# Patient Record
Sex: Female | Born: 1950
Health system: Southern US, Community
[De-identification: ages and names within clinical notes are randomized; demographics above are authoritative.]

## PROBLEM LIST (undated history)

## (undated) DIAGNOSIS — G5600 Carpal tunnel syndrome, unspecified upper limb: Secondary | ICD-10-CM

## (undated) DIAGNOSIS — C50919 Malignant neoplasm of unspecified site of unspecified female breast: Secondary | ICD-10-CM

## (undated) DIAGNOSIS — Z9889 Other specified postprocedural states: Secondary | ICD-10-CM

## (undated) DIAGNOSIS — I10 Essential (primary) hypertension: Secondary | ICD-10-CM

## (undated) DIAGNOSIS — R7301 Impaired fasting glucose: Secondary | ICD-10-CM

## (undated) DIAGNOSIS — Z923 Personal history of irradiation: Secondary | ICD-10-CM

## (undated) DIAGNOSIS — Z9221 Personal history of antineoplastic chemotherapy: Secondary | ICD-10-CM

## (undated) DIAGNOSIS — Z22322 Carrier or suspected carrier of Methicillin resistant Staphylococcus aureus: Secondary | ICD-10-CM

## (undated) DIAGNOSIS — E785 Hyperlipidemia, unspecified: Secondary | ICD-10-CM

## (undated) DIAGNOSIS — M7989 Other specified soft tissue disorders: Secondary | ICD-10-CM

## (undated) DIAGNOSIS — Z853 Personal history of malignant neoplasm of breast: Secondary | ICD-10-CM

## (undated) DIAGNOSIS — R112 Nausea with vomiting, unspecified: Secondary | ICD-10-CM

## (undated) DIAGNOSIS — R413 Other amnesia: Secondary | ICD-10-CM

## (undated) DIAGNOSIS — M1990 Unspecified osteoarthritis, unspecified site: Secondary | ICD-10-CM

## (undated) DIAGNOSIS — M8580 Other specified disorders of bone density and structure, unspecified site: Secondary | ICD-10-CM

## (undated) HISTORY — DX: Malignant neoplasm of unspecified site of unspecified female breast: C50.919

## (undated) HISTORY — DX: Hyperlipidemia, unspecified: E78.5

## (undated) HISTORY — DX: Carpal tunnel syndrome, unspecified upper limb: G56.00

## (undated) HISTORY — DX: Essential (primary) hypertension: I10

## (undated) HISTORY — DX: Other specified disorders of bone density and structure, unspecified site: M85.80

## (undated) HISTORY — DX: Personal history of malignant neoplasm of breast: Z85.3

## (undated) HISTORY — DX: Other amnesia: R41.3

## (undated) HISTORY — PX: BREAST LUMPECTOMY: SHX2

## (undated) HISTORY — DX: Impaired fasting glucose: R73.01

---

## 1995-06-18 DIAGNOSIS — Z923 Personal history of irradiation: Secondary | ICD-10-CM

## 1995-06-18 DIAGNOSIS — Z9221 Personal history of antineoplastic chemotherapy: Secondary | ICD-10-CM

## 1995-06-18 DIAGNOSIS — Z9889 Other specified postprocedural states: Secondary | ICD-10-CM

## 1995-06-18 DIAGNOSIS — R112 Nausea with vomiting, unspecified: Secondary | ICD-10-CM

## 1995-06-18 HISTORY — DX: Nausea with vomiting, unspecified: R11.2

## 1995-06-18 HISTORY — DX: Personal history of irradiation: Z92.3

## 1995-06-18 HISTORY — DX: Personal history of antineoplastic chemotherapy: Z92.21

## 1995-06-18 HISTORY — DX: Other specified postprocedural states: Z98.890

## 1997-09-26 ENCOUNTER — Other Ambulatory Visit: Admission: RE | Admit: 1997-09-26 | Discharge: 1997-09-26 | Payer: Self-pay | Admitting: Obstetrics and Gynecology

## 1998-06-17 HISTORY — PX: KNEE SURGERY: SHX244

## 1998-09-20 ENCOUNTER — Other Ambulatory Visit: Admission: RE | Admit: 1998-09-20 | Discharge: 1998-09-20 | Payer: Self-pay | Admitting: Obstetrics and Gynecology

## 1998-09-28 ENCOUNTER — Other Ambulatory Visit: Admission: RE | Admit: 1998-09-28 | Discharge: 1998-09-28 | Payer: Self-pay | Admitting: Obstetrics and Gynecology

## 1999-02-07 ENCOUNTER — Other Ambulatory Visit: Admission: RE | Admit: 1999-02-07 | Discharge: 1999-02-07 | Payer: Self-pay | Admitting: Orthopedic Surgery

## 1999-04-09 ENCOUNTER — Other Ambulatory Visit: Admission: RE | Admit: 1999-04-09 | Discharge: 1999-04-09 | Payer: Self-pay | Admitting: Obstetrics and Gynecology

## 1999-06-26 ENCOUNTER — Encounter: Payer: Self-pay | Admitting: Oncology

## 1999-06-26 ENCOUNTER — Encounter: Admission: RE | Admit: 1999-06-26 | Discharge: 1999-06-26 | Payer: Self-pay | Admitting: Oncology

## 1999-10-11 ENCOUNTER — Other Ambulatory Visit: Admission: RE | Admit: 1999-10-11 | Discharge: 1999-10-11 | Payer: Self-pay | Admitting: Obstetrics and Gynecology

## 2000-06-17 HISTORY — PX: DILATION AND CURETTAGE, DIAGNOSTIC / THERAPEUTIC: SUR384

## 2000-06-30 ENCOUNTER — Encounter: Payer: Self-pay | Admitting: Oncology

## 2000-06-30 ENCOUNTER — Encounter: Admission: RE | Admit: 2000-06-30 | Discharge: 2000-06-30 | Payer: Self-pay | Admitting: Oncology

## 2000-10-13 ENCOUNTER — Other Ambulatory Visit: Admission: RE | Admit: 2000-10-13 | Discharge: 2000-10-13 | Payer: Self-pay | Admitting: Obstetrics and Gynecology

## 2000-10-15 ENCOUNTER — Encounter (INDEPENDENT_AMBULATORY_CARE_PROVIDER_SITE_OTHER): Payer: Self-pay

## 2000-10-15 ENCOUNTER — Other Ambulatory Visit: Admission: RE | Admit: 2000-10-15 | Discharge: 2000-10-15 | Payer: Self-pay | Admitting: Obstetrics and Gynecology

## 2000-11-18 ENCOUNTER — Ambulatory Visit (HOSPITAL_COMMUNITY): Admission: RE | Admit: 2000-11-18 | Discharge: 2000-11-18 | Payer: Self-pay | Admitting: Obstetrics and Gynecology

## 2000-11-18 ENCOUNTER — Encounter (INDEPENDENT_AMBULATORY_CARE_PROVIDER_SITE_OTHER): Payer: Self-pay

## 2001-07-02 ENCOUNTER — Encounter: Payer: Self-pay | Admitting: Oncology

## 2001-07-02 ENCOUNTER — Encounter: Admission: RE | Admit: 2001-07-02 | Discharge: 2001-07-02 | Payer: Self-pay | Admitting: Oncology

## 2001-10-15 ENCOUNTER — Other Ambulatory Visit: Admission: RE | Admit: 2001-10-15 | Discharge: 2001-10-15 | Payer: Self-pay | Admitting: Obstetrics and Gynecology

## 2002-07-05 ENCOUNTER — Encounter: Admission: RE | Admit: 2002-07-05 | Discharge: 2002-07-05 | Payer: Self-pay | Admitting: Obstetrics and Gynecology

## 2002-07-05 ENCOUNTER — Encounter: Payer: Self-pay | Admitting: Obstetrics and Gynecology

## 2003-06-18 HISTORY — PX: COLONOSCOPY: SHX174

## 2003-07-07 ENCOUNTER — Encounter: Admission: RE | Admit: 2003-07-07 | Discharge: 2003-07-07 | Payer: Self-pay | Admitting: Obstetrics and Gynecology

## 2004-07-20 ENCOUNTER — Encounter: Admission: RE | Admit: 2004-07-20 | Discharge: 2004-07-20 | Payer: Self-pay | Admitting: Obstetrics and Gynecology

## 2005-05-03 ENCOUNTER — Ambulatory Visit: Payer: Self-pay | Admitting: Internal Medicine

## 2005-07-30 ENCOUNTER — Encounter: Admission: RE | Admit: 2005-07-30 | Discharge: 2005-07-30 | Payer: Self-pay | Admitting: Obstetrics and Gynecology

## 2006-02-04 ENCOUNTER — Ambulatory Visit: Payer: Self-pay | Admitting: Internal Medicine

## 2006-02-14 ENCOUNTER — Ambulatory Visit: Payer: Self-pay | Admitting: Family Medicine

## 2006-03-03 ENCOUNTER — Ambulatory Visit: Payer: Self-pay | Admitting: Internal Medicine

## 2006-04-02 ENCOUNTER — Ambulatory Visit: Payer: Self-pay | Admitting: Internal Medicine

## 2006-08-01 ENCOUNTER — Encounter: Admission: RE | Admit: 2006-08-01 | Discharge: 2006-08-01 | Payer: Self-pay | Admitting: Obstetrics and Gynecology

## 2006-10-23 ENCOUNTER — Ambulatory Visit: Payer: Self-pay | Admitting: Internal Medicine

## 2006-10-27 ENCOUNTER — Encounter: Payer: Self-pay | Admitting: Internal Medicine

## 2007-02-05 ENCOUNTER — Encounter: Payer: Self-pay | Admitting: Internal Medicine

## 2007-04-01 ENCOUNTER — Ambulatory Visit: Payer: Self-pay | Admitting: Internal Medicine

## 2007-08-03 ENCOUNTER — Encounter: Admission: RE | Admit: 2007-08-03 | Discharge: 2007-08-03 | Payer: Self-pay | Admitting: Internal Medicine

## 2007-10-12 ENCOUNTER — Ambulatory Visit: Payer: Self-pay | Admitting: Internal Medicine

## 2007-10-12 DIAGNOSIS — R030 Elevated blood-pressure reading, without diagnosis of hypertension: Secondary | ICD-10-CM | POA: Insufficient documentation

## 2008-03-10 ENCOUNTER — Ambulatory Visit: Payer: Self-pay | Admitting: Internal Medicine

## 2008-07-15 ENCOUNTER — Encounter: Payer: Self-pay | Admitting: Internal Medicine

## 2008-07-15 ENCOUNTER — Ambulatory Visit: Payer: Self-pay | Admitting: Internal Medicine

## 2008-07-25 ENCOUNTER — Encounter (INDEPENDENT_AMBULATORY_CARE_PROVIDER_SITE_OTHER): Payer: Self-pay | Admitting: *Deleted

## 2008-08-09 ENCOUNTER — Encounter: Admission: RE | Admit: 2008-08-09 | Discharge: 2008-08-09 | Payer: Self-pay | Admitting: Obstetrics and Gynecology

## 2008-08-24 ENCOUNTER — Ambulatory Visit: Payer: Self-pay | Admitting: Internal Medicine

## 2008-08-24 DIAGNOSIS — M1990 Unspecified osteoarthritis, unspecified site: Secondary | ICD-10-CM

## 2008-08-24 DIAGNOSIS — E785 Hyperlipidemia, unspecified: Secondary | ICD-10-CM | POA: Insufficient documentation

## 2008-08-24 DIAGNOSIS — M8580 Other specified disorders of bone density and structure, unspecified site: Secondary | ICD-10-CM | POA: Insufficient documentation

## 2008-08-24 DIAGNOSIS — Z853 Personal history of malignant neoplasm of breast: Secondary | ICD-10-CM | POA: Insufficient documentation

## 2008-08-24 HISTORY — DX: Unspecified osteoarthritis, unspecified site: M19.90

## 2008-08-29 ENCOUNTER — Encounter (INDEPENDENT_AMBULATORY_CARE_PROVIDER_SITE_OTHER): Payer: Self-pay | Admitting: *Deleted

## 2008-09-06 ENCOUNTER — Encounter (INDEPENDENT_AMBULATORY_CARE_PROVIDER_SITE_OTHER): Payer: Self-pay | Admitting: *Deleted

## 2008-09-06 ENCOUNTER — Ambulatory Visit: Payer: Self-pay | Admitting: Internal Medicine

## 2008-09-06 LAB — CONVERTED CEMR LAB: OCCULT 3: NEGATIVE

## 2009-04-09 ENCOUNTER — Emergency Department (HOSPITAL_COMMUNITY): Admission: EM | Admit: 2009-04-09 | Discharge: 2009-04-09 | Payer: Self-pay | Admitting: Emergency Medicine

## 2009-04-12 ENCOUNTER — Ambulatory Visit: Payer: Self-pay | Admitting: Internal Medicine

## 2009-04-12 DIAGNOSIS — M542 Cervicalgia: Secondary | ICD-10-CM

## 2009-04-12 DIAGNOSIS — M545 Low back pain, unspecified: Secondary | ICD-10-CM | POA: Insufficient documentation

## 2009-04-12 HISTORY — DX: Low back pain, unspecified: M54.50

## 2009-04-12 HISTORY — DX: Cervicalgia: M54.2

## 2009-08-10 ENCOUNTER — Encounter: Admission: RE | Admit: 2009-08-10 | Discharge: 2009-08-10 | Payer: Self-pay | Admitting: Obstetrics and Gynecology

## 2009-08-14 ENCOUNTER — Encounter: Admission: RE | Admit: 2009-08-14 | Discharge: 2009-09-20 | Payer: Self-pay | Admitting: Family Medicine

## 2010-06-17 DIAGNOSIS — M7989 Other specified soft tissue disorders: Secondary | ICD-10-CM

## 2010-06-17 DIAGNOSIS — R7301 Impaired fasting glucose: Secondary | ICD-10-CM

## 2010-06-17 DIAGNOSIS — Z22322 Carrier or suspected carrier of Methicillin resistant Staphylococcus aureus: Secondary | ICD-10-CM

## 2010-06-17 HISTORY — DX: Carrier or suspected carrier of methicillin resistant Staphylococcus aureus: Z22.322

## 2010-06-17 HISTORY — PX: BREAST LUMPECTOMY: SHX2

## 2010-06-17 HISTORY — PX: AXILLARY NODE DISSECTION: SHX1211

## 2010-06-17 HISTORY — DX: Impaired fasting glucose: R73.01

## 2010-06-17 HISTORY — DX: Other specified soft tissue disorders: M79.89

## 2010-06-17 HISTORY — PX: BREAST SURGERY: SHX581

## 2010-06-17 HISTORY — PX: BREAST BIOPSY: SHX20

## 2010-07-10 ENCOUNTER — Other Ambulatory Visit: Payer: Self-pay | Admitting: Obstetrics and Gynecology

## 2010-07-15 LAB — CONVERTED CEMR LAB
AST: 26 units/L (ref 0–37)
Albumin: 4.3 g/dL (ref 3.5–5.2)
Alkaline Phosphatase: 82 units/L (ref 39–117)
BUN: 16 mg/dL (ref 6–23)
Chloride: 103 meq/L (ref 96–112)
Direct LDL: 141.1 mg/dL
Eosinophils Relative: 2 % (ref 0.0–5.0)
Glucose, Bld: 98 mg/dL (ref 70–99)
HDL: 80.5 mg/dL (ref >39.0)
Lymphocytes Relative: 22.3 % (ref 12.0–46.0)
MCHC: 34.7 g/dL (ref 30.0–36.0)
MCV: 92.3 fL (ref 78.0–100.0)
Neutro Abs: 4.6 10*3/uL (ref 1.4–7.7)
Neutrophils Relative %: 68.1 % (ref 43.0–77.0)
Platelets: 231 10*3/uL (ref 150–400)
Potassium: 4.1 meq/L (ref 3.5–5.1)
Sodium: 141 meq/L (ref 135–145)
Total CHOL/HDL Ratio: 3.2
Total Protein: 7 g/dL (ref 6.0–8.3)
VLDL: 16 mg/dL (ref 0–40)
WBC: 6.7 10*3/uL (ref 4.5–10.5)

## 2010-08-02 ENCOUNTER — Other Ambulatory Visit: Payer: Self-pay | Admitting: Obstetrics and Gynecology

## 2010-08-02 DIAGNOSIS — N6320 Unspecified lump in the left breast, unspecified quadrant: Secondary | ICD-10-CM

## 2010-08-03 ENCOUNTER — Other Ambulatory Visit: Payer: Self-pay | Admitting: Obstetrics and Gynecology

## 2010-08-13 ENCOUNTER — Other Ambulatory Visit: Payer: Self-pay | Admitting: Diagnostic Radiology

## 2010-08-13 ENCOUNTER — Ambulatory Visit
Admission: RE | Admit: 2010-08-13 | Discharge: 2010-08-13 | Disposition: A | Discharge status: Home / Self Care | Payer: BC Managed Care – PPO | Source: Ambulatory Visit | Attending: Obstetrics and Gynecology | Admitting: Obstetrics and Gynecology

## 2010-08-13 ENCOUNTER — Other Ambulatory Visit: Payer: Self-pay | Admitting: Obstetrics and Gynecology

## 2010-08-13 ENCOUNTER — Encounter: Payer: Self-pay | Admitting: Internal Medicine

## 2010-08-13 DIAGNOSIS — N6320 Unspecified lump in the left breast, unspecified quadrant: Secondary | ICD-10-CM

## 2010-08-14 ENCOUNTER — Other Ambulatory Visit: Payer: Self-pay | Admitting: Obstetrics and Gynecology

## 2010-08-14 DIAGNOSIS — C50912 Malignant neoplasm of unspecified site of left female breast: Secondary | ICD-10-CM

## 2010-08-16 HISTORY — PX: AXILLARY NODE DISSECTION: SHX1211

## 2010-08-17 ENCOUNTER — Ambulatory Visit
Admission: RE | Admit: 2010-08-17 | Discharge: 2010-08-17 | Disposition: A | Discharge status: Home / Self Care | Payer: BC Managed Care – PPO | Source: Ambulatory Visit | Attending: Obstetrics and Gynecology | Admitting: Obstetrics and Gynecology

## 2010-08-17 ENCOUNTER — Other Ambulatory Visit (HOSPITAL_COMMUNITY): Payer: Self-pay | Admitting: Surgery

## 2010-08-17 DIAGNOSIS — C50919 Malignant neoplasm of unspecified site of unspecified female breast: Secondary | ICD-10-CM

## 2010-08-17 DIAGNOSIS — C50912 Malignant neoplasm of unspecified site of left female breast: Secondary | ICD-10-CM

## 2010-08-17 MED ORDER — GADOBENATE DIMEGLUMINE 529 MG/ML IV SOLN
10.0000 mL | Freq: Once | INTRAVENOUS | Status: AC | PRN
  Administered 2010-08-17: 10 mL via INTRAVENOUS

## 2010-08-20 ENCOUNTER — Encounter: Payer: BC Managed Care – PPO | Admitting: Oncology

## 2010-08-20 ENCOUNTER — Other Ambulatory Visit: Payer: Self-pay | Admitting: Obstetrics and Gynecology

## 2010-08-20 ENCOUNTER — Encounter (HOSPITAL_BASED_OUTPATIENT_CLINIC_OR_DEPARTMENT_OTHER): Payer: BC Managed Care – PPO | Admitting: Oncology

## 2010-08-20 DIAGNOSIS — C50919 Malignant neoplasm of unspecified site of unspecified female breast: Secondary | ICD-10-CM

## 2010-08-20 DIAGNOSIS — R928 Other abnormal and inconclusive findings on diagnostic imaging of breast: Secondary | ICD-10-CM

## 2010-08-20 DIAGNOSIS — C773 Secondary and unspecified malignant neoplasm of axilla and upper limb lymph nodes: Secondary | ICD-10-CM

## 2010-08-22 ENCOUNTER — Ambulatory Visit
Admission: RE | Admit: 2010-08-22 | Discharge: 2010-08-22 | Disposition: A | Discharge status: Home / Self Care | Payer: BC Managed Care – PPO | Source: Ambulatory Visit | Attending: Obstetrics and Gynecology | Admitting: Obstetrics and Gynecology

## 2010-08-22 ENCOUNTER — Other Ambulatory Visit: Payer: Self-pay | Admitting: Obstetrics and Gynecology

## 2010-08-22 DIAGNOSIS — R928 Other abnormal and inconclusive findings on diagnostic imaging of breast: Secondary | ICD-10-CM

## 2010-08-23 ENCOUNTER — Encounter (HOSPITAL_COMMUNITY)
Admission: RE | Admit: 2010-08-23 | Discharge: 2010-08-23 | Disposition: A | Discharge status: Home / Self Care | Payer: BC Managed Care – PPO | Source: Ambulatory Visit | Attending: Surgery | Admitting: Surgery

## 2010-08-23 ENCOUNTER — Encounter (HOSPITAL_COMMUNITY): Payer: Self-pay

## 2010-08-23 DIAGNOSIS — C50919 Malignant neoplasm of unspecified site of unspecified female breast: Secondary | ICD-10-CM | POA: Insufficient documentation

## 2010-08-23 LAB — GLUCOSE, CAPILLARY: Glucose-Capillary: 108 mg/dL — ABNORMAL HIGH (ref 70–99)

## 2010-08-23 MED ORDER — FLUDEOXYGLUCOSE F - 18 (FDG) INJECTION
17.1000 | Freq: Once | INTRAVENOUS | Status: AC | PRN
  Administered 2010-08-23: 17.1 via INTRAVENOUS

## 2010-08-30 ENCOUNTER — Other Ambulatory Visit: Payer: Self-pay | Admitting: Diagnostic Radiology

## 2010-08-30 ENCOUNTER — Ambulatory Visit
Admission: RE | Admit: 2010-08-30 | Discharge: 2010-08-30 | Disposition: A | Discharge status: Home / Self Care | Payer: BC Managed Care – PPO | Source: Ambulatory Visit | Attending: Obstetrics and Gynecology | Admitting: Obstetrics and Gynecology

## 2010-08-30 DIAGNOSIS — R928 Other abnormal and inconclusive findings on diagnostic imaging of breast: Secondary | ICD-10-CM

## 2010-09-03 ENCOUNTER — Other Ambulatory Visit: Payer: BC Managed Care – PPO

## 2010-09-06 ENCOUNTER — Encounter (HOSPITAL_BASED_OUTPATIENT_CLINIC_OR_DEPARTMENT_OTHER): Payer: BC Managed Care – PPO | Admitting: Oncology

## 2010-09-06 DIAGNOSIS — C773 Secondary and unspecified malignant neoplasm of axilla and upper limb lymph nodes: Secondary | ICD-10-CM

## 2010-09-06 DIAGNOSIS — C50919 Malignant neoplasm of unspecified site of unspecified female breast: Secondary | ICD-10-CM

## 2010-09-10 ENCOUNTER — Other Ambulatory Visit: Payer: Self-pay | Admitting: Surgery

## 2010-09-10 ENCOUNTER — Encounter (HOSPITAL_COMMUNITY): Payer: BC Managed Care – PPO

## 2010-09-10 LAB — COMPREHENSIVE METABOLIC PANEL
BUN: 10 mg/dL (ref 6–23)
Calcium: 9.5 mg/dL (ref 8.4–10.5)
Creatinine, Ser: 0.77 mg/dL (ref 0.4–1.2)
Glucose, Bld: 111 mg/dL — ABNORMAL HIGH (ref 70–99)
Total Protein: 7.1 g/dL (ref 6.0–8.3)

## 2010-09-10 LAB — CBC
HCT: 44.3 % (ref 36.0–46.0)
MCHC: 34.3 g/dL (ref 30.0–36.0)
MCV: 90.4 fL (ref 78.0–100.0)
RDW: 12.4 % (ref 11.5–15.5)

## 2010-09-10 LAB — URINALYSIS, ROUTINE W REFLEX MICROSCOPIC
Bilirubin Urine: NEGATIVE
Glucose, UA: NEGATIVE mg/dL
Ketones, ur: NEGATIVE mg/dL
Nitrite: NEGATIVE
Protein, ur: NEGATIVE mg/dL
pH: 7.5 (ref 5.0–8.0)

## 2010-09-10 LAB — CANCER ANTIGEN 27.29: CA 27.29: 44 U/mL — ABNORMAL HIGH (ref 0–39)

## 2010-09-10 LAB — DIFFERENTIAL
Eosinophils Absolute: 0.1 10*3/uL (ref 0.0–0.7)
Eosinophils Relative: 1 % (ref 0–5)
Lymphocytes Relative: 18 % (ref 12–46)
Lymphs Abs: 1.4 10*3/uL (ref 0.7–4.0)
Monocytes Absolute: 0.6 10*3/uL (ref 0.1–1.0)

## 2010-09-10 LAB — SURGICAL PCR SCREEN
MRSA, PCR: NEGATIVE
Staphylococcus aureus: POSITIVE — AB

## 2010-09-11 ENCOUNTER — Encounter: Payer: Self-pay | Admitting: Internal Medicine

## 2010-09-13 ENCOUNTER — Ambulatory Visit (HOSPITAL_COMMUNITY)
Admission: RE | Admit: 2010-09-13 | Discharge: 2010-09-14 | Disposition: A | Discharge status: Home / Self Care | Payer: BC Managed Care – PPO | Source: Ambulatory Visit | Attending: Surgery | Admitting: Surgery

## 2010-09-13 ENCOUNTER — Other Ambulatory Visit: Payer: Self-pay | Admitting: Surgery

## 2010-09-13 DIAGNOSIS — Z01810 Encounter for preprocedural cardiovascular examination: Secondary | ICD-10-CM | POA: Insufficient documentation

## 2010-09-13 DIAGNOSIS — Z01812 Encounter for preprocedural laboratory examination: Secondary | ICD-10-CM | POA: Insufficient documentation

## 2010-09-13 DIAGNOSIS — C50919 Malignant neoplasm of unspecified site of unspecified female breast: Secondary | ICD-10-CM | POA: Insufficient documentation

## 2010-09-13 DIAGNOSIS — C773 Secondary and unspecified malignant neoplasm of axilla and upper limb lymph nodes: Secondary | ICD-10-CM | POA: Insufficient documentation

## 2010-09-14 NOTE — Op Note (Signed)
NAMELILLAR, Shirley NO.:  [id number]  MEDICAL RECORD NO.:  [id number]           PATIENT TYPE:  O  LOCATION:  1309                         FACILITY:  [facility]  PHYSICIAN:  Currie Paris, M.D.DATE OF BIRTH:  Dec 16, 1950  DATE OF PROCEDURE:  09/13/2010 DATE OF DISCHARGE:                              OPERATIVE REPORT   PREOPERATIVE DIAGNOSIS:  Left axillary metastases secondary to breast cancer.  POSTOPERATIVE DIAGNOSIS:  Left axillary metastases secondary to breast cancer.  PROCEDURE:  Complete left axillary node lymph node dissection.  SURGEON:  Currie Paris, M.D.  ASSISTANT:  Anselm Pancoast. Zachery Dakins, M.D.  ANESTHESIA:  General.  CLINICAL HISTORY:  This is a 60 year old lady who underwent lumpectomy and chemoradiation for a left breast cancer in 1997.  She did not have any axillary therapy other than what area the radiation fields covered.  She recently presented with a mass in the left axilla and workup has shown that she has metastatic axillary disease with no other obvious disease present.  After discussion with the patient and consultation with her oncologist, she elected to proceed with a total left axillary node lymph node dissection.  DESCRIPTION OF PROCEDURE:  I saw the patient in the holding area and reviewed the plans for the procedure with the patient.  She had no further questions.  I initialed the left side as the operative side.  The patient was taken to the operating room.  After satisfactory general (LMA) anesthesia had been obtained, the left axillary area was prepped and draped as a sterile field and the time-out done.  There was an axillary mass palpable somewhat anterior and low.  I made a transverse axillary incision just inferior to the mass.  I divided the tissue down to the chest wall, coming around the mass and freeing it up. It did not appear to be stuck to either skin or subcutaneous tissue, but did not really have  a good capsule around it.  I could palpate other enlarged nodes.  I opened up the tissue and freed the extra tissue off the inferior aspect of the latissimus dorsi muscle.  I then opened up the clavipectoral fascia and began removing the axillary tissue from that area.  When I got up to where I could see the nerve and blood supply to the pectoralis, I began looking for the axillary vein and found that.  I then was able to strip the axillary contents out from medial to lateral and then superior to inferior.  I identified and preserved both the thoracodorsal and long thoracic nerves, taking out the intervening tissue.  The lateral attachments were divided with cautery.  I used clips for most of the blood vessels.  I did take the second intercostal nerve at its origin coming out of the chest wall and using the clips for hemostasis.  Once the axillary contents were completely removed, I irrigated and made sure everything was dry.  I pinched the long thoracic and thoracodorsal nerves to make sure they still functioned and they seemed to work okay.  I put a 19-Blake drain in and secured it with  2-0 nylon.  I injected 0.25% plain Marcaine around the two nerves and then into the skin and subcutaneous tissues.  I closed with 3-0 Vicryl, 4-0 Monocryl, subcuticular and Dermabond.  The patient tolerated the procedure well and there were no complications.  All counts were correct.     Currie Paris, M.D.     CJS/MEDQ  D:  09/13/2010  T:  09/13/2010  Job:  [id number]  cc:   Titus Dubin. Alwyn Ren, MD,FACP,FCCP [phone] [address] [place] [address]  G. Rolm Baptise, M.D. Fax: [phone]  Electronically Signed by Cyndia Bent M.D. on 09/14/2010 07:25:58 AM

## 2010-09-19 ENCOUNTER — Ambulatory Visit: Payer: BC Managed Care – PPO | Attending: Radiation Oncology | Admitting: Radiation Oncology

## 2010-09-19 DIAGNOSIS — Z923 Personal history of irradiation: Secondary | ICD-10-CM | POA: Insufficient documentation

## 2010-09-19 DIAGNOSIS — C773 Secondary and unspecified malignant neoplasm of axilla and upper limb lymph nodes: Secondary | ICD-10-CM | POA: Insufficient documentation

## 2010-09-19 DIAGNOSIS — Z9221 Personal history of antineoplastic chemotherapy: Secondary | ICD-10-CM | POA: Insufficient documentation

## 2010-09-19 DIAGNOSIS — Z853 Personal history of malignant neoplasm of breast: Secondary | ICD-10-CM | POA: Insufficient documentation

## 2010-09-19 DIAGNOSIS — Z170 Estrogen receptor positive status [ER+]: Secondary | ICD-10-CM | POA: Insufficient documentation

## 2010-09-24 ENCOUNTER — Encounter (HOSPITAL_BASED_OUTPATIENT_CLINIC_OR_DEPARTMENT_OTHER): Payer: BC Managed Care – PPO | Admitting: Oncology

## 2010-09-24 DIAGNOSIS — C773 Secondary and unspecified malignant neoplasm of axilla and upper limb lymph nodes: Secondary | ICD-10-CM

## 2010-09-24 DIAGNOSIS — C50919 Malignant neoplasm of unspecified site of unspecified female breast: Secondary | ICD-10-CM

## 2010-10-04 ENCOUNTER — Encounter (HOSPITAL_BASED_OUTPATIENT_CLINIC_OR_DEPARTMENT_OTHER): Payer: BC Managed Care – PPO | Admitting: Oncology

## 2010-10-04 DIAGNOSIS — C773 Secondary and unspecified malignant neoplasm of axilla and upper limb lymph nodes: Secondary | ICD-10-CM

## 2010-10-04 DIAGNOSIS — C50919 Malignant neoplasm of unspecified site of unspecified female breast: Secondary | ICD-10-CM

## 2010-10-16 ENCOUNTER — Other Ambulatory Visit: Payer: Self-pay | Admitting: Obstetrics and Gynecology

## 2010-11-02 NOTE — Op Note (Signed)
[facility]  Patient:    Shirley Melendez, Shirley Melendez                       MRN: [id number] Proc. Date: 11/18/00 Adm. Date:  [id number] Attending:  Rosalee Kaufman                           Operative Report  PREOPERATIVE DIAGNOSIS:  History of vaginal discharge associated with an endometrial prolapsed polyp and history of tamoxifen therapy for four and one-half years.  POSTOPERATIVE DIAGNOSIS:  History of vaginal discharge associated with an endometrial prolapsed polyp and history of tamoxifen therapy for four and one-half years.  OPERATION:  Dilatation and curettage.  SURGEON:  Harl Bowie, M.D.  ANESTHESIA:  MAC with local.  FINDINGS AND PROCEDURE:  Patient prepped and draped in usual fashion for vaginal procedure.  The patient was examined and found uterus normal size in a posterior position, adnexa clear.  The endometrial polyp had been previously removed, and the cervix appeared normal.  Following this, the cervix was grasped with a single-tooth tenaculum.  The cervix was sounded to 2-1/2 inches in a posterior manner.  Cervix was dilated and the cavity entered with sharp curet.  Only a small amount of tissue was obtained.  No additional tissue was obtained with the Randall Stone forceps and a serrated curet.  Blood loss during the procedure was minimal.  The patient tolerated the procedure well and was sent to the recovery room in good condition. DD:  11/18/00 TD:  11/18/00 Job: [id number]

## 2010-11-07 ENCOUNTER — Other Ambulatory Visit: Payer: Self-pay | Admitting: Oncology

## 2010-11-07 ENCOUNTER — Encounter (HOSPITAL_BASED_OUTPATIENT_CLINIC_OR_DEPARTMENT_OTHER): Payer: BC Managed Care – PPO | Admitting: Oncology

## 2010-11-07 DIAGNOSIS — C50919 Malignant neoplasm of unspecified site of unspecified female breast: Secondary | ICD-10-CM

## 2010-11-07 DIAGNOSIS — Z5111 Encounter for antineoplastic chemotherapy: Secondary | ICD-10-CM

## 2010-11-07 DIAGNOSIS — C773 Secondary and unspecified malignant neoplasm of axilla and upper limb lymph nodes: Secondary | ICD-10-CM

## 2010-11-07 LAB — CBC WITH DIFFERENTIAL/PLATELET
Eosinophils Absolute: 0.1 10*3/uL (ref 0.0–0.5)
MCV: 91.6 fL (ref 79.5–101.0)
MONO#: 0.6 10*3/uL (ref 0.1–0.9)
MONO%: 6.8 % (ref 0.0–14.0)
NEUT#: 6 10*3/uL (ref 1.5–6.5)
RBC: 4.89 10*6/uL (ref 3.70–5.45)
RDW: 12.1 % (ref 11.2–14.5)
WBC: 8.3 10*3/uL (ref 3.9–10.3)

## 2010-11-07 LAB — COMPREHENSIVE METABOLIC PANEL
Albumin: 4.1 g/dL (ref 3.5–5.2)
Alkaline Phosphatase: 90 U/L (ref 39–117)
Glucose, Bld: 121 mg/dL — ABNORMAL HIGH (ref 70–99)
Potassium: 3.7 mEq/L (ref 3.5–5.3)
Sodium: 138 mEq/L (ref 135–145)
Total Protein: 7 g/dL (ref 6.0–8.3)

## 2010-11-08 ENCOUNTER — Encounter (HOSPITAL_BASED_OUTPATIENT_CLINIC_OR_DEPARTMENT_OTHER): Payer: BC Managed Care – PPO | Admitting: Oncology

## 2010-11-08 DIAGNOSIS — C773 Secondary and unspecified malignant neoplasm of axilla and upper limb lymph nodes: Secondary | ICD-10-CM

## 2010-11-08 DIAGNOSIS — C50919 Malignant neoplasm of unspecified site of unspecified female breast: Secondary | ICD-10-CM

## 2010-11-08 DIAGNOSIS — Z5189 Encounter for other specified aftercare: Secondary | ICD-10-CM

## 2010-11-16 ENCOUNTER — Encounter (HOSPITAL_BASED_OUTPATIENT_CLINIC_OR_DEPARTMENT_OTHER): Payer: BC Managed Care – PPO | Admitting: Oncology

## 2010-11-16 ENCOUNTER — Other Ambulatory Visit: Payer: Self-pay | Admitting: Oncology

## 2010-11-16 DIAGNOSIS — C773 Secondary and unspecified malignant neoplasm of axilla and upper limb lymph nodes: Secondary | ICD-10-CM

## 2010-11-16 DIAGNOSIS — C50919 Malignant neoplasm of unspecified site of unspecified female breast: Secondary | ICD-10-CM

## 2010-11-16 LAB — CBC WITH DIFFERENTIAL/PLATELET
BASO%: 0.4 % (ref 0.0–2.0)
EOS%: 0.2 % (ref 0.0–7.0)
LYMPH%: 9.7 % — ABNORMAL LOW (ref 14.0–49.7)
MCHC: 35.3 g/dL (ref 31.5–36.0)
MCV: 88.2 fL (ref 79.5–101.0)
MONO%: 5.5 % (ref 0.0–14.0)
Platelets: 271 10*3/uL (ref 145–400)
RBC: 4.59 10*6/uL (ref 3.70–5.45)
WBC: 30.1 10*3/uL — ABNORMAL HIGH (ref 3.9–10.3)

## 2010-11-26 ENCOUNTER — Encounter (HOSPITAL_BASED_OUTPATIENT_CLINIC_OR_DEPARTMENT_OTHER): Payer: BC Managed Care – PPO | Admitting: Oncology

## 2010-11-26 ENCOUNTER — Other Ambulatory Visit: Payer: Self-pay | Admitting: Oncology

## 2010-11-26 DIAGNOSIS — C50919 Malignant neoplasm of unspecified site of unspecified female breast: Secondary | ICD-10-CM

## 2010-11-26 DIAGNOSIS — C773 Secondary and unspecified malignant neoplasm of axilla and upper limb lymph nodes: Secondary | ICD-10-CM

## 2010-11-26 DIAGNOSIS — Z5111 Encounter for antineoplastic chemotherapy: Secondary | ICD-10-CM

## 2010-11-26 LAB — COMPREHENSIVE METABOLIC PANEL
ALT: 20 U/L (ref 0–35)
AST: 15 U/L (ref 0–37)
CO2: 25 mEq/L (ref 19–32)
Calcium: 9.5 mg/dL (ref 8.4–10.5)
Chloride: 102 mEq/L (ref 96–112)
Sodium: 136 mEq/L (ref 135–145)
Total Bilirubin: 0.5 mg/dL (ref 0.3–1.2)
Total Protein: 6.2 g/dL (ref 6.0–8.3)

## 2010-11-26 LAB — CBC WITH DIFFERENTIAL/PLATELET
BASO%: 0.1 % (ref 0.0–2.0)
MCHC: 35.2 g/dL (ref 31.5–36.0)
MONO#: 0.8 10*3/uL (ref 0.1–0.9)
RBC: 4.47 10*6/uL (ref 3.70–5.45)
WBC: 15.2 10*3/uL — ABNORMAL HIGH (ref 3.9–10.3)
lymph#: 1.2 10*3/uL (ref 0.9–3.3)

## 2010-11-27 ENCOUNTER — Encounter (HOSPITAL_BASED_OUTPATIENT_CLINIC_OR_DEPARTMENT_OTHER): Payer: BC Managed Care – PPO | Admitting: Oncology

## 2010-11-27 DIAGNOSIS — Z5189 Encounter for other specified aftercare: Secondary | ICD-10-CM

## 2010-11-27 DIAGNOSIS — C773 Secondary and unspecified malignant neoplasm of axilla and upper limb lymph nodes: Secondary | ICD-10-CM

## 2010-11-27 DIAGNOSIS — C50919 Malignant neoplasm of unspecified site of unspecified female breast: Secondary | ICD-10-CM

## 2010-12-17 ENCOUNTER — Other Ambulatory Visit: Payer: Self-pay | Admitting: Oncology

## 2010-12-17 ENCOUNTER — Encounter (HOSPITAL_BASED_OUTPATIENT_CLINIC_OR_DEPARTMENT_OTHER): Payer: BC Managed Care – PPO | Admitting: Oncology

## 2010-12-17 DIAGNOSIS — C50919 Malignant neoplasm of unspecified site of unspecified female breast: Secondary | ICD-10-CM

## 2010-12-17 DIAGNOSIS — C773 Secondary and unspecified malignant neoplasm of axilla and upper limb lymph nodes: Secondary | ICD-10-CM

## 2010-12-17 DIAGNOSIS — Z5111 Encounter for antineoplastic chemotherapy: Secondary | ICD-10-CM

## 2010-12-17 DIAGNOSIS — Z5189 Encounter for other specified aftercare: Secondary | ICD-10-CM

## 2010-12-17 LAB — CBC WITH DIFFERENTIAL/PLATELET
Basophils Absolute: 0 10*3/uL (ref 0.0–0.1)
Eosinophils Absolute: 0 10*3/uL (ref 0.0–0.5)
MCV: 89.7 fL (ref 79.5–101.0)
MONO%: 9.2 % (ref 0.0–14.0)
NEUT%: 82.4 % — ABNORMAL HIGH (ref 38.4–76.8)
RDW: 14.6 % — ABNORMAL HIGH (ref 11.2–14.5)
WBC: 17 10*3/uL — ABNORMAL HIGH (ref 3.9–10.3)
lymph#: 1.4 10*3/uL (ref 0.9–3.3)

## 2010-12-17 LAB — COMPREHENSIVE METABOLIC PANEL
Albumin: 3.8 g/dL (ref 3.5–5.2)
BUN: 19 mg/dL (ref 6–23)
Calcium: 9.9 mg/dL (ref 8.4–10.5)
Chloride: 103 mEq/L (ref 96–112)
Glucose, Bld: 102 mg/dL — ABNORMAL HIGH (ref 70–99)
Potassium: 3.5 mEq/L (ref 3.5–5.3)

## 2010-12-18 ENCOUNTER — Encounter (HOSPITAL_BASED_OUTPATIENT_CLINIC_OR_DEPARTMENT_OTHER): Payer: BC Managed Care – PPO | Admitting: Oncology

## 2010-12-18 DIAGNOSIS — C50919 Malignant neoplasm of unspecified site of unspecified female breast: Secondary | ICD-10-CM

## 2010-12-18 DIAGNOSIS — C773 Secondary and unspecified malignant neoplasm of axilla and upper limb lymph nodes: Secondary | ICD-10-CM

## 2010-12-18 DIAGNOSIS — Z5111 Encounter for antineoplastic chemotherapy: Secondary | ICD-10-CM

## 2010-12-24 ENCOUNTER — Ambulatory Visit: Payer: BC Managed Care – PPO | Attending: Internal Medicine | Admitting: Physical Therapy

## 2010-12-24 DIAGNOSIS — I890 Lymphedema, not elsewhere classified: Secondary | ICD-10-CM | POA: Insufficient documentation

## 2010-12-24 DIAGNOSIS — IMO0001 Reserved for inherently not codable concepts without codable children: Secondary | ICD-10-CM | POA: Insufficient documentation

## 2010-12-26 ENCOUNTER — Ambulatory Visit: Payer: BC Managed Care – PPO | Admitting: Physical Therapy

## 2010-12-31 ENCOUNTER — Ambulatory Visit: Payer: BC Managed Care – PPO | Admitting: Physical Therapy

## 2011-01-02 ENCOUNTER — Ambulatory Visit: Payer: BC Managed Care – PPO | Admitting: Physical Therapy

## 2011-01-04 ENCOUNTER — Ambulatory Visit: Payer: BC Managed Care – PPO | Admitting: Physical Therapy

## 2011-01-07 ENCOUNTER — Encounter (HOSPITAL_BASED_OUTPATIENT_CLINIC_OR_DEPARTMENT_OTHER): Payer: BC Managed Care – PPO | Admitting: Oncology

## 2011-01-07 ENCOUNTER — Other Ambulatory Visit: Payer: Self-pay | Admitting: Oncology

## 2011-01-07 DIAGNOSIS — Z5111 Encounter for antineoplastic chemotherapy: Secondary | ICD-10-CM

## 2011-01-07 DIAGNOSIS — C773 Secondary and unspecified malignant neoplasm of axilla and upper limb lymph nodes: Secondary | ICD-10-CM

## 2011-01-07 DIAGNOSIS — C50919 Malignant neoplasm of unspecified site of unspecified female breast: Secondary | ICD-10-CM

## 2011-01-07 LAB — CBC WITH DIFFERENTIAL/PLATELET
BASO%: 0 % (ref 0.0–2.0)
Basophils Absolute: 0 10*3/uL (ref 0.0–0.1)
EOS%: 0 % (ref 0.0–7.0)
Eosinophils Absolute: 0 10*3/uL (ref 0.0–0.5)
HCT: 34.5 % — ABNORMAL LOW (ref 34.8–46.6)
HGB: 11.8 g/dL (ref 11.6–15.9)
LYMPH%: 6.1 % — ABNORMAL LOW (ref 14.0–49.7)
MCH: 32.1 pg (ref 25.1–34.0)
MCHC: 34.2 g/dL (ref 31.5–36.0)
MCV: 93.7 fL (ref 79.5–101.0)
MONO#: 1.1 10*3/uL — ABNORMAL HIGH (ref 0.1–0.9)
MONO%: 7.6 % (ref 0.0–14.0)
NEUT#: 12.4 10*3/uL — ABNORMAL HIGH (ref 1.5–6.5)
NEUT%: 86.3 % — ABNORMAL HIGH (ref 38.4–76.8)
Platelets: 394 10*3/uL (ref 145–400)
RBC: 3.68 10*6/uL — ABNORMAL LOW (ref 3.70–5.45)
RDW: 17.2 % — ABNORMAL HIGH (ref 11.2–14.5)
WBC: 14.4 10*3/uL — ABNORMAL HIGH (ref 3.9–10.3)
lymph#: 0.9 10*3/uL (ref 0.9–3.3)

## 2011-01-07 LAB — COMPREHENSIVE METABOLIC PANEL
ALT: 23 U/L (ref 0–35)
AST: 32 U/L (ref 0–37)
Albumin: 3.5 g/dL (ref 3.5–5.2)
Alkaline Phosphatase: 125 U/L — ABNORMAL HIGH (ref 39–117)
BUN: 16 mg/dL (ref 6–23)
CO2: 25 mEq/L (ref 19–32)
Calcium: 9.7 mg/dL (ref 8.4–10.5)
Chloride: 101 mEq/L (ref 96–112)
Creatinine, Ser: 0.6 mg/dL (ref 0.50–1.10)
Glucose, Bld: 110 mg/dL — ABNORMAL HIGH (ref 70–99)
Potassium: 4 mEq/L (ref 3.5–5.3)
Sodium: 137 mEq/L (ref 135–145)
Total Bilirubin: 0.5 mg/dL (ref 0.3–1.2)
Total Protein: 7.1 g/dL (ref 6.0–8.3)

## 2011-01-08 ENCOUNTER — Encounter (HOSPITAL_BASED_OUTPATIENT_CLINIC_OR_DEPARTMENT_OTHER): Payer: BC Managed Care – PPO | Admitting: Oncology

## 2011-01-08 DIAGNOSIS — C50919 Malignant neoplasm of unspecified site of unspecified female breast: Secondary | ICD-10-CM

## 2011-01-08 DIAGNOSIS — C773 Secondary and unspecified malignant neoplasm of axilla and upper limb lymph nodes: Secondary | ICD-10-CM

## 2011-01-08 DIAGNOSIS — IMO0002 Reserved for concepts with insufficient information to code with codable children: Secondary | ICD-10-CM

## 2011-01-09 ENCOUNTER — Ambulatory Visit: Payer: BC Managed Care – PPO | Admitting: Physical Therapy

## 2011-01-14 ENCOUNTER — Ambulatory Visit: Payer: BC Managed Care – PPO | Admitting: Physical Therapy

## 2011-01-15 ENCOUNTER — Encounter (INDEPENDENT_AMBULATORY_CARE_PROVIDER_SITE_OTHER): Payer: Self-pay | Admitting: Surgery

## 2011-01-15 ENCOUNTER — Ambulatory Visit (INDEPENDENT_AMBULATORY_CARE_PROVIDER_SITE_OTHER): Payer: BC Managed Care – PPO | Admitting: Surgery

## 2011-01-15 VITALS — BP 120/78 | HR 100 | Temp 98.2°F

## 2011-01-15 DIAGNOSIS — Z853 Personal history of malignant neoplasm of breast: Secondary | ICD-10-CM

## 2011-01-15 NOTE — Progress Notes (Signed)
01/15/2011  Shirley Melendez is a 60 y.o.female who presents for routine followup of her Left breast cancerdiagnosed in 1997 and treated with Lumpectomy and radiation.She developed an axillary recurrence this year andAnd she underwent an axillary dissection for that. She has been working with physical therapy on her left shoulder and has got some benefit from that. Overall I think she feels like she is doing okay.  PFSH She has had no significant changes since the last visit here.  ROS There have been no significant changes since the last visit here  General: The patient is alert, oriented, generally healty appearing, NAD. Mood and affect are normal.  Breasts : The right breast was okay. The left breast shows surgical changes in the medial aspect from her lumpectomy many years ago. The axillary area has healed okay although there is very minimal cording. Lymphatics: She has no axillary or supraclavicular adenopathy on either side.  Extremities: Full ROM of the surgical side with no lymphedema noted.  Data Reviewed: No new data Impression: Stable exam Plan: Revisit in three months

## 2011-01-15 NOTE — Patient Instructions (Signed)
I will see you back for breast cancer followup in about three months, after he had finished her radiation therapy. Call me for any questions or problems during that time.

## 2011-01-16 ENCOUNTER — Ambulatory Visit: Payer: BC Managed Care – PPO | Attending: Internal Medicine | Admitting: Physical Therapy

## 2011-01-16 ENCOUNTER — Ambulatory Visit
Admission: RE | Admit: 2011-01-16 | Discharge: 2011-01-16 | Disposition: A | Discharge status: Home / Self Care | Payer: BC Managed Care – PPO | Source: Ambulatory Visit | Attending: Radiation Oncology | Admitting: Radiation Oncology

## 2011-01-16 DIAGNOSIS — I890 Lymphedema, not elsewhere classified: Secondary | ICD-10-CM | POA: Insufficient documentation

## 2011-01-16 DIAGNOSIS — IMO0001 Reserved for inherently not codable concepts without codable children: Secondary | ICD-10-CM | POA: Insufficient documentation

## 2011-01-16 DIAGNOSIS — Z510 Encounter for antineoplastic radiation therapy: Secondary | ICD-10-CM | POA: Insufficient documentation

## 2011-01-16 DIAGNOSIS — R609 Edema, unspecified: Secondary | ICD-10-CM | POA: Insufficient documentation

## 2011-01-16 DIAGNOSIS — L539 Erythematous condition, unspecified: Secondary | ICD-10-CM | POA: Insufficient documentation

## 2011-01-16 DIAGNOSIS — C50919 Malignant neoplasm of unspecified site of unspecified female breast: Secondary | ICD-10-CM | POA: Insufficient documentation

## 2011-01-21 ENCOUNTER — Encounter (HOSPITAL_BASED_OUTPATIENT_CLINIC_OR_DEPARTMENT_OTHER): Payer: BC Managed Care – PPO | Admitting: Oncology

## 2011-01-21 ENCOUNTER — Ambulatory Visit: Payer: BC Managed Care – PPO | Admitting: Physical Therapy

## 2011-01-21 DIAGNOSIS — C50919 Malignant neoplasm of unspecified site of unspecified female breast: Secondary | ICD-10-CM

## 2011-01-21 DIAGNOSIS — C773 Secondary and unspecified malignant neoplasm of axilla and upper limb lymph nodes: Secondary | ICD-10-CM

## 2011-01-21 DIAGNOSIS — I890 Lymphedema, not elsewhere classified: Secondary | ICD-10-CM

## 2011-01-23 ENCOUNTER — Ambulatory Visit (HOSPITAL_COMMUNITY)
Admission: RE | Admit: 2011-01-23 | Discharge: 2011-01-23 | Disposition: A | Discharge status: Home / Self Care | Payer: BC Managed Care – PPO | Source: Ambulatory Visit | Attending: Oncology | Admitting: Oncology

## 2011-01-23 ENCOUNTER — Encounter: Payer: BC Managed Care – PPO | Admitting: Physical Therapy

## 2011-01-23 DIAGNOSIS — M7989 Other specified soft tissue disorders: Secondary | ICD-10-CM

## 2011-01-28 ENCOUNTER — Other Ambulatory Visit: Payer: Self-pay | Admitting: Oncology

## 2011-01-28 ENCOUNTER — Ambulatory Visit: Payer: BC Managed Care – PPO | Admitting: Physical Therapy

## 2011-01-28 ENCOUNTER — Encounter (HOSPITAL_BASED_OUTPATIENT_CLINIC_OR_DEPARTMENT_OTHER): Payer: BC Managed Care – PPO | Admitting: Oncology

## 2011-01-28 DIAGNOSIS — C773 Secondary and unspecified malignant neoplasm of axilla and upper limb lymph nodes: Secondary | ICD-10-CM

## 2011-01-28 DIAGNOSIS — C50919 Malignant neoplasm of unspecified site of unspecified female breast: Secondary | ICD-10-CM

## 2011-01-28 DIAGNOSIS — Z5111 Encounter for antineoplastic chemotherapy: Secondary | ICD-10-CM

## 2011-01-28 LAB — CBC WITH DIFFERENTIAL/PLATELET
BASO%: 0.1 % (ref 0.0–2.0)
Basophils Absolute: 0 10*3/uL (ref 0.0–0.1)
EOS%: 0.1 % (ref 0.0–7.0)
HGB: 12.1 g/dL (ref 11.6–15.9)
MCH: 32.8 pg (ref 25.1–34.0)
RDW: 17.5 % — ABNORMAL HIGH (ref 11.2–14.5)
lymph#: 0.7 10*3/uL — ABNORMAL LOW (ref 0.9–3.3)

## 2011-01-30 ENCOUNTER — Ambulatory Visit: Payer: BC Managed Care – PPO | Admitting: Physical Therapy

## 2011-02-01 ENCOUNTER — Ambulatory Visit: Payer: BC Managed Care – PPO | Admitting: Physical Therapy

## 2011-02-04 ENCOUNTER — Ambulatory Visit: Payer: BC Managed Care – PPO | Admitting: Physical Therapy

## 2011-02-07 ENCOUNTER — Ambulatory Visit: Payer: BC Managed Care – PPO | Admitting: Physical Therapy

## 2011-02-11 ENCOUNTER — Ambulatory Visit: Payer: BC Managed Care – PPO | Admitting: Physical Therapy

## 2011-02-14 ENCOUNTER — Ambulatory Visit: Payer: BC Managed Care – PPO | Admitting: Physical Therapy

## 2011-02-20 ENCOUNTER — Ambulatory Visit: Payer: BC Managed Care – PPO | Attending: Internal Medicine | Admitting: Physical Therapy

## 2011-02-20 DIAGNOSIS — IMO0001 Reserved for inherently not codable concepts without codable children: Secondary | ICD-10-CM | POA: Insufficient documentation

## 2011-02-20 DIAGNOSIS — I890 Lymphedema, not elsewhere classified: Secondary | ICD-10-CM | POA: Insufficient documentation

## 2011-02-21 ENCOUNTER — Ambulatory Visit: Payer: BC Managed Care – PPO | Admitting: Physical Therapy

## 2011-02-22 ENCOUNTER — Encounter (HOSPITAL_BASED_OUTPATIENT_CLINIC_OR_DEPARTMENT_OTHER): Payer: BC Managed Care – PPO | Admitting: Oncology

## 2011-02-22 DIAGNOSIS — C773 Secondary and unspecified malignant neoplasm of axilla and upper limb lymph nodes: Secondary | ICD-10-CM

## 2011-02-22 DIAGNOSIS — C50919 Malignant neoplasm of unspecified site of unspecified female breast: Secondary | ICD-10-CM

## 2011-02-25 ENCOUNTER — Ambulatory Visit: Payer: BC Managed Care – PPO | Admitting: Physical Therapy

## 2011-02-27 ENCOUNTER — Encounter: Payer: BC Managed Care – PPO | Admitting: Physical Therapy

## 2011-04-02 ENCOUNTER — Encounter (INDEPENDENT_AMBULATORY_CARE_PROVIDER_SITE_OTHER): Payer: Self-pay | Admitting: Surgery

## 2011-04-24 ENCOUNTER — Telehealth: Payer: Self-pay | Admitting: *Deleted

## 2011-04-25 NOTE — Telephone Encounter (Signed)
Patient to bring consent with her to fu appointment with dr. Dayton Scrape

## 2011-04-30 ENCOUNTER — Encounter (INDEPENDENT_AMBULATORY_CARE_PROVIDER_SITE_OTHER): Payer: Self-pay | Admitting: Surgery

## 2011-04-30 ENCOUNTER — Ambulatory Visit (INDEPENDENT_AMBULATORY_CARE_PROVIDER_SITE_OTHER): Payer: BC Managed Care – PPO | Admitting: Surgery

## 2011-04-30 ENCOUNTER — Encounter (INDEPENDENT_AMBULATORY_CARE_PROVIDER_SITE_OTHER): Payer: Self-pay | Admitting: General Surgery

## 2011-04-30 VITALS — BP 134/86 | HR 68 | Temp 96.9°F | Resp 16 | Ht 61.0 in | Wt 108.8 lb

## 2011-04-30 DIAGNOSIS — Z853 Personal history of malignant neoplasm of breast: Secondary | ICD-10-CM

## 2011-04-30 NOTE — Progress Notes (Signed)
04/30/2011  Benjamine Mola Dung is a 60 y.o.female who presents for routine followup of her Left breast cancer diagnosed in 1997 and treated with Lumpectomy and radiation.She developed an axillary recurrence this year and she underwent an axillary dissection for that. She has been doing shoulder exercises and thinks she has almost normal mobility. She has developed some lymphedema but controlling it with a sleeve She has finished radiation and chemo and is now on Anastrozole PFSH She has had no significant changes since the last visit here.  ROS There have been no significant changes since the last visit here  General: The patient is alert, oriented, generally healty appearing, NAD. Mood and affect are normal.  Breasts: The right breast was okay. The left breast shows surgical changes in the medial aspect from her lumpectomy many years ago. The axillary area is healed. mimimal cording.  Lymphatics: She has no axillary or supraclavicular adenopathy on either side.  Extremities: Full ROM of the surgical side with mild lymphedema noted Data Reviewed: Oncology notes  Impression: Stable exam Plan: Revisit in six months

## 2011-04-30 NOTE — Patient Instructions (Signed)
Continue your exercises - you are doing well with them in terms of mobility of your shoulder. I will see you again in about six months, but if you have any problems come back sooner

## 2011-05-02 ENCOUNTER — Encounter: Payer: Self-pay | Admitting: Radiation Oncology

## 2011-05-07 ENCOUNTER — Ambulatory Visit
Admission: RE | Admit: 2011-05-07 | Discharge: 2011-05-07 | Disposition: A | Discharge status: Home / Self Care | Payer: BC Managed Care – PPO | Source: Ambulatory Visit | Attending: Radiation Oncology | Admitting: Radiation Oncology

## 2011-05-07 VITALS — BP 143/93 | HR 99 | Temp 97.5°F | Resp 18 | Wt 108.1 lb

## 2011-05-07 DIAGNOSIS — Z853 Personal history of malignant neoplasm of breast: Secondary | ICD-10-CM

## 2011-05-07 NOTE — Progress Notes (Signed)
CC:   Ladene Artist, M.D. Currie Paris, M.D. Titus Dubin. [place], MD,FACP,FCCP Carrington Clamp, M.D.  NARRATIVE:  Shirley Melendez returns today approximately 6 weeks following completion of radiation therapy to the left axilla in the management of her recurrent carcinoma of the left breast.  She is without complaints today.  She will see Dr. Truett Perna for a followup visit on December 3rd, and also Dr. Jamey Ripa for a followup visit in 6 months.  She started Arimidex on October 9th.  She continues to use her left arm sleeve.  PHYSICAL EXAMINATION:  Nodes: Without palpable cervical, supraclavicular, or axillary adenopathy.  Chest: Lungs clear.  Breasts: Without masses or lesions.  Extremities: She wears a left arm sleeve and glove.  There is no edema.  IMPRESSION:  Satisfactory progress.  I have not scheduled Ms. Irving for a formal followup visit.  I believe that she is scheduled for followup mammography at the breast center this spring.    ______________________________ Maryln Gottron, M.D. RJM/MEDQ  D:  05/07/2011  T:  05/07/2011  Job:  [id number]

## 2011-05-07 NOTE — Progress Notes (Signed)
SKIN LOOKS GOOD WITHOUT REDNESS OR DESQUAMATION.  SHE DOES HAVE LYMPHADEMA IN LEFT ARM, WEARING SLEEVE AND GLOVE.  SHE SAYS THIS HELPS

## 2011-05-20 ENCOUNTER — Ambulatory Visit (HOSPITAL_BASED_OUTPATIENT_CLINIC_OR_DEPARTMENT_OTHER): Payer: BC Managed Care – PPO | Admitting: Oncology

## 2011-05-20 ENCOUNTER — Other Ambulatory Visit: Payer: Self-pay | Admitting: *Deleted

## 2011-05-20 ENCOUNTER — Telehealth: Payer: Self-pay | Admitting: Oncology

## 2011-05-20 VITALS — BP 152/92 | HR 94 | Temp 98.0°F | Ht 61.0 in | Wt 109.1 lb

## 2011-05-20 DIAGNOSIS — I890 Lymphedema, not elsewhere classified: Secondary | ICD-10-CM

## 2011-05-20 DIAGNOSIS — C773 Secondary and unspecified malignant neoplasm of axilla and upper limb lymph nodes: Secondary | ICD-10-CM

## 2011-05-20 DIAGNOSIS — C50919 Malignant neoplasm of unspecified site of unspecified female breast: Secondary | ICD-10-CM

## 2011-05-20 DIAGNOSIS — R209 Unspecified disturbances of skin sensation: Secondary | ICD-10-CM

## 2011-05-20 DIAGNOSIS — Z853 Personal history of malignant neoplasm of breast: Secondary | ICD-10-CM

## 2011-05-20 MED ORDER — OXYCODONE-ACETAMINOPHEN 5-325 MG PO TABS: 1.0000 | ORAL_TABLET | Freq: Four times a day (QID) | ORAL | Status: DC | PRN
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
## 2011-05-20 NOTE — Telephone Encounter (Signed)
gve the pt her feb 2013 appt calendar

## 2011-06-18 DIAGNOSIS — G5600 Carpal tunnel syndrome, unspecified upper limb: Secondary | ICD-10-CM

## 2011-06-18 HISTORY — DX: Carpal tunnel syndrome, unspecified upper limb: G56.00

## 2011-07-24 ENCOUNTER — Other Ambulatory Visit: Payer: Self-pay | Admitting: Obstetrics and Gynecology

## 2011-07-24 DIAGNOSIS — Z853 Personal history of malignant neoplasm of breast: Secondary | ICD-10-CM

## 2011-08-12 ENCOUNTER — Ambulatory Visit (HOSPITAL_BASED_OUTPATIENT_CLINIC_OR_DEPARTMENT_OTHER): Payer: BC Managed Care – PPO | Admitting: Oncology

## 2011-08-12 ENCOUNTER — Telehealth: Payer: Self-pay | Admitting: Oncology

## 2011-08-12 VITALS — BP 146/92 | HR 99 | Temp 98.4°F | Ht 61.0 in | Wt 109.1 lb

## 2011-08-12 DIAGNOSIS — Z853 Personal history of malignant neoplasm of breast: Secondary | ICD-10-CM
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
## 2011-08-12 NOTE — Telephone Encounter (Signed)
Called Diane @ [facility], left message regarding referral due to neuropathy. Filled referral note and taken to HIM for referral

## 2011-08-16 ENCOUNTER — Telehealth: Payer: Self-pay | Admitting: *Deleted

## 2011-08-16 NOTE — Telephone Encounter (Signed)
Has appointment on 09/04/11 at 11am. Patient is aware.

## 2011-09-03 ENCOUNTER — Ambulatory Visit
Admission: RE | Admit: 2011-09-03 | Discharge: 2011-09-03 | Disposition: A | Discharge status: Home / Self Care | Payer: BC Managed Care – PPO | Source: Ambulatory Visit | Attending: Obstetrics and Gynecology | Admitting: Obstetrics and Gynecology

## 2011-09-03 DIAGNOSIS — Z853 Personal history of malignant neoplasm of breast: Secondary | ICD-10-CM

## 2011-09-04 ENCOUNTER — Other Ambulatory Visit: Payer: Self-pay | Admitting: Obstetrics and Gynecology

## 2011-09-04 ENCOUNTER — Ambulatory Visit
Admission: RE | Admit: 2011-09-04 | Discharge: 2011-09-04 | Disposition: A | Discharge status: Home / Self Care | Payer: BC Managed Care – PPO | Source: Ambulatory Visit | Attending: Obstetrics and Gynecology | Admitting: Obstetrics and Gynecology

## 2011-09-04 DIAGNOSIS — Z853 Personal history of malignant neoplasm of breast: Secondary | ICD-10-CM

## 2011-09-27 ENCOUNTER — Ambulatory Visit (INDEPENDENT_AMBULATORY_CARE_PROVIDER_SITE_OTHER): Payer: BC Managed Care – PPO | Admitting: Internal Medicine

## 2011-09-27 ENCOUNTER — Encounter: Payer: Self-pay | Admitting: Internal Medicine

## 2011-09-27 VITALS — BP 148/86 | HR 103 | Temp 98.1°F | Resp 12 | Ht 60.03 in | Wt 108.2 lb

## 2011-09-27 DIAGNOSIS — M899 Disorder of bone, unspecified: Secondary | ICD-10-CM

## 2011-09-27 DIAGNOSIS — H9190 Unspecified hearing loss, unspecified ear: Secondary | ICD-10-CM

## 2011-09-27 DIAGNOSIS — R9431 Abnormal electrocardiogram [ECG] [EKG]: Secondary | ICD-10-CM

## 2011-09-27 DIAGNOSIS — M949 Disorder of cartilage, unspecified: Secondary | ICD-10-CM

## 2011-09-27 DIAGNOSIS — H9193 Unspecified hearing loss, bilateral: Secondary | ICD-10-CM

## 2011-09-27 DIAGNOSIS — Z0000 Encounter for general adult medical examination without abnormal findings: Secondary | ICD-10-CM

## 2011-09-27 LAB — LIPID PANEL
Cholesterol: 258 mg/dL — ABNORMAL HIGH (ref 0–200)
HDL: 105.6 mg/dL (ref >39.00)
Triglycerides: 79 mg/dL (ref 0.0–149.0)

## 2011-09-27 LAB — CBC WITH DIFFERENTIAL/PLATELET
Basophils Absolute: 0 10*3/uL (ref 0.0–0.1)
Eosinophils Absolute: 0.1 10*3/uL (ref 0.0–0.7)
Lymphocytes Relative: 21.6 % (ref 12.0–46.0)
Lymphs Abs: 1.3 10*3/uL (ref 0.7–4.0)
MCHC: 33.9 g/dL (ref 30.0–36.0)
Monocytes Relative: 8.2 % (ref 3.0–12.0)
Platelets: 226 10*3/uL (ref 150.0–400.0)
RDW: 12.9 % (ref 11.5–14.6)

## 2011-09-27 LAB — BASIC METABOLIC PANEL
BUN: 17 mg/dL (ref 6–23)
CO2: 29 mEq/L (ref 19–32)
Calcium: 9.3 mg/dL (ref 8.4–10.5)
GFR: 94.8 mL/min (ref >60.00)
Glucose, Bld: 98 mg/dL (ref 70–99)

## 2011-09-27 LAB — HEMOGLOBIN A1C: Hgb A1c MFr Bld: 5.6 % (ref 4.6–6.5)

## 2011-09-27 LAB — TSH: TSH: 1.32 u[IU]/mL (ref 0.35–5.50)

## 2011-09-27 NOTE — Progress Notes (Signed)
  Subjective:    Patient ID: Shirley Melendez, female    DOB: 1951/06/07, 61 y.o.   MRN: [id number]  HPI    Review of Systems     Objective:   Physical Exam hearing to normal voice was normal but she appears to have decreased auditory acuity to whisper at 6 feet        Assessment & Plan:  Audiology referral will be made

## 2011-09-27 NOTE — Progress Notes (Signed)
  Subjective:    Patient ID: Shirley Melendez, female    DOB: [birth year], 61 y.o.   MRN: [id number]  HPI Shirley Melendez is here for a physical;acute issues are denoted below.      Review of Systems Her Oncologist is monitoring labs on a regular basis.  She is being followed by Neurologist for bilateral carpal tunnel syndrome; a NCT/EMG was performed.  She does have lymphedema of left upper extremity.  She is seeing a [facility] for chronic cervical pain as well as chronic  low back symptoms.  Review of the old record reveals a fasting glucose of 110 on 01/07/11; A1c monitor would be appropriate.  Advanced cholesterol testing reveals she has no long-term cardiovascular risk unless her LDL were greater than 160.  She does have osteopenia; she is due for followup bone density. Vitamin D level should be checked as well.     Objective:   Physical Exam Gen.: Healthy and well-nourished in appearance. Alert, appropriate and cooperative throughout exam. Head: Normocephalic without obvious abnormalities  Eyes: No corneal or conjunctival inflammation noted. Pupils equal round reactive to light and accommodation. Fundal exam is benign without hemorrhages, exudate, papilledema. Extraocular motion intact. Vision grossly normal. Lids lie low w/o true ptosis Ears: External  ear exam reveals no significant lesions or deformities. Canals clear .TMs normal. Hearing is grossly normal bilaterally. Nose: External nasal exam reveals no deformity or inflammation. Nasal mucosa are pink and moist. No lesions or exudates noted.  Mouth: Oral mucosa and oropharynx reveal no lesions or exudates. Teeth in good repair. Neck: No deformities, masses, or tenderness noted. Range of motion & Thyroid normal. Lungs: Normal respiratory effort; chest expands symmetrically. Lungs are clear to auscultation without rales, wheezes, or increased work of breathing. Heart: Normal rate and rhythm. Normal S1 and S2. No gallop, click, or rub. S4  with slurring ;no  murmur. Abdomen: Bowel sounds normal; abdomen soft and nontender. No masses, organomegaly or hernias noted. Genitalia: Dr Shirley Melendez, Gyn Musculoskeletal/extremities: No deformity or scoliosis noted of  the thoracic or lumbar spine. No clubbing, cyanosis, edema noted. Range of motion  normal .Tone & strength  Normal.Joints: OA hand changes Nails: onycholysis. RUE  in a soft brace. Lymphedema "glove" left upper extremity. Vascular: Carotid, radial artery, dorsalis pedis and  posterior tibial pulses are full and equal. No bruits present. Neurologic: Alert and oriented x3. Deep tendon reflexes symmetrical and 1.5+.          Skin: Intact without suspicious lesions or rashes. Lymph: No cervical, axillary lymphadenopathy present. Psych: Mood and affect are normal. Normally interactive                                                                                         Assessment & Plan:  #1 comprehensive physical exam; no acute findings #2 see Problem List with Assessments & Recommendations Plan: see Orders

## 2011-09-27 NOTE — Patient Instructions (Signed)
Preventive Health Care: Exercise  30-45  minutes a day, 3-4 days a week. Walking is especially valuable in preventing Osteoporosis. Eat a low-fat diet with lots of fruits and vegetables, up to 7-9 servings per day. Consume less than 30 grams of sugar per day from foods & drinks with High Fructose Corn Syrup as # 1,2,3 or #4 on label. Health Care Power of Attorney places you in charge of your health care  decisions. Verify this is  in place.  Please try to go on My Chart within the next 24 hours to allow me to release the results directly to you.

## 2011-09-27 NOTE — Progress Notes (Signed)
Addended byMarga Melnick F on: 09/27/2011 10:55 AM   Modules accepted: Orders

## 2011-09-30 ENCOUNTER — Telehealth: Payer: Self-pay | Admitting: *Deleted

## 2011-09-30 NOTE — Telephone Encounter (Signed)
Calling to report "flare up" of her lymphedema in arm. Has spoken with therapist on phone. Asking for referral back to lymphedema clniic for them to re-evaluate her arm.

## 2011-10-02 ENCOUNTER — Other Ambulatory Visit: Payer: Self-pay | Admitting: *Deleted

## 2011-10-02 ENCOUNTER — Telehealth: Payer: Self-pay | Admitting: *Deleted

## 2011-10-02 DIAGNOSIS — Z853 Personal history of malignant neoplasm of breast: Secondary | ICD-10-CM

## 2011-10-02 NOTE — Telephone Encounter (Signed)
Per Dr. Truett Perna: OK to refer pt back to lymphedema clinic. Called pt, informed her that referral to lymphedema clinic was sent, she verbalized understanding.

## 2011-10-07 ENCOUNTER — Ambulatory Visit: Payer: BC Managed Care – PPO | Attending: Oncology | Admitting: Physical Therapy

## 2011-10-07 DIAGNOSIS — I890 Lymphedema, not elsewhere classified: Secondary | ICD-10-CM | POA: Insufficient documentation

## 2011-10-07 DIAGNOSIS — IMO0001 Reserved for inherently not codable concepts without codable children: Secondary | ICD-10-CM | POA: Insufficient documentation

## 2011-10-14 ENCOUNTER — Ambulatory Visit (INDEPENDENT_AMBULATORY_CARE_PROVIDER_SITE_OTHER)
Admission: RE | Admit: 2011-10-14 | Discharge: 2011-10-14 | Disposition: A | Discharge status: Home / Self Care | Payer: BC Managed Care – PPO | Source: Ambulatory Visit

## 2011-10-14 DIAGNOSIS — Z0000 Encounter for general adult medical examination without abnormal findings: Secondary | ICD-10-CM

## 2011-10-14 DIAGNOSIS — M810 Age-related osteoporosis without current pathological fracture: Secondary | ICD-10-CM

## 2011-10-17 ENCOUNTER — Ambulatory Visit: Payer: BC Managed Care – PPO | Attending: Oncology | Admitting: Physical Therapy

## 2011-10-17 DIAGNOSIS — IMO0001 Reserved for inherently not codable concepts without codable children: Secondary | ICD-10-CM | POA: Insufficient documentation

## 2011-10-17 DIAGNOSIS — I890 Lymphedema, not elsewhere classified: Secondary | ICD-10-CM | POA: Insufficient documentation

## 2011-10-22 ENCOUNTER — Other Ambulatory Visit: Payer: Self-pay | Admitting: Orthopedic Surgery

## 2011-10-23 ENCOUNTER — Encounter: Payer: BC Managed Care – PPO | Admitting: Physical Therapy

## 2011-10-24 ENCOUNTER — Encounter (HOSPITAL_BASED_OUTPATIENT_CLINIC_OR_DEPARTMENT_OTHER): Payer: Self-pay | Admitting: *Deleted

## 2011-10-24 NOTE — Progress Notes (Signed)
Patient stopped taking aspirin and ibuprofen on 5/6, vit. E on 5/9.  Advised not to restart these until after surgery.  No meds to take am of surgery.  Time and date confirmed.  Patient had a positive nasal swab for MRSA in 2012 prior to surgery, antibiotic protocol followed.  No further issues. Patient wears a compression sleeve daily on left arm due to lymphadema.  Told patient to wear that morning like normal and we would remove if necessary.  Did have an axillary dissection in 2012, so limited use of left arm is required.

## 2011-10-28 NOTE — H&P (Signed)
Shirley Melendez is an 61 y.o. female.   Chief Complaint: c/o chronic and progressive right hand numbness and tingling and STS symptoms index finger HPI: . Shirley Melendez is an unfortunate 61 year old woman who has a history of left breast cancer. She underwent left breast lumpectomy in 1997 followed by 5 years of Tamoxifen therapy. 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 Dr. Jamey Ripa was her attending general surgeon. She now has significant left upper extremity lymphedema and is chronically wearing a compression garment. She is on Arimidex s/p radiation and chemotherapy. She had numbness in her hands, right worse than left and was referred for a detailed neurologic consult by you. She was noted to have clinical signs of carpal tunnel syndrome and electrodiagnostic studies revealed severe right carpal tunnel syndrome with abnormal EMG findings in the abductor pollicis brevis and mild left carpal tunnel syndrome. She is now referred for an upper extremity orthopaedic consult.  Past Medical History  Diagnosis Date  . CTS (carpal tunnel syndrome)     Dr Terrace Arabia , Neurology  . Fasting hyperglycemia 2012    FBS 110  . Breast cancer 1997    surgery, chemo, & radiation  . Breast cancer 2012    surgery, chemo, & radiation with axillary dissection  . PONV (postoperative nausea and vomiting) 1997    no problems since  . 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 Arthritis     chronic neck and back pain  . MRSA (methicillin resistant staph aureus) culture positive 2012    swab was positive, treated per protocol.  No other issues.    Past Surgical History  Procedure Date  . Knee surgery 01/2009    left  . Axillary node dissection     left  . Dilation and curettage, diagnostic / therapeutic 2002  . Colonoscopy 2005    negative  . Breast lumpectomy 09/1995, 10/1995    left - lumpectomy  . Breast  surgery 2012    mastectomy, axillary node dissection left  . Mastectomy 2012    with axillary dissection on left    Family History  Problem Relation Age of Onset  . Hypertension Mother    Social History:  reports that she has never smoked. She has never used smokeless tobacco. She reports that she does not drink alcohol or use illicit drugs.  Allergies:  Allergies  Allergen Reactions  . Compazine Other (See Comments)    Caused convulsions/seizure (severe)  . Erythromycin Nausea And Vomiting  . Prochlorperazine Edisylate     nausea    No prescriptions prior to admission    No results found for this or any previous visit (from the past 48 hour(s)).  No results found.   Pertinent items are noted in HPI.  There were no vitals taken for this visit.  General appearance: alert Head: Normocephalic, without obvious abnormality Neck: supple, symmetrical, trachea midline Resp: clear to auscultation bilaterally Cardio: regular rate and rhythm GI: normal findings: bowel sounds normal Extremities:. She has 3+ lymphedema of her left hand. She is wearing a full length compression garment. She has mild thenar atrophy and a prominent right thumb CMC joint. She has signs of mild osteoarthritis at her MP and IP joints. She has triggering of her right long finger A-1 pulley. She has triggering of her left thumb at the A-1 pulley. She has diminished sensibility in the median  nerve distribution on the right and intact on the left. Her ulnar and radial sensibility is intact bilaterally.   Electrodiagnostic studies confirmed severe right carpal tunnel syndrome with abnormal EMG findings in the thenar muscles and mild left carpal tunnel syndrome.  Pulses: 2+ and symmetric Skin: normal Neurologic: Grossly normal   Assessment/Plan Impression: Right CTS and STS right long finger  Plan: To OR for Right CTR and release A-1 pulley right long finger . The procedure, risks and post-op course were  discussed at length and the patient was in agreement with the plan.  DASNOIT,Ople Girgis J 10/28/2011, 2:36 PM    H&P documentation: 10/29/2011  -History and Physical Reviewed  -Patient has been re-examined  -No change in the plan of care  Wyn Forster, MD

## 2011-10-29 ENCOUNTER — Encounter (HOSPITAL_BASED_OUTPATIENT_CLINIC_OR_DEPARTMENT_OTHER): Payer: Self-pay | Admitting: Certified Registered Nurse Anesthetist

## 2011-10-29 ENCOUNTER — Encounter (HOSPITAL_BASED_OUTPATIENT_CLINIC_OR_DEPARTMENT_OTHER): Payer: Self-pay

## 2011-10-29 ENCOUNTER — Encounter (HOSPITAL_BASED_OUTPATIENT_CLINIC_OR_DEPARTMENT_OTHER)
Admission: RE | Disposition: A | Discharge status: Home / Self Care | Payer: Self-pay | Source: Ambulatory Visit | Attending: Orthopedic Surgery

## 2011-10-29 ENCOUNTER — Ambulatory Visit (HOSPITAL_BASED_OUTPATIENT_CLINIC_OR_DEPARTMENT_OTHER): Payer: BC Managed Care – PPO | Admitting: Certified Registered Nurse Anesthetist

## 2011-10-29 ENCOUNTER — Ambulatory Visit (HOSPITAL_BASED_OUTPATIENT_CLINIC_OR_DEPARTMENT_OTHER)
Admission: RE | Admit: 2011-10-29 | Discharge: 2011-10-29 | Disposition: A | Discharge status: Home / Self Care | Payer: BC Managed Care – PPO | Source: Ambulatory Visit | Attending: Orthopedic Surgery | Admitting: Orthopedic Surgery

## 2011-10-29 DIAGNOSIS — Z9010 Acquired absence of unspecified breast and nipple: Secondary | ICD-10-CM | POA: Insufficient documentation

## 2011-10-29 DIAGNOSIS — Z853 Personal history of malignant neoplasm of breast: Secondary | ICD-10-CM | POA: Insufficient documentation

## 2011-10-29 DIAGNOSIS — M720 Palmar fascial fibromatosis [Dupuytren]: Secondary | ICD-10-CM | POA: Insufficient documentation

## 2011-10-29 DIAGNOSIS — G5600 Carpal tunnel syndrome, unspecified upper limb: Secondary | ICD-10-CM | POA: Insufficient documentation

## 2011-10-29 DIAGNOSIS — M65839 Other synovitis and tenosynovitis, unspecified forearm: Secondary | ICD-10-CM | POA: Insufficient documentation

## 2011-10-29 DIAGNOSIS — M129 Arthropathy, unspecified: Secondary | ICD-10-CM | POA: Insufficient documentation

## 2011-10-29 DIAGNOSIS — M65849 Other synovitis and tenosynovitis, unspecified hand: Secondary | ICD-10-CM | POA: Insufficient documentation

## 2011-10-29 HISTORY — DX: Other specified postprocedural states: Z98.890

## 2011-10-29 HISTORY — DX: Carrier or suspected carrier of methicillin resistant Staphylococcus aureus: Z22.322

## 2011-10-29 HISTORY — PX: CARPAL TUNNEL RELEASE: SHX101

## 2011-10-29 HISTORY — DX: Unspecified osteoarthritis, unspecified site: M19.90

## 2011-10-29 HISTORY — DX: Other specified soft tissue disorders: M79.89

## 2011-10-29 HISTORY — DX: Nausea with vomiting, unspecified: R11.2

## 2011-10-29 HISTORY — PX: TRIGGER FINGER RELEASE: SHX641

## 2011-10-29 LAB — POCT HEMOGLOBIN-HEMACUE: Hemoglobin: 16.1 g/dL — ABNORMAL HIGH (ref 12.0–15.0)

## 2011-10-29 SURGERY — CARPAL TUNNEL RELEASE
Anesthesia: General | Site: Wrist | Laterality: Right | Wound class: Clean

## 2011-10-29 MED ORDER — PROPOFOL 10 MG/ML IV EMUL
INTRAVENOUS | Status: DC | PRN
  Administered 2011-10-29: 150 mg via INTRAVENOUS

## 2011-10-29 MED ORDER — CHLORHEXIDINE GLUCONATE 4 % EX LIQD: 60.0000 mL | Freq: Once | CUTANEOUS | Status: DC

## 2011-10-29 MED ORDER — FENTANYL CITRATE 0.05 MG/ML IJ SOLN: 25.0000 ug | INTRAMUSCULAR | Status: DC | PRN

## 2011-10-29 MED ORDER — ONDANSETRON HCL 4 MG/2ML IJ SOLN
INTRAMUSCULAR | Status: DC | PRN
  Administered 2011-10-29: 4 mg via INTRAVENOUS

## 2011-10-29 MED ORDER — DEXAMETHASONE SODIUM PHOSPHATE 10 MG/ML IJ SOLN
INTRAMUSCULAR | Status: DC | PRN
  Administered 2011-10-29: 10 mg via INTRAVENOUS

## 2011-10-29 MED ORDER — MIDAZOLAM HCL 5 MG/5ML IJ SOLN
INTRAMUSCULAR | Status: DC | PRN
  Administered 2011-10-29: 1 mg via INTRAVENOUS

## 2011-10-29 MED ORDER — OXYCODONE-ACETAMINOPHEN 5-325 MG PO TABS: 1.0000 | ORAL_TABLET | ORAL | Status: AC | PRN

## 2011-10-29 MED ORDER — FENTANYL CITRATE 0.05 MG/ML IJ SOLN
INTRAMUSCULAR | Status: DC | PRN
  Administered 2011-10-29 (×2): 25 ug via INTRAVENOUS

## 2011-10-29 MED ORDER — LACTATED RINGERS IV SOLN
INTRAVENOUS | Status: DC
  Administered 2011-10-29 (×2): via INTRAVENOUS

## 2011-10-29 MED ORDER — METOCLOPRAMIDE HCL 5 MG/ML IJ SOLN: 10.0000 mg | Freq: Once | INTRAMUSCULAR | Status: DC | PRN

## 2011-10-29 MED ORDER — LIDOCAINE HCL 2 % IJ SOLN
INTRAMUSCULAR | Status: DC | PRN
  Administered 2011-10-29: 5 mL

## 2011-10-29 MED ORDER — METOCLOPRAMIDE HCL 5 MG/ML IJ SOLN
INTRAMUSCULAR | Status: DC | PRN
  Administered 2011-10-29: 10 mg via INTRAVENOUS

## 2011-10-29 SURGICAL SUPPLY — 45 items
BANDAGE ADHESIVE 1X3 (GAUZE/BANDAGES/DRESSINGS) IMPLANT
BANDAGE ELASTIC 3 VELCRO ST LF (GAUZE/BANDAGES/DRESSINGS) ×3 IMPLANT
BLADE SURG 15 STRL LF DISP TIS (BLADE) ×2 IMPLANT
BLADE SURG 15 STRL SS (BLADE) ×1
BNDG ELASTIC 2 VLCR STRL LF (GAUZE/BANDAGES/DRESSINGS) IMPLANT
BNDG ESMARK 4X9 LF (GAUZE/BANDAGES/DRESSINGS) IMPLANT
BRUSH SCRUB EZ PLAIN DRY (MISCELLANEOUS) ×3 IMPLANT
CLOTH BEACON ORANGE TIMEOUT ST (SAFETY) ×3 IMPLANT
CORDS BIPOLAR (ELECTRODE) ×3 IMPLANT
COVER MAYO STAND STRL (DRAPES) ×3 IMPLANT
COVER TABLE BACK 60X90 (DRAPES) ×3 IMPLANT
CUFF TOURNIQUET SINGLE 18IN (TOURNIQUET CUFF) IMPLANT
DECANTER SPIKE VIAL GLASS SM (MISCELLANEOUS) IMPLANT
DRAPE EXTREMITY T 121X128X90 (DRAPE) ×3 IMPLANT
DRAPE SURG 17X23 STRL (DRAPES) ×3 IMPLANT
GAUZE SPONGE 4X4 12PLY STRL LF (GAUZE/BANDAGES/DRESSINGS) IMPLANT
GAUZE XEROFORM 1X8 LF (GAUZE/BANDAGES/DRESSINGS) IMPLANT
GLOVE BIO SURGEON STRL SZ 6.5 (GLOVE) ×3 IMPLANT
GLOVE BIOGEL M STRL SZ7.5 (GLOVE) ×3 IMPLANT
GLOVE BIOGEL PI IND STRL 7.0 (GLOVE) ×2 IMPLANT
GLOVE BIOGEL PI INDICATOR 7.0 (GLOVE) ×1
GLOVE ORTHO TXT STRL SZ7.5 (GLOVE) ×3 IMPLANT
GOWN PREVENTION PLUS XLARGE (GOWN DISPOSABLE) ×3 IMPLANT
GOWN PREVENTION PLUS XXLARGE (GOWN DISPOSABLE) IMPLANT
GOWN STRL REIN XL XLG (GOWN DISPOSABLE) ×6 IMPLANT
NEEDLE 27GAX1X1/2 (NEEDLE) ×3 IMPLANT
PACK BASIN DAY SURGERY FS (CUSTOM PROCEDURE TRAY) ×3 IMPLANT
PAD CAST 3X4 CTTN HI CHSV (CAST SUPPLIES) ×2 IMPLANT
PAD CAST 4YDX4 CTTN HI CHSV (CAST SUPPLIES) ×2 IMPLANT
PADDING CAST ABS 4INX4YD NS (CAST SUPPLIES) ×1
PADDING CAST ABS COTTON 4X4 ST (CAST SUPPLIES) ×2 IMPLANT
PADDING CAST COTTON 3X4 STRL (CAST SUPPLIES) ×1
PADDING CAST COTTON 4X4 STRL (CAST SUPPLIES) ×1
SPLINT PLASTER CAST XFAST 3X15 (CAST SUPPLIES) ×10 IMPLANT
SPLINT PLASTER XTRA FASTSET 3X (CAST SUPPLIES) ×5
SPONGE GAUZE 4X4 12PLY (GAUZE/BANDAGES/DRESSINGS) IMPLANT
STOCKINETTE 4X48 STRL (DRAPES) ×3 IMPLANT
STRIP CLOSURE SKIN 1/2X4 (GAUZE/BANDAGES/DRESSINGS) ×3 IMPLANT
SUT PROLENE 3 0 PS 2 (SUTURE) ×3 IMPLANT
SYR 3ML 23GX1 SAFETY (SYRINGE) IMPLANT
SYR CONTROL 10ML LL (SYRINGE) ×3 IMPLANT
TOWEL OR 17X24 6PK STRL BLUE (TOWEL DISPOSABLE) ×3 IMPLANT
TRAY DSU PREP LF (CUSTOM PROCEDURE TRAY) ×3 IMPLANT
UNDERPAD 30X30 INCONTINENT (UNDERPADS AND DIAPERS) ×3 IMPLANT
WATER STERILE IRR 1000ML POUR (IV SOLUTION) ×3 IMPLANT

## 2011-10-29 NOTE — Op Note (Signed)
DICTATED [id number]

## 2011-10-29 NOTE — Op Note (Signed)
Shirley Melendez, Shirley Melendez NO.:  [id number]  MEDICAL RECORD NO.:  [id number]  LOCATION:                                 FACILITY:  PHYSICIAN:  Katy Fitch. Ashlin Kreps, M.D. DATE OF BIRTH:  [birth year]  DATE OF PROCEDURE:  10/29/2011 DATE OF DISCHARGE:                              OPERATIVE REPORT   PREOPERATIVE DIAGNOSES:  Right carpal tunnel syndrome (severe), also stenosing tenosynovitis of right index and right long fingers at A1 pulley, all related likely to use estrogen denying medication, status post mastectomy, radiation, and chemotherapy for breast cancer.  POSTOPERATIVE DIAGNOSES:  Right carpal tunnel syndrome (severe), also stenosing tenosynovitis of right index and right long fingers at A1 pulley, all related likely to use estrogen denying medication, status post mastectomy, radiation, and chemotherapy for breast cancer.  OPERATION: 1. Release of right transverse carpal ligament. 2. Release of right index finger A1 pulley with incidental     tenosynovectomy. 3. Release of right long finger A1 pulley with incidental synovectomy.  OPERATING SURGEON:  Katy Fitch. Mianna Iezzi, M.D.  ASSISTANT:  Jonni Sanger, P.A.  ANESTHESIA:  General by LMA.  SUPERVISING ANESTHESIOLOGIST:  Janetta Hora. Gelene Mink, M.D.  INDICATIONS:  Shirley Melendez is a 61 year old woman referred through the courtesy of Dr. Judie Grieve, attending neurologist, and Dr. Alwyn Ren for evaluation and management of chronic trigger fingers and severe carpal tunnel syndrome.  Shirley Melendez has had breast cancer followed by recurrence with mastectomy and lymph node resection.  She developed hand numbness and triggering of multiple digits.  She is referred to Dr. Judie Grieve, attending neurologist, who performed electrodiagnostic studies revealing severe right carpal tunnel syndrome.  Clinical examination upon referral for hand surgery consult revealed significant stenosing tenosynovitis of the index and long fingers  in  addition to her carpal tunnel syndrome predicament.  We advised to proceed with release of the A1 pulleys of the right index and long fingers and release of the right transverse carpal ligament.  After informed consent, she was brought to the operating room at this time.  PROCEDURE:  Shirley Melendez was brought to room 2 of the [facility] and placed in supine position on the operating table.  Following detailed anesthesia informed consent, general anesthesia by LMA technique was recommended and accepted.  In room 2 under Dr. Thornton Dales direct supervision, general anesthesia by LMA technique was induced.  The right arm was then prepped with Betadine soap and solution, sterilely draped.  Following exsanguination of the right hand and forearm with an Esmarch bandage, Esmarch was left in the proximal forearm as a tourniquet.  Procedure commenced with a short incision in the line of the ring finger of the palm.  Subcutaneous tissues were carefully divided in the palmar fascia.  This was split longitudinally to reveal the common sensory branch of the median nerve.  Palmar fascia was notable for early Dupuytren's palmar fibromatosis.  The distal margin transcarpal ligaments isolated, split with scalpel, followed by scissors.  The carpal canal was sounded with [facility] 4 elevator, and after separation of the median nerve from the transcarpal ligament, the ligament was released along its ulnar border extending into the distal forearm.  This  widely opened carpal canal.  Bleeding points along the margin of the released ligament were electrocauterized with bipolar current followed by repair of the skin with intradermal 3-0 Prolene suture.  Attention was then directed to the index and long fingers.  Oblique incisions were fashioned over the palpably thickened A1 pulleys. Subcutaneous tissues were carefully divided taking care to release the palmar fascia.  The A1 pulley of the index  finger was isolated, split with scalpel and scissors.  A small A0 pulley was identified and released.  The tendons were delivered and stripped of fibrotic tenosynovium.  In the long finger likewise, the A1 pulley was isolated, blunt Ragnell retractors were used to protect the neurovascular structures.  The pulley was split with scalpel and scissors followed by synovectomy and removal of fibrotic tenosynovium from the superficialis tendon.  Shirley Melendez did appear to have significant Dupuytren's diathesis.  There was also a rather fibrotic quality to all of her subcutaneous tissues. We have witnessed this previously in individuals who are on Arimidex, and later she was beginning to reflect that there was an issue with estrogen denial on the onset of tendonitis related disorders.  The wounds were repaired with intradermal 3-0 Prolene followed by sterile gauze dressings.  A Webril padding followed by application of volar plaster splint maintaining the wrist in 10 degrees of dorsiflexion.  For aftercare, Shirley Melendez was provided a prescription for oxycodone 5 mg, 1 p.o. q.4 hours p.r.n. pain, 20 tablets without refill.     Katy Fitch Kahlil Cowans, M.D.     RVS/MEDQ  D:  10/29/2011  T:  10/29/2011  Job:  [id number]  cc:   Levert Feinstein, MD Daneen Schick, Dr.

## 2011-10-29 NOTE — Brief Op Note (Signed)
10/29/2011  8:10 AM  PATIENT:  Shirley Melendez  61 y.o. female  PRE-OPERATIVE DIAGNOSIS:  right carpal tunnel syndrome, right long and index finger stenosing tenosynovitis  POST-OPERATIVE DIAGNOSIS:  right carpal tunnel syndrome, right index and long finger stenosing tenosynovitis  PROCEDURE:  CARPAL TUNNEL RELEASE (Right) RELEASE TRIGGER FINGER/A-1 PULLEY RIGHT INDEX AND LONG FINGER  SURGEON: Wyn Forster., MD   PHYSICIAN ASSISTANT:   ASSISTANTS: Mallory Shirk.A-C   ANESTHESIA:   general  EBL:     BLOOD ADMINISTERED:none  DRAINS: none   LOCAL MEDICATIONS USED:  XYLOCAINE   SPECIMEN:  No Specimen  DISPOSITION OF SPECIMEN:  N/A  COUNTS:  YES  TOURNIQUET:  * Missing tourniquet times found for documented tourniquets in log:  [id number] *  DICTATION: .Other Dictation: Dictation Number [id number]  PLAN OF CARE: Discharge to home after PACU  PATIENT DISPOSITION:  PACU - hemodynamically stable.

## 2011-10-29 NOTE — Anesthesia Postprocedure Evaluation (Signed)
Anesthesia Post Note  Patient: Shirley Melendez  Procedure(s) Performed: Procedure(s) (LRB): CARPAL TUNNEL RELEASE (Right) RELEASE TRIGGER FINGER/A-1 PULLEY (Right)  Anesthesia type: General  Patient location: PACU  Post pain: Pain level controlled  Post assessment: Patient's Cardiovascular Status Stable  Last Vitals:  Filed Vitals:   10/29/11 0900  BP: 179/79  Pulse: 93  Temp:   Resp: 11    Post vital signs: Reviewed and stable  Level of consciousness: alert  Complications: No apparent anesthesia complications

## 2011-10-29 NOTE — Transfer of Care (Signed)
Immediate Anesthesia Transfer of Care Note  Patient: Shirley Melendez  Procedure(s) Performed: Procedure(s) (LRB): CARPAL TUNNEL RELEASE (Right) RELEASE TRIGGER FINGER/A-1 PULLEY (Right)  Patient Location: PACU  Anesthesia Type: General  Level of Consciousness: awake, alert , oriented and patient cooperative  Airway & Oxygen Therapy: Patient Spontanous Breathing and Patient connected to face mask oxygen  Post-op Assessment: Report given to PACU RN and Post -op Vital signs reviewed and stable  Post vital signs: Reviewed and stable  Complications: No apparent anesthesia complications

## 2011-10-29 NOTE — Anesthesia Preprocedure Evaluation (Signed)
Anesthesia Evaluation  Patient identified by MRN, date of birth, ID band Patient awake    Reviewed: Allergy & Precautions, H&P , NPO status , Patient's Chart, lab work & pertinent test results, reviewed documented beta blocker date and time   History of Anesthesia Complications (+) PONV  Airway Mallampati: II TM Distance: >3 FB Neck ROM: full    Dental   Pulmonary neg pulmonary ROS,          Cardiovascular negative cardio ROS      Neuro/Psych  Neuromuscular disease negative psych ROS   GI/Hepatic negative GI ROS, Neg liver ROS,   Endo/Other  negative endocrine ROS  Renal/GU negative Renal ROS  negative genitourinary   Musculoskeletal   Abdominal   Peds  Hematology negative hematology ROS (+)   Anesthesia Other Findings See surgeon's H&P   Reproductive/Obstetrics negative OB ROS                           Anesthesia Physical Anesthesia Plan  ASA: II  Anesthesia Plan: General   Post-op Pain Management:    Induction: Intravenous  Airway Management Planned: LMA  Additional Equipment:   Intra-op Plan:   Post-operative Plan: Extubation in OR  Informed Consent: I have reviewed the patients History and Physical, chart, labs and discussed the procedure including the risks, benefits and alternatives for the proposed anesthesia with the patient or authorized representative who has indicated his/her understanding and acceptance.     Plan Discussed with: CRNA and Surgeon  Anesthesia Plan Comments:         Anesthesia Quick Evaluation

## 2011-10-29 NOTE — Discharge Instructions (Signed)

## 2011-10-29 NOTE — Anesthesia Procedure Notes (Signed)
Procedure Name: LMA Insertion Date/Time: 10/29/2011 7:46 AM Performed by: Makenli Derstine D Pre-anesthesia Checklist: Patient identified, Emergency Drugs available, Suction available and Patient being monitored Patient Re-evaluated:Patient Re-evaluated prior to inductionOxygen Delivery Method: Circle System Utilized Preoxygenation: Pre-oxygenation with 100% oxygen Intubation Type: IV induction Ventilation: Mask ventilation without difficulty LMA: LMA inserted LMA Size: 4.0 Number of attempts: 1 Placement Confirmation: positive ETCO2 Tube secured with: Tape Dental Injury: Teeth and Oropharynx as per pre-operative assessment

## 2011-11-01 ENCOUNTER — Encounter (HOSPITAL_BASED_OUTPATIENT_CLINIC_OR_DEPARTMENT_OTHER): Payer: Self-pay | Admitting: Orthopedic Surgery

## 2011-11-07 ENCOUNTER — Ambulatory Visit (INDEPENDENT_AMBULATORY_CARE_PROVIDER_SITE_OTHER): Payer: BC Managed Care – PPO | Admitting: Surgery

## 2011-11-07 ENCOUNTER — Encounter (INDEPENDENT_AMBULATORY_CARE_PROVIDER_SITE_OTHER): Payer: Self-pay | Admitting: Surgery

## 2011-11-07 VITALS — BP 142/82 | HR 104 | Temp 99.0°F | Resp 14 | Ht 60.0 in | Wt 109.0 lb

## 2011-11-07 DIAGNOSIS — IMO0002 Reserved for concepts with insufficient information to code with codable children: Secondary | ICD-10-CM

## 2011-11-07 DIAGNOSIS — E8989 Other postprocedural endocrine and metabolic complications and disorders: Secondary | ICD-10-CM

## 2011-11-07 DIAGNOSIS — Z853 Personal history of malignant neoplasm of breast: Secondary | ICD-10-CM

## 2011-11-07 DIAGNOSIS — I890 Lymphedema, not elsewhere classified: Secondary | ICD-10-CM

## 2011-11-07 HISTORY — DX: Lymphedema, not elsewhere classified: I89.0

## 2011-11-07 HISTORY — DX: Other postprocedural endocrine and metabolic complications and disorders: E89.89

## 2011-11-07 NOTE — Patient Instructions (Signed)
We will make a referral to a pain specialist for your continuing pain in the left armpit area

## 2011-11-07 NOTE — Progress Notes (Signed)
NAMEAKARI Melendez [facility]       DOB: 1950-10-27           DATE: 11/07/2011       MRN: [id number]   Shirley Melendez is a 61 y.o.Marland Kitchenfemale who presents for routine followup of her Left breast cancer with axillary recurrence diagnosed in 2012 and treated with axillary dissectin. She hcontinues to have some axillary pain and some lymphedems  PFSH: She has had no significant changes since the last visit here.  ROS: There have been no significant changes since the last visit here  EXAM: General: The patient is alert, oriented, generally healty appearing, NAD. Mood and affect are normal.  Breasts:  Right is normal. Left shows lumpectomy changes medially.   Lymphatics: She has no axillary or supraclavicular adenopathy on either side.  Extremities: Full ROM of the surgical side Right arm lymphedema.  Data Reviewed: No new data except mammogram in may was negative  Impression: Doing well, with no evidence of recurrent cancer or new cancer  Plan: Will continue to follow up on an annual basis here. Will make pain clinic referral for continued axillary pain

## 2011-11-07 NOTE — Progress Notes (Signed)
Addended by: Ethlyn Gallery on: 11/07/2011 04:26 PM   Modules accepted: Orders

## 2011-11-28 ENCOUNTER — Encounter: Payer: Self-pay | Admitting: Internal Medicine

## 2011-11-28 ENCOUNTER — Ambulatory Visit (INDEPENDENT_AMBULATORY_CARE_PROVIDER_SITE_OTHER): Payer: BC Managed Care – PPO | Admitting: Internal Medicine

## 2011-11-28 VITALS — BP 140/90 | HR 104 | Temp 98.3°F | Resp 16 | Wt 110.6 lb

## 2011-11-28 DIAGNOSIS — M949 Disorder of cartilage, unspecified: Secondary | ICD-10-CM

## 2011-11-28 DIAGNOSIS — M899 Disorder of bone, unspecified: Secondary | ICD-10-CM

## 2011-11-28 NOTE — Progress Notes (Signed)
  Subjective:    Patient ID: Shirley Melendez, female    DOB: [birth year], 61 y.o.   MRN: [id number]  HPI She is here to discuss the osteopenia documented on a minimal density. That study 10/14/11 revealed a T score of -2.2 at the femoral neck. The images were reviewed; there is significant  scoliosis which invalidates the T score data at the spine.  She has never taken bone building therapy per se   She is on vitamin D and calcium; vitamin D. level is therapeutic at 56. Significantly she is on Arimidex. She is scheduled to see her oncologist later this month. There has been no evidence of any bony involvement from the breast cancer.    Review of Systems she does have intermittent discomfort in her neck, hands and low back for which she takes Advil as needed. There is a family history of degenerative joint disease as well     Objective:   Physical Exam  She appears healthy and well-nourished in no distress.  There is slight decrease lateral range of motion of the neck bilaterally.  She has mild PIP osteoarthritic changes in hands  Otherwise range of motion in all extremities is good. There is no crepitus or effusion present.  Despite the significant scoliosis on bone mineral density; this is not clinically apparent on observation the spine  Strength and tone are normal        Assessment & Plan:

## 2011-11-28 NOTE — Assessment & Plan Note (Signed)
A copy of this report will be given Shirley Melendez to review with her Oncologist, Dr. Truett Perna. Zometa or Reclast could be considered; Zometa may be available at the Cancer Clinic. Vitamin D level should be monitored annually. No change in the present dose is recommended

## 2011-11-28 NOTE — Patient Instructions (Signed)
Share results with Dr Truett Perna

## 2011-12-10 ENCOUNTER — Ambulatory Visit (HOSPITAL_BASED_OUTPATIENT_CLINIC_OR_DEPARTMENT_OTHER): Payer: BC Managed Care – PPO | Admitting: Oncology

## 2011-12-10 ENCOUNTER — Telehealth: Payer: Self-pay | Admitting: Oncology

## 2011-12-10 VITALS — BP 151/89 | HR 92 | Temp 97.1°F | Ht 60.0 in | Wt 109.9 lb

## 2011-12-10 DIAGNOSIS — I890 Lymphedema, not elsewhere classified: Secondary | ICD-10-CM

## 2011-12-10 DIAGNOSIS — C773 Secondary and unspecified malignant neoplasm of axilla and upper limb lymph nodes: Secondary | ICD-10-CM

## 2011-12-10 DIAGNOSIS — M899 Disorder of bone, unspecified: Secondary | ICD-10-CM

## 2011-12-10 DIAGNOSIS — Z853 Personal history of malignant neoplasm of breast: Secondary | ICD-10-CM

## 2011-12-10 NOTE — Telephone Encounter (Signed)
Gave pt appt schedule for December. Order did not cross over. Per BS 22mo f/u only.

## 2011-12-10 NOTE — Progress Notes (Signed)
   [facility]    OFFICE PROGRESS NOTE   INTERVAL HISTORY:   She returns as scheduled. She continues arimidex. She has noted increased hot flashes since beginning this medication.  A bone density scan on 10/29/2011 found a T score of -2.2. She is taking calcium and vitamin D.  Ms.Gross has persistent edema of the left arm and hand. She is wearing a lymphedema sleeve. There is pain at the left upper arm and in the axilla. She is now being seen at the pain clinic. She started Cymbalta a few days ago. A mammogram and left breast ultrasound on 09/04/2011 revealed no evidence of malignancy. Postlumpectomy scarring was noted in the medial left breast.  Objective:  Vital signs in last 24 hours:  Blood pressure 151/89, pulse 92, temperature 97.1 F (36.2 C), temperature source Oral, height 5' (1.524 m), weight 109 lb 14.4 oz (49.85 kg).    HEENT: Neck without Lymphatics: No cervical, supraclavicular, or axillary nodes Resp: Few end inspiratory rhonchi at the upper posterior chest bilaterally, no respiratory distress Cardio: Regular rate and rhythm GI: No hepatomegaly Vascular: No leg edema, lymphedema sleeve in place over the left arm and hand Breasts: Status post left lumpectomy. Firm tissue at the lumpectomy scar. Examination of the left axilla is unremarkable.    Medications: I have reviewed the patient's current medications.  Assessment/Plan: 1. Stage I (T1 NX) left-sided breast cancer diagnosed in April 1997. She was treated with a lumpectomy, left breast radiation, CMF chemotherapy, and 5 years of tamoxifen. 2. Metastatic carcinoma involving a left axillary lymph node, ER-positive, PR-positive, and HER-2-negative. She is status post a biopsy of a left axillary lymph node 08/13/2010. Status post a left axillary lymph node dissection 09/13/2010 with the pathology confirming 3/11 axillary lymph nodes containing metastatic carcinoma. A staging PET scan on 08/23/2010  confirmed a hypermetabolic left axillary lymph node and no other evidence of metastatic disease.  a. Status post 4 cycles of "adjuvant" T-C chemotherapy with cycle #4 given on 01/07/2011. b. She began Arimidex on 03/26/2011 3. Left arm lymphedema, likely lymphedema related to the left axillary lymph node dissection. She is now followed at the lymphedema clinic. Improved with physical therapy and a lymphedema sleeve. 4. Pain and numbness at the fingers bilaterally-right greater than left, she reports being diagnosed with carpal tunnel syndrome and underwent a right sided carpal tunnel surgery with clinical improvement 5. Osteopenia-I discussed the bone building therapy with the patient and her husband today. She would like to discuss this further with Dr. Alwyn Ren. We will be glad to administer bone building therapy at the cancer Center if she decides to proceed.   Disposition:  She remains in clinical remission from breast cancer. She will continue arimidex and return for an office visit in 6 months.   Thornton Papas, MD  12/10/2011  12:46 PM

## 2011-12-23 ENCOUNTER — Encounter: Payer: Self-pay | Admitting: Internal Medicine

## 2012-01-08 ENCOUNTER — Telehealth: Payer: Self-pay | Admitting: *Deleted

## 2012-01-08 NOTE — Telephone Encounter (Signed)
Pt states that she has spoken with oncologist and they advised that they will leave issue about bone density treatment with Dr hopper and which ever suggests treatment will be fine. Per Pt they do have these treatment there as well if  hopp would like for them to set up we will just have to send them a order. Please advise

## 2012-01-08 NOTE — Telephone Encounter (Signed)
Spoke with Archie Patten at oncology will forward message to Dr Truett Perna, to see what his suggestion are.

## 2012-01-08 NOTE — Telephone Encounter (Signed)
Please verify which parenteral agent( ? Reclast or Prolia) the Cancer Center administers and schedule it there as an official order

## 2012-01-08 NOTE — Telephone Encounter (Signed)
Call from [facility] with Dr. Alwyn Ren asking which bone building therapy is offered at the [facility]. Pt would like to have this done here. Will review with Dr. Truett Perna for orders.

## 2012-01-10 ENCOUNTER — Other Ambulatory Visit: Payer: Self-pay | Admitting: *Deleted

## 2012-01-10 DIAGNOSIS — M949 Disorder of cartilage, unspecified: Secondary | ICD-10-CM

## 2012-01-10 DIAGNOSIS — M899 Disorder of bone, unspecified: Secondary | ICD-10-CM

## 2012-01-10 NOTE — Progress Notes (Signed)
Per pharmacy: [facility] can provide Prolia 60 mg every 6 months or Zometa 4 mg 1-2/year. Will not be able to order Reclast (brand name). Per Dr. Truett Perna, schedule for Zometa in August.

## 2012-01-16 ENCOUNTER — Telehealth: Payer: Self-pay | Admitting: Oncology

## 2012-01-16 NOTE — Telephone Encounter (Signed)
s/w pt and she is aware of her 8/12 appt  aom

## 2012-01-22 ENCOUNTER — Telehealth: Payer: Self-pay | Admitting: *Deleted

## 2012-01-22 NOTE — Telephone Encounter (Signed)
Message from pt asking if Zometa is compatible with her current medications? Has questions about med. Returned call, OK to take Zometa with current meds. Side effects reviewed including risk for osteonecrosis of the jaw. Pt understands to notify dentist that she is taking Zometa. Pt understands to call office with any further questions.

## 2012-01-26 ENCOUNTER — Other Ambulatory Visit: Payer: Self-pay | Admitting: Oncology

## 2012-01-27 ENCOUNTER — Other Ambulatory Visit: Payer: BC Managed Care – PPO | Admitting: Lab

## 2012-01-27 ENCOUNTER — Ambulatory Visit (HOSPITAL_BASED_OUTPATIENT_CLINIC_OR_DEPARTMENT_OTHER): Payer: BC Managed Care – PPO

## 2012-01-27 VITALS — BP 152/91 | HR 98 | Temp 97.0°F | Resp 18

## 2012-01-27 DIAGNOSIS — M899 Disorder of bone, unspecified: Secondary | ICD-10-CM

## 2012-01-27 DIAGNOSIS — M949 Disorder of cartilage, unspecified: Secondary | ICD-10-CM

## 2012-01-27 LAB — BASIC METABOLIC PANEL
CO2: 30 mEq/L (ref 19–32)
Chloride: 100 mEq/L (ref 96–112)
Creatinine, Ser: 0.83 mg/dL (ref 0.50–1.10)
Potassium: 3.6 mEq/L (ref 3.5–5.3)
Sodium: 138 mEq/L (ref 135–145)

## 2012-01-27 MED ORDER — ZOLEDRONIC ACID 4 MG/100ML IV SOLN
4.0000 mg | Freq: Once | INTRAVENOUS | Status: AC
  Administered 2012-01-27: 4 mg via INTRAVENOUS
  Filled 2012-01-27: qty 100

## 2012-01-27 MED ORDER — SODIUM CHLORIDE 0.9 % IV SOLN
Freq: Once | INTRAVENOUS | Status: AC
  Administered 2012-01-27: 16:00:00 via INTRAVENOUS

## 2012-01-28 ENCOUNTER — Telehealth: Payer: Self-pay | Admitting: *Deleted

## 2012-01-28 NOTE — Telephone Encounter (Signed)
Message copied by Augusto Garbe on Tue Jan 28, 2012 11:41 AM ------      Message from: Reesa Chew      Created: Mon Jan 27, 2012  5:04 PM      Regarding:  follow up call       1st zometa

## 2012-01-28 NOTE — Telephone Encounter (Signed)
Reached patient at 2:20 pm.  Reports she was not feeling good.  Noted this morning something was going on but had another doctor appointment and had to go out.  Body feels achy.  Muscles and joints ache too.  Checked temperature about 12:30 pm which = 100.6.  Has taken tylenol, ate a scrambled egg and juice about this time.  Also felt cold.  Will notify providers.  Denies n/v or any other side effects from zometa.  Reports IV site looks good without redness, swelling or pain.

## 2012-01-28 NOTE — Telephone Encounter (Signed)
Spoke with patient's spouse.  Reports she seems to be doing okay but he'll let her know I called.  Asked that he ask her to call for update or will try later to reach her.

## 2012-01-28 NOTE — Telephone Encounter (Signed)
Verbal order received and read back from Dr. Truett Perna for pt. To take tylenol or ibuprofen.  This is a very common side effect with zometa and should go away on its own in a few days.  Called Ms. Brabender with this information.  She asked how will she know if it's going away.  Expressed that her temperature may fluctuate.  Take two tylenol or ibuprofen every six hours to help symptoms.  If she notices temp going up, getting higher and staying high to call for further evaluation on Friday.

## 2012-02-06 ENCOUNTER — Ambulatory Visit
Admission: RE | Admit: 2012-02-06 | Discharge: 2012-02-06 | Disposition: A | Discharge status: Home / Self Care | Payer: BC Managed Care – PPO | Source: Ambulatory Visit | Attending: Physical Medicine and Rehabilitation | Admitting: Physical Medicine and Rehabilitation

## 2012-02-06 ENCOUNTER — Other Ambulatory Visit: Payer: Self-pay | Admitting: Physical Medicine and Rehabilitation

## 2012-02-06 DIAGNOSIS — R52 Pain, unspecified: Secondary | ICD-10-CM

## 2012-04-15 ENCOUNTER — Other Ambulatory Visit: Payer: Self-pay | Admitting: *Deleted

## 2012-04-15 ENCOUNTER — Other Ambulatory Visit: Payer: Self-pay | Admitting: Oncology

## 2012-04-15 DIAGNOSIS — Z853 Personal history of malignant neoplasm of breast: Secondary | ICD-10-CM

## 2012-04-15 MED ORDER — ANASTROZOLE 1 MG PO TABS: 1.0000 mg | ORAL_TABLET | Freq: Every day | ORAL | Status: DC

## 2012-05-05 ENCOUNTER — Ambulatory Visit (INDEPENDENT_AMBULATORY_CARE_PROVIDER_SITE_OTHER): Payer: BC Managed Care – PPO | Admitting: Surgery

## 2012-05-05 ENCOUNTER — Encounter (INDEPENDENT_AMBULATORY_CARE_PROVIDER_SITE_OTHER): Payer: Self-pay | Admitting: Surgery

## 2012-05-05 VITALS — BP 122/76 | HR 76 | Temp 97.8°F | Resp 16 | Ht 60.0 in | Wt 110.8 lb

## 2012-05-05 DIAGNOSIS — Z853 Personal history of malignant neoplasm of breast: Secondary | ICD-10-CM

## 2012-05-05 NOTE — Patient Instructions (Signed)
Continue annual mammograms We will see you again on an as needed basis. Please call the office at [phone] if you have any questions or concerns. Thank you for allowing us to take care of you.  

## 2012-05-05 NOTE — Progress Notes (Signed)
[name] [facility]       DOB: Jan 11, 1951           DATE: 05/05/2012       MRN: [id number]   Shirley Melendez is a 61 y.o.Marland Kitchenfemale who presents for routine followup of her Left breast cancer with axillary recurrence diagnosed in 2012 and treated with axillary dissection. She continues to have some axillary pain and some lymphedema. The pain clinic is working with her and she is improved on Cymbalta  PFSH: She has had no significant changes since the last visit here.  ROS: There have been no significant changes since the last visit here  EXAM: General: The patient is alert, oriented, generally healty appearing, NAD. Mood and affect are normal.  Breasts:  Right is normal. Left shows lumpectomy changes medially.   Lymphatics: She has no axillary or supraclavicular adenopathy on either side.  Extremities: Full ROM of the surgical side Right arm lymphedema.  Data Reviewed: No new data   Impression: Doing well, with no evidence of recurrent cancer or new cancer  Plan: She will see me again PRN as she is doing close follow up with Dr Truett Perna.

## 2012-06-12 ENCOUNTER — Telehealth: Payer: Self-pay | Admitting: Oncology

## 2012-06-12 ENCOUNTER — Telehealth: Payer: Self-pay | Admitting: *Deleted

## 2012-06-12 ENCOUNTER — Ambulatory Visit (HOSPITAL_BASED_OUTPATIENT_CLINIC_OR_DEPARTMENT_OTHER): Payer: BC Managed Care – PPO | Admitting: Oncology

## 2012-06-12 VITALS — BP 142/99 | HR 118 | Temp 98.5°F | Resp 20 | Ht 60.0 in | Wt 110.4 lb

## 2012-06-12 DIAGNOSIS — M949 Disorder of cartilage, unspecified: Secondary | ICD-10-CM

## 2012-06-12 DIAGNOSIS — M899 Disorder of bone, unspecified: Secondary | ICD-10-CM

## 2012-06-12 DIAGNOSIS — C50219 Malignant neoplasm of upper-inner quadrant of unspecified female breast: Secondary | ICD-10-CM

## 2012-06-12 DIAGNOSIS — I890 Lymphedema, not elsewhere classified: Secondary | ICD-10-CM

## 2012-06-12 DIAGNOSIS — C773 Secondary and unspecified malignant neoplasm of axilla and upper limb lymph nodes: Secondary | ICD-10-CM

## 2012-06-12 NOTE — Telephone Encounter (Signed)
Per staff message and POF I have scheduled appts.  JMW  

## 2012-06-12 NOTE — Progress Notes (Signed)
   [facility]    OFFICE PROGRESS NOTE   INTERVAL HISTORY:   She returns as scheduled. She reports stable edema of the left arm. She continues Arimidex. No hot flashes. Mild arthralgias. Cymbalta has helped the left arm pain. No change over either breast.  Objective:  Vital signs in last 24 hours:  Blood pressure 142/99, pulse 118, temperature 98.5 F (36.9 C), temperature source Oral, resp. rate 20, height 5' (1.524 m), weight 110 lb 6.4 oz (50.077 kg). repeat manual pulse 100    HEENT: Neck without mass Lymphatics: No cervical, supra-clavicular, or axillary nodes Resp: Lungs clear bilaterally Cardio: Regular rate and rhythm GI: No hepatomegaly Vascular: No leg edema, lymphedema sleeve in place over the left arm and hand Breast: Status post left lumpectomy. Firm tissue at the lumpectomy scar. Dense tissue at the upper outer right breast without a discrete mass.      Medications: I have reviewed the patient's current medications.  Assessment/Plan: 1. Stage I (T1 NX) left-sided breast cancer diagnosed in April 1997. She was treated with a lumpectomy, left breast radiation, CMF chemotherapy, and 5 years of tamoxifen. 2. Metastatic carcinoma involving a left axillary lymph node, ER-positive, PR-positive, and HER-2-negative. She is status post a biopsy of a left axillary lymph node 08/13/2010. Status post a left axillary lymph node dissection 09/13/2010 with the pathology confirming 3/11 axillary lymph nodes containing metastatic carcinoma. A staging PET scan on 08/23/2010 confirmed a hypermetabolic left axillary lymph node and no other evidence of metastatic disease.  a. Status post 4 cycles of "adjuvant" T-C chemotherapy with cycle #4 given on 01/07/2011. b. She began Arimidex on 03/26/2011 3. Left arm lymphedema, likely lymphedema related to the left axillary lymph node dissection. She is now followed at the lymphedema clinic. Improved with physical therapy and a  lymphedema sleeve. 4. Pain and numbness at the fingers bilaterally-right greater than left, she reports being diagnosed with carpal tunnel syndrome and underwent a right sided carpal tunnel surgery with clinical improvement  5. Osteopenia-she received Zometa in August of 2013  Disposition:  She remains in clinical remission from breast cancer. Ms. Cartelli will schedule a mammogram for March of 2014. She will return for an office visit and Zometa in 6 months.   Thornton Papas, MD  06/12/2012  2:03 PM

## 2012-06-12 NOTE — Progress Notes (Signed)
Stable lymphedema left arm-uses sleeve

## 2012-06-12 NOTE — Telephone Encounter (Signed)
gv and printed appt schedule for July 10.2014....emialed michelle to add tx...the patient aware

## 2012-07-15 ENCOUNTER — Other Ambulatory Visit: Payer: Self-pay | Admitting: Oncology

## 2012-08-01 ENCOUNTER — Other Ambulatory Visit: Payer: Self-pay

## 2012-08-17 ENCOUNTER — Other Ambulatory Visit: Payer: Self-pay | Admitting: Oncology

## 2012-08-17 DIAGNOSIS — Z853 Personal history of malignant neoplasm of breast: Secondary | ICD-10-CM

## 2012-08-17 DIAGNOSIS — M899 Disorder of bone, unspecified: Secondary | ICD-10-CM

## 2012-09-02 ENCOUNTER — Other Ambulatory Visit: Payer: Self-pay | Admitting: Obstetrics and Gynecology

## 2012-09-02 DIAGNOSIS — Z853 Personal history of malignant neoplasm of breast: Secondary | ICD-10-CM

## 2012-09-15 ENCOUNTER — Ambulatory Visit
Admission: RE | Admit: 2012-09-15 | Discharge: 2012-09-15 | Disposition: A | Discharge status: Home / Self Care | Payer: BC Managed Care – PPO | Source: Ambulatory Visit | Attending: Obstetrics and Gynecology | Admitting: Obstetrics and Gynecology

## 2012-09-15 DIAGNOSIS — Z853 Personal history of malignant neoplasm of breast: Secondary | ICD-10-CM

## 2012-12-21 ENCOUNTER — Telehealth: Payer: Self-pay | Admitting: *Deleted

## 2012-12-21 NOTE — Telephone Encounter (Signed)
Having oral surgery on growth under her tongue that does not involve any bone/teeth. Dr. Retta Mac said OK to do this in regards to her Zometa, but she wants confirmation from Dr. Truett Perna. Scheduled for Zometa on Thursday.

## 2012-12-23 ENCOUNTER — Other Ambulatory Visit: Payer: Self-pay | Admitting: Pharmacist

## 2012-12-23 DIAGNOSIS — M899 Disorder of bone, unspecified: Secondary | ICD-10-CM

## 2012-12-24 ENCOUNTER — Telehealth: Payer: Self-pay | Admitting: Oncology

## 2012-12-24 ENCOUNTER — Ambulatory Visit (HOSPITAL_BASED_OUTPATIENT_CLINIC_OR_DEPARTMENT_OTHER): Payer: BC Managed Care – PPO | Admitting: Oncology

## 2012-12-24 ENCOUNTER — Ambulatory Visit (HOSPITAL_BASED_OUTPATIENT_CLINIC_OR_DEPARTMENT_OTHER): Payer: BC Managed Care – PPO

## 2012-12-24 ENCOUNTER — Other Ambulatory Visit (HOSPITAL_BASED_OUTPATIENT_CLINIC_OR_DEPARTMENT_OTHER): Payer: BC Managed Care – PPO | Admitting: Lab

## 2012-12-24 VITALS — BP 161/83 | HR 107 | Temp 98.0°F | Resp 18 | Ht 60.0 in | Wt 110.8 lb

## 2012-12-24 DIAGNOSIS — Z853 Personal history of malignant neoplasm of breast: Secondary | ICD-10-CM

## 2012-12-24 DIAGNOSIS — C50919 Malignant neoplasm of unspecified site of unspecified female breast: Secondary | ICD-10-CM

## 2012-12-24 DIAGNOSIS — M949 Disorder of cartilage, unspecified: Secondary | ICD-10-CM

## 2012-12-24 DIAGNOSIS — C773 Secondary and unspecified malignant neoplasm of axilla and upper limb lymph nodes: Secondary | ICD-10-CM

## 2012-12-24 DIAGNOSIS — M899 Disorder of bone, unspecified: Secondary | ICD-10-CM

## 2012-12-24 DIAGNOSIS — Z170 Estrogen receptor positive status [ER+]: Secondary | ICD-10-CM

## 2012-12-24 LAB — BASIC METABOLIC PANEL (CC13)
BUN: 16.5 mg/dL (ref 7.0–26.0)
CO2: 27 mEq/L (ref 22–29)
Chloride: 105 mEq/L (ref 98–109)
Creatinine: 0.9 mg/dL (ref 0.6–1.1)
Glucose: 97 mg/dl (ref 70–140)

## 2012-12-24 MED ORDER — SODIUM CHLORIDE 0.9 % IV SOLN
Freq: Once | INTRAVENOUS | Status: AC
  Administered 2012-12-24: 16:00:00 via INTRAVENOUS

## 2012-12-24 MED ORDER — ZOLEDRONIC ACID 4 MG/100ML IV SOLN
4.0000 mg | Freq: Once | INTRAVENOUS | Status: AC
  Administered 2012-12-24: 4 mg via INTRAVENOUS
  Filled 2012-12-24: qty 100

## 2012-12-24 NOTE — Patient Instructions (Addendum)
Zoledronic Acid injection (Hypercalcemia, Oncology) What is this medicine? ZOLEDRONIC ACID (ZOE le dron ik AS id) lowers the amount of calcium loss from bone. It is used to treat too much calcium in your blood from cancer. It is also used to prevent complications of cancer that has spread to the bone. This medicine may be used for other purposes; ask your health care provider or pharmacist if you have questions. What should I tell my health care provider before I take this medicine? They need to know if you have any of these conditions: -aspirin-sensitive asthma -dental disease -kidney disease -an unusual or allergic reaction to zoledronic acid, other medicines, foods, dyes, or preservatives -pregnant or trying to get pregnant -breast-feeding How should I use this medicine? This medicine is for infusion into a vein. It is given by a health care professional in a hospital or clinic setting. Talk to your pediatrician regarding the use of this medicine in children. Special care may be needed. Overdosage: If you think you have taken too much of this medicine contact a poison control center or emergency room at once. NOTE: This medicine is only for you. Do not share this medicine with others. What if I miss a dose? It is important not to miss your dose. Call your doctor or health care professional if you are unable to keep an appointment. What may interact with this medicine? -certain antibiotics given by injection -NSAIDs, medicines for pain and inflammation, like ibuprofen or naproxen -some diuretics like bumetanide, furosemide -teriparatide -thalidomide This list may not describe all possible interactions. Give your health care provider a list of all the medicines, herbs, non-prescription drugs, or dietary supplements you use. Also tell them if you smoke, drink alcohol, or use illegal drugs. Some items may interact with your medicine. What should I watch for while using this medicine? Visit  your doctor or health care professional for regular checkups. It may be some time before you see the benefit from this medicine. Do not stop taking your medicine unless your doctor tells you to. Your doctor may order blood tests or other tests to see how you are doing. Women should inform their doctor if they wish to become pregnant or think they might be pregnant. There is a potential for serious side effects to an unborn child. Talk to your health care professional or pharmacist for more information. You should make sure that you get enough calcium and vitamin D while you are taking this medicine. Discuss the foods you eat and the vitamins you take with your health care professional. Some people who take this medicine have severe bone, joint, and/or muscle pain. This medicine may also increase your risk for a broken thigh bone. Tell your doctor right away if you have pain in your upper leg or groin. Tell your doctor if you have any pain that does not go away or that gets worse. What side effects may I notice from receiving this medicine? Side effects that you should report to your doctor or health care professional as soon as possible: -allergic reactions like skin rash, itching or hives, swelling of the face, lips, or tongue -anxiety, confusion, or depression -breathing problems -changes in vision -feeling faint or lightheaded, falls -jaw burning, cramping, pain -muscle cramps, stiffness, or weakness -trouble passing urine or change in the amount of urine Side effects that usually do not require medical attention (report to your doctor or health care professional if they continue or are bothersome): -bone, joint, or muscle pain -  fever -hair loss -irritation at site where injected -loss of appetite -nausea, vomiting -stomach upset -tired This list may not describe all possible side effects. Call your doctor for medical advice about side effects. You may report side effects to FDA at  1-800-FDA-1088. Where should I keep my medicine? This drug is given in a hospital or clinic and will not be stored at home. NOTE: This sheet is a summary. It may not cover all possible information. If you have questions about this medicine, talk to your doctor, pharmacist, or health care provider.  2013, [employer]vier/Gold Standard. (11/30/2010 9:06:58 AM)  

## 2012-12-24 NOTE — Progress Notes (Signed)
   [facility]    OFFICE PROGRESS NOTE   INTERVAL HISTORY:   She returns as scheduled. She continues Arimidex. Minimal hot flashes. She has arthritis in the fingers. She feels well. Stable left arm lymphedema. She is scheduled for a biopsy of an ulcerated area at the right side of the tongue.  A bilateral mammogram on 09/15/2012 was negative.  Objective:  Vital signs in last 24 hours:  Blood pressure 161/83, pulse 107, temperature 98 F (36.7 C), temperature source Oral, resp. rate 18, height 5' (1.524 m), weight 110 lb 12.8 oz (50.259 kg).    HEENT: 1 cm area of white discoloration and superficial ulceration at the right side of the tongue Lymphatics: No cervical, supraclavicular, or axillary nodes Resp: Lungs clear bilaterally Cardio: Regular rate and rhythm GI: No hepatomegaly Vascular: No leg edema, lymphedema sleeve in place at the left arm and hand Breast: Status post left lumpectomy. There is firm tissue at the lumpectomy scar. No mass in either breast.    Medications: I have reviewed the patient's current medications.  Assessment/Plan: 1. Stage I (T1 NX) left-sided breast cancer diagnosed in April 1997. She was treated with a lumpectomy, left breast radiation, CMF chemotherapy, and 5 years of tamoxifen. 2. Metastatic carcinoma involving a left axillary lymph node, ER-positive, PR-positive, and HER-2-negative. She is status post a biopsy of a left axillary lymph node 08/13/2010. Status post a left axillary lymph node dissection 09/13/2010 with the pathology confirming 3/11 axillary lymph nodes containing metastatic carcinoma. A staging PET scan on 08/23/2010 confirmed a hypermetabolic left axillary lymph node and no other evidence of metastatic disease.  a. Status post 4 cycles of "adjuvant" T-C chemotherapy with cycle #4 given on 01/07/2011. b. She began Arimidex on 03/26/2011 3. Left arm lymphedema, likely lymphedema related to the left axillary lymph node  dissection. She is now followed at the lymphedema clinic. Improved with physical therapy and a lymphedema sleeve. 4. Pain and numbness at the fingers bilaterally-right greater than left, she reports being diagnosed with carpal tunnel syndrome and underwent a right sided carpal tunnel surgery with clinical improvement  5. Osteopenia-she received Zometa in August of 2013 , she will receive Zometa again today   Disposition:  She remains in clinical remission from breast cancer. She will continue Arimidex. She will receive Zometa today and again in one year. Ms. Eunice will return for an office visit in 6 months.   Thornton Papas, MD  12/24/2012  3:43 PM

## 2012-12-24 NOTE — Telephone Encounter (Signed)
gv and printed appt sched and avs for pt  °

## 2013-04-22 ENCOUNTER — Other Ambulatory Visit: Payer: Self-pay

## 2013-06-28 ENCOUNTER — Ambulatory Visit (HOSPITAL_BASED_OUTPATIENT_CLINIC_OR_DEPARTMENT_OTHER): Payer: BC Managed Care – PPO | Admitting: Oncology

## 2013-06-28 ENCOUNTER — Telehealth: Payer: Self-pay | Admitting: Oncology

## 2013-06-28 VITALS — BP 168/92 | HR 112 | Temp 99.1°F | Resp 18 | Ht 60.0 in | Wt 111.3 lb

## 2013-06-28 DIAGNOSIS — C773 Secondary and unspecified malignant neoplasm of axilla and upper limb lymph nodes: Secondary | ICD-10-CM

## 2013-06-28 DIAGNOSIS — C50919 Malignant neoplasm of unspecified site of unspecified female breast: Secondary | ICD-10-CM

## 2013-06-28 DIAGNOSIS — M899 Disorder of bone, unspecified: Secondary | ICD-10-CM

## 2013-06-28 DIAGNOSIS — R209 Unspecified disturbances of skin sensation: Secondary | ICD-10-CM

## 2013-06-28 DIAGNOSIS — I10 Essential (primary) hypertension: Secondary | ICD-10-CM

## 2013-06-28 DIAGNOSIS — I890 Lymphedema, not elsewhere classified: Secondary | ICD-10-CM

## 2013-06-28 DIAGNOSIS — M949 Disorder of cartilage, unspecified: Secondary | ICD-10-CM

## 2013-06-28 DIAGNOSIS — Z853 Personal history of malignant neoplasm of breast: Secondary | ICD-10-CM

## 2013-06-28 MED ORDER — ANASTROZOLE 1 MG PO TABS: ORAL_TABLET | ORAL | Status: DC

## 2013-06-28 NOTE — Telephone Encounter (Signed)
gv and printed appt sched and avs for pt for July....sed added tx. °

## 2013-06-28 NOTE — Progress Notes (Signed)
   Shirley Melendez    OFFICE PROGRESS NOTE   INTERVAL HISTORY:   Returns for scheduled followup of breast cancer. She continues Arimidex. Minimal hot flashes and arthralgias. Stable left arm lymphedema. She tolerated the Zometa well in July.  Objective:  Vital signs in last 24 hours:  Blood pressure 153/103, pulse 112, temperature 99.1 F (37.3 C), temperature source Oral, resp. rate 18, height 5' (1.524 m), weight 111 lb 4.8 oz (50.485 kg). repeat manual blood pressure 168/92    HEENT: Neck without mass Lymphatics: No cervical, supraclavicular, or axillary nodes. Resp: Lungs clear bilaterally Cardio: Regular rate and rhythm GI: No hepatomegaly Vascular: No leg edema, lymphedema sleeve in place at the left arm and hand Breast: Status post left lumpectomy. Firm tissue at the lumpectomy scar. No mass in either breast.      Medications: I have reviewed the patient's current medications.  Assessment/Plan: 1. Stage I (T1 NX) left-sided breast cancer diagnosed in April 1997. She was treated with a lumpectomy, left breast radiation, CMF chemotherapy, and 5 years of tamoxifen. 2. Metastatic carcinoma involving a left axillary lymph node, ER-positive, PR-positive, and HER-2-negative. She is status post a biopsy of a left axillary lymph node 08/13/2010. Status post a left axillary lymph node dissection 09/13/2010 with the pathology confirming 3/11 axillary lymph nodes containing metastatic carcinoma. A staging PET scan on 08/23/2010 confirmed a hypermetabolic left axillary lymph node and no other evidence of metastatic disease.  a. Status post 4 cycles of "adjuvant" T-C chemotherapy with cycle #4 given on 01/07/2011. b. She began Arimidex on 03/26/2011 3. Left arm lymphedema, likely lymphedema related to the left axillary lymph node dissection. She is now followed at the lymphedema clinic. Improved with physical therapy and a lymphedema sleeve.       4.   Pain and numbness at  the fingers bilaterally-right greater than left, she reports being diagnosed with carpal tunnel syndrome and             underwent a right sided carpal tunnel surgery with clinical improvement        5.   Osteopenia-she received Zometa in July of 2014-no tooth/jaw symptoms       6.   Hypertension-we recommended she followup with Dr. Linna Darner  Disposition:  Shirley Melendez remains in clinical remission from breast cancer. She will continue Arimidex. She will return for an office visit and Zometa in 6 months. She will schedule a mammogram for April of this year. She also plans to schedule a bone density scan via Dr. Linna Darner.   Shirley Coder, MD  06/28/2013  3:35 PM

## 2013-07-15 ENCOUNTER — Telehealth: Payer: Self-pay

## 2013-07-15 ENCOUNTER — Ambulatory Visit (INDEPENDENT_AMBULATORY_CARE_PROVIDER_SITE_OTHER): Payer: BC Managed Care – PPO | Admitting: Physician Assistant

## 2013-07-15 ENCOUNTER — Encounter: Payer: Self-pay | Admitting: Physician Assistant

## 2013-07-15 VITALS — BP 158/96 | HR 120 | Temp 98.6°F | Resp 14 | Ht 60.0 in | Wt 109.8 lb

## 2013-07-15 DIAGNOSIS — I10 Essential (primary) hypertension: Secondary | ICD-10-CM

## 2013-07-15 DIAGNOSIS — Z136 Encounter for screening for cardiovascular disorders: Secondary | ICD-10-CM

## 2013-07-15 MED ORDER — METOPROLOL TARTRATE 50 MG PO TABS: 50.0000 mg | ORAL_TABLET | Freq: Two times a day (BID) | ORAL | Status: DC

## 2013-07-15 NOTE — Patient Instructions (Signed)
Please take 1/2 tablet twice daily for 4-5 days.  Then increase to 1 tablet (50 mg) twice daily  Check your blood pressure once daily.  Follow-up with Dr. Linna Darner for a blood pressure recheck and for a complete physical.  If you develop palpitations or chest pain, please proceed to the ER.  Hypertension As your heart beats, it forces blood through your arteries. This force is your blood pressure. If the pressure is too high, it is called hypertension (HTN) or high blood pressure. HTN is dangerous because you may have it and not know it. High blood pressure may mean that your heart has to work harder to pump blood. Your arteries may be narrow or stiff. The extra work puts you at risk for heart disease, stroke, and other problems.  Blood pressure consists of two numbers, a higher number over a lower, 110/72, for example. It is stated as "110 over 72." The ideal is below 120 for the top number (systolic) and under 80 for the bottom (diastolic). Write down your blood pressure today. You should pay close attention to your blood pressure if you have certain conditions such as:  Heart failure.  Prior heart attack.  Diabetes  Chronic kidney disease.  Prior stroke.  Multiple risk factors for heart disease. To see if you have HTN, your blood pressure should be measured while you are seated with your arm held at the level of the heart. It should be measured at least twice. A one-time elevated blood pressure reading (especially in the Emergency Department) does not mean that you need treatment. There may be conditions in which the blood pressure is different between your right and left arms. It is important to see your caregiver soon for a recheck. Most people have essential hypertension which means that there is not a specific cause. This type of high blood pressure may be lowered by changing lifestyle factors such as:  Stress.  Smoking.  Lack of exercise.  Excessive weight.  Drug/tobacco/alcohol  use.  Eating less salt. Most people do not have symptoms from high blood pressure until it has caused damage to the body. Effective treatment can often prevent, delay or reduce that damage. TREATMENT  When a cause has been identified, treatment for high blood pressure is directed at the cause. There are a large number of medications to treat HTN. These fall into several categories, and your caregiver will help you select the medicines that are best for you. Medications may have side effects. You should review side effects with your caregiver. If your blood pressure stays high after you have made lifestyle changes or started on medicines,   Your medication(s) may need to be changed.  Other problems may need to be addressed.  Be certain you understand your prescriptions, and know how and when to take your medicine.  Be sure to follow up with your caregiver within the time frame advised (usually within two weeks) to have your blood pressure rechecked and to review your medications.  If you are taking more than one medicine to lower your blood pressure, make sure you know how and at what times they should be taken. Taking two medicines at the same time can result in blood pressure that is too low. SEEK IMMEDIATE MEDICAL CARE IF:  You develop a severe headache, blurred or changing vision, or confusion.  You have unusual weakness or numbness, or a faint feeling.  You have severe chest or abdominal pain, vomiting, or breathing problems. MAKE SURE YOU:  Understand these instructions.  Will watch your condition.  Will get help right away if you are not doing well or get worse. Document Released: 06/03/2005 Document Revised: 08/26/2011 Document Reviewed: 01/22/2008 [facility] Patient Information 2014 [place].  DASH Diet The DASH diet stands for "Dietary Approaches to Stop Hypertension." It is a healthy eating plan that has been shown to reduce high blood pressure (hypertension) in as  little as 14 days, while also possibly providing other significant health benefits. These other health benefits include reducing the risk of breast cancer after menopause and reducing the risk of type 2 diabetes, heart disease, colon cancer, and stroke. Health benefits also include weight loss and slowing kidney failure in patients with chronic kidney disease.  DIET GUIDELINES  Limit salt (sodium). Your diet should contain less than 1500 mg of sodium daily.  Limit refined or processed carbohydrates. Your diet should include mostly whole grains. Desserts and added sugars should be used sparingly.  Include small amounts of heart-healthy fats. These types of fats include nuts, oils, and tub margarine. Limit saturated and trans fats. These fats have been shown to be harmful in the body. CHOOSING FOODS  The following food groups are based on a 2000 calorie diet. See your Registered Dietitian for individual calorie needs. Grains and Grain Products (6 to 8 servings daily)  Eat More Often: Whole-wheat bread, brown rice, whole-grain or wheat pasta, quinoa, popcorn without added fat or salt (air popped).  Eat Less Often: White bread, white pasta, white rice, cornbread. Vegetables (4 to 5 servings daily)  Eat More Often: Fresh, frozen, and canned vegetables. Vegetables may be raw, steamed, roasted, or grilled with a minimal amount of fat.  Eat Less Often/Avoid: Creamed or fried vegetables. Vegetables in a cheese sauce. Fruit (4 to 5 servings daily)  Eat More Often: All fresh, canned (in natural juice), or frozen fruits. Dried fruits without added sugar. One hundred percent fruit juice ( cup [237 mL] daily).  Eat Less Often: Dried fruits with added sugar. Canned fruit in light or heavy syrup. [employer], Fish, and Poultry (2 servings or less daily. One serving is 3 to 4 oz [85-114 g]).  Eat More Often: Ninety percent or leaner ground beef, tenderloin, sirloin. Round cuts of beef, chicken breast,  [place] breast. All fish. Grill, bake, or broil your meat. Nothing should be fried.  Eat Less Often/Avoid: Fatty cuts of meat, [place], or chicken leg, thigh, or wing. Fried cuts of meat or fish. Dairy (2 to 3 servings)  Eat More Often: Low-fat or fat-free milk, low-fat plain or light yogurt, reduced-fat or part-skim cheese.  Eat Less Often/Avoid: Milk (whole, 2%).Whole milk yogurt. Full-fat cheeses. Nuts, Seeds, and Legumes (4 to 5 servings per week)  Eat More Often: All without added salt.  Eat Less Often/Avoid: Salted nuts and seeds, canned beans with added salt. Fats and Sweets (limited)  Eat More Often: Vegetable oils, tub margarines without trans fats, sugar-free gelatin. Mayonnaise and salad dressings.  Eat Less Often/Avoid: Coconut oils, palm oils, butter, stick margarine, cream, half and half, cookies, candy, pie. FOR MORE INFORMATION The Dash Diet Eating Plan: www.dashdiet.org Document Released: 05/23/2011 Document Revised: 08/26/2011 Document Reviewed: 05/23/2011 [facility] Patient Information 2014 [place], [place].

## 2013-07-15 NOTE — Progress Notes (Signed)
Pre visit review using our clinic review tool, if applicable. No additional management support is needed unless otherwise documented below in the visit note/SLS  

## 2013-07-15 NOTE — Telephone Encounter (Signed)
Spouse called due to patient's nose bleeding since 730am today. Per Hopp send her to ENT. Appt through [facility] and patient's spouse notified. They will see her ASAP.

## 2013-07-16 DIAGNOSIS — I10 Essential (primary) hypertension: Secondary | ICD-10-CM | POA: Insufficient documentation

## 2013-07-16 HISTORY — DX: Essential (primary) hypertension: I10

## 2013-07-16 NOTE — Assessment & Plan Note (Signed)
Will begin patient on 50 mg tablet of Lopressor -- Patient instructed to take 25 mg BID x 1 week. Then increase to 50 mg BID. EKG revealed sinus tachycardia but no other worrisome findings.  Educated patient on possible contributing factors to her hypertension.  Patient declines labs at today's visit, stating she has scheduled an appointment with her PCP, Dr. Linna Darner for a physical.  Patient will need lipid panel and thyroid function testing.  Discussed low salt diet and daily exercise with patient. Instructed patient to purchase an at-home BP cuff and monitor BP once daily. Discussed alarm signs/symptoms with patient and her husband.

## 2013-07-16 NOTE — Progress Notes (Signed)
Patient presents to clinic today after seeing her ENT for a nosebleed.  Patient's BP was significantly elevated.  Patient told to schedule an appointment with her PCP, Dr. Unice Cobble.  Patient was unable to be scheduled at the [facility] office with her PCP, and was subsequently scheduled with this provider.  Patient denies history of hypertension.  Upon review of BP recordings within our system over the past year, patient's BP readings include 142/99, 161/83, 168/92 and 158/96.  Patient endorses intermittent headaches, blurry vision and nose bleeds.  Patient denies chest pain, palpitations shortness of breath, LH or dizziness.  Patient denies history of MI, CVA or TIA.  Patient denies history of high cholesterol.    EKG in clinic reveals sinus tachycardia.  No other findings.  Past Medical History  Diagnosis Date  . CTS (carpal tunnel syndrome) 2013    Dr [name] , Neurology  . Fasting hyperglycemia 2012    FBS 110  . Breast cancer 1997    surgery, chemo, & radiation  . Breast cancer 2012    surgery, chemo, & radiation with axillary dissection  . PONV (postoperative nausea and vomiting) 1997    no problems since  . 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 Arthritis     chronic neck and back pain  . MRSA (methicillin resistant staph aureus) culture positive 2012    swab was positive, treated per protocol.  No other issues.  . Osteoporosis     Current Outpatient Prescriptions on File Prior to Visit  Medication Sig Dispense Refill  . anastrozole (ARIMIDEX) 1 MG tablet TAKE 1 TABLET BY MOUTH DAILY  30 tablet  5  . aspirin 81 MG tablet Take 81 mg by mouth daily.        . Calcium Carbonate-Vitamin D (CALCIUM 600 + D PO) Take 1 capsule by mouth 2 (two) times daily. 600/400      . DULoxetine (CYMBALTA) 60 MG capsule Take 60 mg by mouth daily.      Marland Kitchen ibuprofen (ADVIL,MOTRIN) 200 MG tablet Take 200 mg by mouth every 6 (six) hours as  needed.        Marland Kitchen LYSINE PO Take 500 mg by mouth as needed.        . Multiple Vitamins-Minerals (SENIOR MULTIVITAMIN PLUS PO) Take by mouth daily.        Marland Kitchen oxyCODONE-acetaminophen (PERCOCET) 5-325 MG per tablet Take 1 tablet by mouth daily as needed.      . Vitamin E 200 UNITS TABS Take 200 Units by mouth daily.       No current facility-administered medications on file prior to visit.    Allergies  Allergen Reactions  . Compazine Other (See Comments)    Caused convulsions/seizure (severe)  . Erythromycin Nausea And Vomiting  . Prochlorperazine Edisylate     nausea    Family History  Problem Relation Age of Onset  . Hypertension Mother   . Arthritis Mother     OA  . Arthritis Maternal Grandfather     OA    History   Social History  . Marital Status: Married    Spouse Name: N/A    Number of Children: N/A  . Years of Education: N/A   Social History Main Topics  . Smoking status: Never Smoker   . Smokeless tobacco: Never Used  . Alcohol Use: No  . Drug Use: No  . Sexual Activity: None   Other Topics Concern  .  None   Social History Narrative  . None   Review of Systems - See HPI.  All other ROS are negative.  Filed Vitals:   07/15/13 1552  BP: 158/96  Pulse: 120  Temp: 98.6 F (37 C)  Resp: 14   Physical Exam  Vitals reviewed. Constitutional: She is oriented to person, place, and time and well-developed, well-nourished, and in no distress.  HENT:  Head: Normocephalic and atraumatic.  Eyes: Conjunctivae and EOM are normal. Pupils are equal, round, and reactive to light.  Neck: Neck supple. No thyromegaly present.  Cardiovascular: Normal rate, regular rhythm, normal heart sounds and intact distal pulses.   No murmur heard. Pulmonary/Chest: Effort normal and breath sounds normal. No respiratory distress. She has no wheezes. She has no rales. She exhibits no tenderness.  Lymphadenopathy:    She has no cervical adenopathy.  Neurological: She is alert and  oriented to person, place, and time. No cranial nerve deficit.  Skin: Skin is warm and dry. No rash noted.  Psychiatric: Affect normal.    No results found for this or any previous visit (from the past 2160 hour(s)).  Assessment/Plan: No problem-specific assessment & plan notes found for this encounter.

## 2013-07-29 ENCOUNTER — Encounter: Payer: Self-pay | Admitting: Internal Medicine

## 2013-07-29 ENCOUNTER — Ambulatory Visit (INDEPENDENT_AMBULATORY_CARE_PROVIDER_SITE_OTHER): Payer: BC Managed Care – PPO | Admitting: Internal Medicine

## 2013-07-29 VITALS — BP 140/86 | HR 88 | Temp 98.1°F | Resp 14 | Ht 60.0 in | Wt 111.5 lb

## 2013-07-29 DIAGNOSIS — I10 Essential (primary) hypertension: Secondary | ICD-10-CM

## 2013-07-29 MED ORDER — SPIRONOLACTONE 25 MG PO TABS: 25.0000 mg | ORAL_TABLET | Freq: Every day | ORAL | Status: DC

## 2013-07-29 NOTE — Assessment & Plan Note (Signed)
Add low dose diuretic

## 2013-07-29 NOTE — Progress Notes (Signed)
   Subjective:    Patient ID: Shirley Melendez, female    DOB: 1951/03/05, 63 y.o.   MRN: [id number]  HPI Blood pressure up in past 3-4 months in setting of stress Range 125/77-172/96  Compliant with anti hypertemsive medication. No lightheadedness or other adverse medication effect described.   K+ 3.7 in 7/14        Review of Systems Significant headaches, chest pain, palpitations, exertional dyspnea, claudication, paroxysmal nocturnal dyspnea, or edema absent. Some LUE lymphedema. Some epistaxis since RTI in January.Dr Constance Holster treated this with cautery.     Objective:   Physical Exam Appears thin but healthy and well-nourished & in no acute distress  No carotid bruits are present.No neck pain distention present at 10 - 15 degrees. Thyroid normal to palpation  Heart rhythm and rate are normal with no significant murmurs or gallops.S4  Chest is clear with no increased work of breathing  There is no evidence of aortic aneurysm or renal artery bruits  Abdomen soft with no organomegaly or masses. No HJR  No clubbing, cyanosis or edema present. Lymphedema LUE  Pedal pulses are intact but DPP decreased  No ischemic skin changes are present . Nails healthy w Alert and oriented. Strength, tone, DTRs reflexes normal          Assessment & Plan:  #1 HTN See plan

## 2013-07-29 NOTE — Patient Instructions (Signed)

## 2013-07-29 NOTE — Progress Notes (Signed)
Pre visit review using our clinic review tool, if applicable. No additional management support is needed unless otherwise documented below in the visit note/SLS  

## 2013-07-30 ENCOUNTER — Other Ambulatory Visit: Payer: Self-pay | Admitting: *Deleted

## 2013-07-30 DIAGNOSIS — I10 Essential (primary) hypertension: Secondary | ICD-10-CM

## 2013-07-30 MED ORDER — METOPROLOL TARTRATE 50 MG PO TABS: 50.0000 mg | ORAL_TABLET | Freq: Two times a day (BID) | ORAL | Status: DC

## 2013-09-01 ENCOUNTER — Other Ambulatory Visit: Payer: Self-pay | Admitting: Internal Medicine

## 2013-09-01 NOTE — Telephone Encounter (Signed)
Rx sent to the pharmacy by e-script.//AB/CMA 

## 2013-09-23 ENCOUNTER — Other Ambulatory Visit: Payer: Self-pay | Admitting: Internal Medicine

## 2013-09-23 ENCOUNTER — Other Ambulatory Visit: Payer: Self-pay | Admitting: Obstetrics and Gynecology

## 2013-09-23 DIAGNOSIS — Z9889 Other specified postprocedural states: Secondary | ICD-10-CM

## 2013-09-23 DIAGNOSIS — Z853 Personal history of malignant neoplasm of breast: Secondary | ICD-10-CM

## 2013-09-24 NOTE — Telephone Encounter (Signed)
Rx sent to the pharmacy by e-script.//AB/CMA 

## 2013-09-29 ENCOUNTER — Ambulatory Visit
Admission: RE | Admit: 2013-09-29 | Discharge: 2013-09-29 | Disposition: A | Discharge status: Home / Self Care | Payer: BC Managed Care – PPO | Source: Ambulatory Visit | Attending: Obstetrics and Gynecology | Admitting: Obstetrics and Gynecology

## 2013-09-29 DIAGNOSIS — Z853 Personal history of malignant neoplasm of breast: Secondary | ICD-10-CM

## 2013-09-29 DIAGNOSIS — Z9889 Other specified postprocedural states: Secondary | ICD-10-CM

## 2013-10-12 ENCOUNTER — Ambulatory Visit (HOSPITAL_COMMUNITY)
Admission: RE | Admit: 2013-10-12 | Discharge: 2013-10-12 | Disposition: A | Discharge status: Home / Self Care | Payer: BC Managed Care – PPO | Source: Ambulatory Visit | Attending: Physical Medicine and Rehabilitation | Admitting: Physical Medicine and Rehabilitation

## 2013-10-12 ENCOUNTER — Other Ambulatory Visit (HOSPITAL_COMMUNITY): Payer: Self-pay | Admitting: Physical Medicine and Rehabilitation

## 2013-10-12 DIAGNOSIS — R52 Pain, unspecified: Secondary | ICD-10-CM

## 2013-10-12 DIAGNOSIS — M25519 Pain in unspecified shoulder: Secondary | ICD-10-CM | POA: Insufficient documentation

## 2013-12-08 ENCOUNTER — Other Ambulatory Visit: Payer: Self-pay | Admitting: Oncology

## 2013-12-08 DIAGNOSIS — M948X9 Other specified disorders of cartilage, unspecified sites: Secondary | ICD-10-CM

## 2013-12-08 DIAGNOSIS — Z853 Personal history of malignant neoplasm of breast: Secondary | ICD-10-CM

## 2014-01-05 ENCOUNTER — Other Ambulatory Visit: Payer: Self-pay | Admitting: *Deleted

## 2014-01-05 DIAGNOSIS — M542 Cervicalgia: Secondary | ICD-10-CM

## 2014-01-05 DIAGNOSIS — Z853 Personal history of malignant neoplasm of breast: Secondary | ICD-10-CM

## 2014-01-05 DIAGNOSIS — M2550 Pain in unspecified joint: Secondary | ICD-10-CM

## 2014-01-06 ENCOUNTER — Telehealth: Payer: Self-pay | Admitting: Oncology

## 2014-01-06 ENCOUNTER — Other Ambulatory Visit (HOSPITAL_BASED_OUTPATIENT_CLINIC_OR_DEPARTMENT_OTHER): Payer: BC Managed Care – PPO

## 2014-01-06 ENCOUNTER — Other Ambulatory Visit: Payer: BC Managed Care – PPO

## 2014-01-06 ENCOUNTER — Ambulatory Visit (HOSPITAL_BASED_OUTPATIENT_CLINIC_OR_DEPARTMENT_OTHER): Payer: BC Managed Care – PPO | Admitting: Oncology

## 2014-01-06 ENCOUNTER — Ambulatory Visit (HOSPITAL_BASED_OUTPATIENT_CLINIC_OR_DEPARTMENT_OTHER): Payer: BC Managed Care – PPO

## 2014-01-06 VITALS — BP 125/74 | HR 79 | Temp 98.6°F | Resp 18 | Ht 60.0 in | Wt 111.6 lb

## 2014-01-06 DIAGNOSIS — M949 Disorder of cartilage, unspecified: Principal | ICD-10-CM

## 2014-01-06 DIAGNOSIS — Z853 Personal history of malignant neoplasm of breast: Secondary | ICD-10-CM

## 2014-01-06 DIAGNOSIS — C773 Secondary and unspecified malignant neoplasm of axilla and upper limb lymph nodes: Secondary | ICD-10-CM

## 2014-01-06 DIAGNOSIS — I890 Lymphedema, not elsewhere classified: Secondary | ICD-10-CM

## 2014-01-06 DIAGNOSIS — I10 Essential (primary) hypertension: Secondary | ICD-10-CM

## 2014-01-06 DIAGNOSIS — M899 Disorder of bone, unspecified: Secondary | ICD-10-CM

## 2014-01-06 DIAGNOSIS — M2550 Pain in unspecified joint: Secondary | ICD-10-CM

## 2014-01-06 DIAGNOSIS — M542 Cervicalgia: Secondary | ICD-10-CM

## 2014-01-06 LAB — BASIC METABOLIC PANEL (CC13)
ANION GAP: 8 meq/L (ref 3–11)
BUN: 17.4 mg/dL (ref 7.0–26.0)
CHLORIDE: 101 meq/L (ref 98–109)
CO2: 28 mEq/L (ref 22–29)
Calcium: 9.9 mg/dL (ref 8.4–10.4)
Creatinine: 1 mg/dL (ref 0.6–1.1)
Glucose: 94 mg/dl (ref 70–140)
POTASSIUM: 4.4 meq/L (ref 3.5–5.1)
SODIUM: 137 meq/L (ref 136–145)

## 2014-01-06 MED ORDER — SODIUM CHLORIDE 0.9 % IV SOLN
Freq: Once | INTRAVENOUS | Status: AC
  Administered 2014-01-06: 17:00:00 via INTRAVENOUS

## 2014-01-06 MED ORDER — ZOLEDRONIC ACID 4 MG/100ML IV SOLN
4.0000 mg | Freq: Once | INTRAVENOUS | Status: AC
  Administered 2014-01-06: 4 mg via INTRAVENOUS
  Filled 2014-01-06: qty 100

## 2014-01-06 NOTE — Progress Notes (Signed)
  [name]   Diagnosis: Breast Cancer  INTERVAL HISTORY:   Ms. Captain returns as scheduled. She continues Arimidex. Stable left arm lymphedema. No change over the chest wall. No complaint. No hot flashes or arthralgias. No jaw pain. A bilateral mammogram 09/29/2013 was negative. Objective:  Vital signs in last 24 hours:  Blood pressure 125/74, pulse 79, temperature 98.6 F (37 C), temperature source Oral, resp. rate 18, height 5' (1.524 m), weight 111 lb 9.6 oz (50.621 kg), SpO2 97.00%.    HEENT: Neck without mass Lymphatics: No cervical, supraclavicular, axillary, or inguinal nodes Resp: Lungs clear bilaterally Cardio: Regular rate and rhythm GI: No hepatomegaly Vascular: No leg edema, lymphedema sleeve in place at the left arm and hand Breasts: Status post left lumpectomy. Firm tissue surrounding the lumpectomy scar. No mass in either breast.       Medications: I have reviewed the patient's current medications.  Assessment/Plan: 1. Stage I (T1 NX) left-sided breast cancer diagnosed in April 1997. She was treated with a lumpectomy, left breast radiation, CMF chemotherapy, and 5 years of tamoxifen. 2. Metastatic carcinoma involving a left axillary lymph node, ER-positive, PR-positive, and HER-2-negative. She is status post a biopsy of a left axillary lymph node 08/13/2010. Status post a left axillary lymph node dissection 09/13/2010 with the pathology confirming 3/11 axillary lymph nodes containing metastatic carcinoma. A staging PET scan on 08/23/2010 confirmed a hypermetabolic left axillary lymph node and no other evidence of metastatic disease.  a. Status post 4 cycles of "adjuvant" T-C chemotherapy with cycle #4 given on 01/07/2011. b. She began Arimidex on 03/26/2011 3. Left arm lymphedema, likely lymphedema related to the left axillary lymph node dissection. She is now followed at the lymphedema clinic. Improved with physical therapy  and a lymphedema sleeve. 4. Pain and numbness at the fingers bilaterally-right greater than left, she reports being diagnosed with carpal tunnel syndrome and underwent a right sided carpal tunnel surgery with clinical improvement  5. Osteopenia-she received Zometa in July of 2014-no tooth/jaw symptoms  6. Hypertension   Disposition:  Shirley Melendez remains in clinical remission from breast cancer. She will continue Arimidex. She will receive Zometa for treatment of osteopenia today. She will schedule a followup bone density scan via Dr. Linna Darner. [name] will return for an office visit in 6 months.  Betsy Coder, MD  01/06/2014  4:10 PM

## 2014-01-06 NOTE — Patient Instructions (Signed)
Zoledronic Acid injection (Hypercalcemia, Oncology) What is this medicine? ZOLEDRONIC ACID (ZOE le dron ik AS id) lowers the amount of calcium loss from bone. It is used to treat too much calcium in your blood from cancer. It is also used to prevent complications of cancer that has spread to the bone. This medicine may be used for other purposes; ask your health care provider or pharmacist if you have questions. What should I tell my health care provider before I take this medicine? They need to know if you have any of these conditions: -aspirin-sensitive asthma -dental disease -kidney disease -an unusual or allergic reaction to zoledronic acid, other medicines, foods, dyes, or preservatives -pregnant or trying to get pregnant -breast-feeding How should I use this medicine? This medicine is for infusion into a vein. It is given by a health care professional in a hospital or clinic setting. Talk to your pediatrician regarding the use of this medicine in children. Special care may be needed. Overdosage: If you think you have taken too much of this medicine contact a poison control center or emergency room at once. NOTE: This medicine is only for you. Do not share this medicine with others. What if I miss a dose? It is important not to miss your dose. Call your doctor or health care professional if you are unable to keep an appointment. What may interact with this medicine? -certain antibiotics given by injection -NSAIDs, medicines for pain and inflammation, like ibuprofen or naproxen -some diuretics like bumetanide, furosemide -teriparatide -thalidomide This list may not describe all possible interactions. Give your health care provider a list of all the medicines, herbs, non-prescription drugs, or dietary supplements you use. Also tell them if you smoke, drink alcohol, or use illegal drugs. Some items may interact with your medicine. What should I watch for while using this medicine? Visit  your doctor or health care professional for regular checkups. It may be some time before you see the benefit from this medicine. Do not stop taking your medicine unless your doctor tells you to. Your doctor may order blood tests or other tests to see how you are doing. Women should inform their doctor if they wish to become pregnant or think they might be pregnant. There is a potential for serious side effects to an unborn child. Talk to your health care professional or pharmacist for more information. You should make sure that you get enough calcium and vitamin D while you are taking this medicine. Discuss the foods you eat and the vitamins you take with your health care professional. Some people who take this medicine have severe bone, joint, and/or muscle pain. This medicine may also increase your risk for a broken thigh bone. Tell your doctor right away if you have pain in your upper leg or groin. Tell your doctor if you have any pain that does not go away or that gets worse. What side effects may I notice from receiving this medicine? Side effects that you should report to your doctor or health care professional as soon as possible: -allergic reactions like skin rash, itching or hives, swelling of the face, lips, or tongue -anxiety, confusion, or depression -breathing problems -changes in vision -feeling faint or lightheaded, falls -jaw burning, cramping, pain -muscle cramps, stiffness, or weakness -trouble passing urine or change in the amount of urine Side effects that usually do not require medical attention (report to your doctor or health care professional if they continue or are bothersome): -bone, joint, or muscle pain -  fever -hair loss -irritation at site where injected -loss of appetite -nausea, vomiting -stomach upset -tired This list may not describe all possible side effects. Call your doctor for medical advice about side effects. You may report side effects to FDA at  1-800-FDA-1088. Where should I keep my medicine? This drug is given in a hospital or clinic and will not be stored at home. NOTE: This sheet is a summary. It may not cover all possible information. If you have questions about this medicine, talk to your doctor, pharmacist, or health care provider.  2013, [employer]vier/Gold Standard. (11/30/2010 9:06:58 AM)

## 2014-01-06 NOTE — Telephone Encounter (Signed)
gv adn rpitned aptp sched and avs for pt for Jan 2016

## 2014-01-06 NOTE — Telephone Encounter (Signed)
gv adn rpinted appts ched and avs for pt fro Jan 2016

## 2014-02-02 ENCOUNTER — Other Ambulatory Visit: Payer: Self-pay | Admitting: Oncology

## 2014-02-02 DIAGNOSIS — Z853 Personal history of malignant neoplasm of breast: Secondary | ICD-10-CM

## 2014-02-28 ENCOUNTER — Other Ambulatory Visit: Payer: Self-pay | Admitting: Internal Medicine

## 2014-03-28 ENCOUNTER — Other Ambulatory Visit: Payer: Self-pay | Admitting: Internal Medicine

## 2014-07-07 ENCOUNTER — Telehealth: Payer: Self-pay | Admitting: Oncology

## 2014-07-07 ENCOUNTER — Ambulatory Visit (HOSPITAL_BASED_OUTPATIENT_CLINIC_OR_DEPARTMENT_OTHER): Payer: BC Managed Care – PPO | Admitting: Oncology

## 2014-07-07 VITALS — BP 133/73 | HR 81 | Temp 98.2°F | Resp 18 | Ht 60.0 in | Wt 114.3 lb

## 2014-07-07 DIAGNOSIS — C773 Secondary and unspecified malignant neoplasm of axilla and upper limb lymph nodes: Secondary | ICD-10-CM

## 2014-07-07 DIAGNOSIS — I890 Lymphedema, not elsewhere classified: Secondary | ICD-10-CM

## 2014-07-07 DIAGNOSIS — Z853 Personal history of malignant neoplasm of breast: Secondary | ICD-10-CM

## 2014-07-07 DIAGNOSIS — I10 Essential (primary) hypertension: Secondary | ICD-10-CM

## 2014-07-07 DIAGNOSIS — M949 Disorder of cartilage, unspecified: Principal | ICD-10-CM

## 2014-07-07 DIAGNOSIS — M899 Disorder of bone, unspecified: Secondary | ICD-10-CM

## 2014-07-07 DIAGNOSIS — M8580 Other specified disorders of bone density and structure, unspecified site: Secondary | ICD-10-CM

## 2014-07-07 NOTE — Progress Notes (Signed)
  [place] OFFICE PROGRESS NOTE   Diagnosis: Breast cancer  INTERVAL HISTORY:   She returns as scheduled. She continues Arimidex. No hot flashes or arthralgias. Stable left arm lymphedema, improved with a lymphedema sleeve.  Objective:  Vital signs in last 24 hours:  Blood pressure 133/73, pulse 81, temperature 98.2 F (36.8 C), temperature source Oral, resp. rate 18, height 5' (1.524 m), weight 114 lb 4.8 oz (51.846 kg), SpO2 99 %.    HEENT: Neck without mass Lymphatics: No cervical, supraclavicular, or axillary nodes Resp: Lungs clear bilaterally Cardio: Regular rate and rhythm GI: No hepatomegaly Vascular: No leg edema. Lymphedema sleeve in place over the left arm. Breasts: Right breast without mass. Status post left lumpectomy. No evidence for local tumor recurrence. No mass in either breast.  Medications: I have reviewed the patient's current medications.  Assessment/Plan: 1. Stage I (T1 NX) left-sided breast cancer diagnosed in April 1997. She was treated with a lumpectomy, left breast radiation, CMF chemotherapy, and 5 years of tamoxifen. 2. Metastatic carcinoma involving a left axillary lymph node, ER-positive, PR-positive, and HER-2-negative. She is status post a biopsy of a left axillary lymph node 08/13/2010. Status post a left axillary lymph node dissection 09/13/2010 with the pathology confirming 3/11 axillary lymph nodes containing metastatic carcinoma. A staging PET scan on 08/23/2010 confirmed a hypermetabolic left axillary lymph node and no other evidence of metastatic disease.  1. Status post 4 cycles of "adjuvant" T-C chemotherapy with cycle #4 given on 01/07/2011. 2. She began Arimidex on 03/26/2011 3. Left arm lymphedema, likely lymphedema related to the left axillary lymph node dissection. She is now followed at the lymphedema clinic. Improved with physical therapy and a lymphedema sleeve. 4. Pain and numbness at the fingers bilaterally-right  greater than left, she reports being diagnosed with carpal tunnel syndrome and underwent a right sided carpal tunnel surgery with clinical improvement  5. Osteopenia-she received Zometa in July of 2015-no tooth/jaw symptoms  6. Hypertension  Disposition:   she remains in clinical remission from breast cancer. She will schedule a mammogram and bone density scan for April 2016. Ms. Simerson will return for an office visit and Zometa in 6 months. She will continue Arimidex.  Betsy Coder, MD  07/07/2014  3:55 PM

## 2014-07-07 NOTE — Telephone Encounter (Signed)
Gave avs & calendar for July. Sent message to schedule treatment.

## 2014-07-08 ENCOUNTER — Telehealth: Payer: Self-pay | Admitting: *Deleted

## 2014-07-08 NOTE — Telephone Encounter (Signed)
Per staff message and POF I have scheduled appts. Advised scheduler of appts. JMW  

## 2014-08-02 ENCOUNTER — Other Ambulatory Visit: Payer: Self-pay | Admitting: *Deleted

## 2014-08-02 ENCOUNTER — Other Ambulatory Visit: Payer: Self-pay | Admitting: Oncology

## 2014-08-02 DIAGNOSIS — Z853 Personal history of malignant neoplasm of breast: Secondary | ICD-10-CM

## 2014-08-02 MED ORDER — ANASTROZOLE 1 MG PO TABS: 1.0000 mg | ORAL_TABLET | Freq: Every day | ORAL | Status: DC

## 2014-08-26 ENCOUNTER — Other Ambulatory Visit: Payer: Self-pay | Admitting: Internal Medicine

## 2014-09-26 ENCOUNTER — Other Ambulatory Visit: Payer: Self-pay | Admitting: Obstetrics and Gynecology

## 2014-09-26 DIAGNOSIS — Z853 Personal history of malignant neoplasm of breast: Secondary | ICD-10-CM

## 2014-10-05 ENCOUNTER — Ambulatory Visit
Admission: RE | Admit: 2014-10-05 | Discharge: 2014-10-05 | Disposition: A | Discharge status: Home / Self Care | Payer: Medicare Other | Source: Ambulatory Visit | Attending: Obstetrics and Gynecology | Admitting: Obstetrics and Gynecology

## 2014-10-05 DIAGNOSIS — Z853 Personal history of malignant neoplasm of breast: Secondary | ICD-10-CM

## 2014-10-14 ENCOUNTER — Encounter: Payer: BC Managed Care – PPO | Admitting: Internal Medicine

## 2014-10-20 ENCOUNTER — Other Ambulatory Visit (INDEPENDENT_AMBULATORY_CARE_PROVIDER_SITE_OTHER): Payer: Medicare Other

## 2014-10-20 ENCOUNTER — Encounter: Payer: Self-pay | Admitting: Internal Medicine

## 2014-10-20 ENCOUNTER — Ambulatory Visit (INDEPENDENT_AMBULATORY_CARE_PROVIDER_SITE_OTHER): Payer: Medicare Other | Admitting: Internal Medicine

## 2014-10-20 VITALS — BP 118/78 | HR 79 | Temp 97.7°F | Resp 13 | Ht 60.0 in | Wt 114.5 lb

## 2014-10-20 DIAGNOSIS — I10 Essential (primary) hypertension: Secondary | ICD-10-CM

## 2014-10-20 DIAGNOSIS — Z853 Personal history of malignant neoplasm of breast: Secondary | ICD-10-CM

## 2014-10-20 DIAGNOSIS — E785 Hyperlipidemia, unspecified: Secondary | ICD-10-CM | POA: Diagnosis not present

## 2014-10-20 DIAGNOSIS — M8580 Other specified disorders of bone density and structure, unspecified site: Secondary | ICD-10-CM

## 2014-10-20 DIAGNOSIS — Z1211 Encounter for screening for malignant neoplasm of colon: Secondary | ICD-10-CM

## 2014-10-20 DIAGNOSIS — Z0000 Encounter for general adult medical examination without abnormal findings: Secondary | ICD-10-CM

## 2014-10-20 LAB — LIPID PANEL
CHOL/HDL RATIO: 3
CHOLESTEROL: 236 mg/dL — AB (ref 0–200)
HDL: 81.1 mg/dL (ref >39.00)
LDL CALC: 130 mg/dL — AB (ref 0–99)
NonHDL: 154.9
Triglycerides: 126 mg/dL (ref 0.0–149.0)
VLDL: 25.2 mg/dL (ref 0.0–40.0)

## 2014-10-20 LAB — CBC WITH DIFFERENTIAL/PLATELET
BASOS ABS: 0 10*3/uL (ref 0.0–0.1)
Basophils Relative: 0.3 % (ref 0.0–3.0)
EOS ABS: 0.1 10*3/uL (ref 0.0–0.7)
Eosinophils Relative: 1.7 % (ref 0.0–5.0)
HCT: 41.6 % (ref 36.0–46.0)
Hemoglobin: 14.7 g/dL (ref 12.0–15.0)
LYMPHS ABS: 1.6 10*3/uL (ref 0.7–4.0)
LYMPHS PCT: 20.2 % (ref 12.0–46.0)
MCHC: 35.3 g/dL (ref 30.0–36.0)
MCV: 90.1 fl (ref 78.0–100.0)
MONO ABS: 0.7 10*3/uL (ref 0.1–1.0)
MONOS PCT: 9.1 % (ref 3.0–12.0)
Neutro Abs: 5.5 10*3/uL (ref 1.4–7.7)
Neutrophils Relative %: 68.7 % (ref 43.0–77.0)
Platelets: 281 10*3/uL (ref 150.0–400.0)
RBC: 4.62 Mil/uL (ref 3.87–5.11)
RDW: 13.3 % (ref 11.5–15.5)
WBC: 7.9 10*3/uL (ref 4.0–10.5)

## 2014-10-20 LAB — HEPATIC FUNCTION PANEL
ALK PHOS: 59 U/L (ref 39–117)
ALT: 18 U/L (ref 0–35)
AST: 17 U/L (ref 0–37)
Albumin: 4.3 g/dL (ref 3.5–5.2)
BILIRUBIN DIRECT: 0.1 mg/dL (ref 0.0–0.3)
TOTAL PROTEIN: 7.1 g/dL (ref 6.0–8.3)
Total Bilirubin: 0.6 mg/dL (ref 0.2–1.2)

## 2014-10-20 LAB — BASIC METABOLIC PANEL
BUN: 18 mg/dL (ref 6–23)
CO2: 32 meq/L (ref 19–32)
CREATININE: 1 mg/dL (ref 0.40–1.20)
Calcium: 10.1 mg/dL (ref 8.4–10.5)
Chloride: 99 mEq/L (ref 96–112)
GFR: 59.13 mL/min — ABNORMAL LOW (ref >60.00)
Glucose, Bld: 108 mg/dL — ABNORMAL HIGH (ref 70–99)
Potassium: 4.3 mEq/L (ref 3.5–5.1)
Sodium: 135 mEq/L (ref 135–145)

## 2014-10-20 LAB — TSH: TSH: 1.66 u[IU]/mL (ref 0.35–4.50)

## 2014-10-20 LAB — VITAMIN D 25 HYDROXY (VIT D DEFICIENCY, FRACTURES): VITD: 55.57 ng/mL (ref 30.00–100.00)

## 2014-10-20 MED ORDER — SPIRONOLACTONE 25 MG PO TABS: ORAL_TABLET | ORAL | Status: DC

## 2014-10-20 MED ORDER — METOPROLOL TARTRATE 50 MG PO TABS: ORAL_TABLET | ORAL | Status: DC

## 2014-10-20 NOTE — Patient Instructions (Addendum)
  Your next office appointment will be determined based upon review of your pending labs . Those instructions will be transmitted to you by My Chart Critical results will be called. Followup as needed for any active or acute issue. Please report any significant change in your symptoms.  Minimal Blood Pressure Goal= AVERAGE < 140/90;  Ideal is an AVERAGE < 135/85. This AVERAGE should be calculated from @ least 5-7 BP readings taken @ different times of day on different days of week. You should not respond to isolated BP readings , but rather the AVERAGE for that week .Please bring your  blood pressure cuff to office visits to verify that it is reliable.It  can also be checked against the blood pressure device at the pharmacy. Finger or wrist cuffs are not dependable; an arm cuff is.  Preventive Health Care: Exercise  30-45  minutes a day, 3-4 days a week. Walking is especially valuable in preventing Osteoporosis progression. Eat a low-fat diet with lots of fruits and vegetables, up to 7-9 servings per day. This would eliminate need for vitamin supplements for most individuals. Consume less than 30 grams of sugar per day from foods & drinks with High Fructose Corn Syrup as #2,3 or #4 on label. Seatbelts can save your life. Wear them always. Smoke detectors on every level of your home, check batteries every year. Eye Doctor - have an eye exam @ least annually The legal document "[place] Will " verifies decisions concerning your health care.Please reassess the current staus of these documents. Depression is common in our stressful world. If you're feeling down or losing interest in things you normally enjoy, please be seen. Colonoscopy will be scheduled.

## 2014-10-20 NOTE — Assessment & Plan Note (Signed)
Lipids, LFTs, TSH  

## 2014-10-20 NOTE — Progress Notes (Signed)
Pre visit review using our clinic review tool, if applicable. No additional management support is needed unless otherwise documented below in the visit note. 

## 2014-10-20 NOTE — Assessment & Plan Note (Addendum)
BMD Vit D level

## 2014-10-20 NOTE — Assessment & Plan Note (Signed)
Blood pressure goals reviewed. BMET 

## 2014-10-20 NOTE — Progress Notes (Signed)
Subjective:    Patient ID: Shirley Melendez, female    DOB: 08-Jan-1951, 64 y.o.   MRN: [id number]  HPI Initial Medicare Wellness Visit: Psychosocial and medical history were reviewed as required by Medicare (history related to abuse, antisocial behavior , firearm risk). Social history: Caffeine: 2-3 cups coffee/ day;iced tea daily Alcohol:no Tobacco use:no Exercise:walking 2-3 x/ week for 20 min w/o symptoms Personal safety/fall risk: no Limitations of activities of daily living:no Seatbelt/ smoke alarm use:yes Healthcare Power of Attorney/Living Will status and End of Life process assessment : only Living Will;indication discussed Ophthalmologic exam status:UTD Hearing evaluation status:not UTD Orientation: Oriented X 3 Memory and recall: good Spelling testing: good Depression/anxiety assessment: no Foreign travel history: 70 [employer] status for influenza/pneumonia/ shingles /tetanus:Shingles (if OK with Dr Perry Mount) & PNA needed  Transfusion history: no Preventive health care maintenance status: Colonoscopy/BMD/mammogram/Pap as per protocol/standard care:BMD overdue;colonoscopy 2005 Dental care: every 4 mos Chart reviewed and updated. Active issues reviewed and addressed as documented below.    She is on a heart healthy, low-salt diet. She's been compliant with her medications without adverse effects.   BP averages 110/70s @ home     She has had no colonoscopy since 2005. She has no active GI symptoms.   She is on Zometa once a year from her oncologist. She's not had a bone density since 2013.    Review of Systems Chest pain, palpitations, tachycardia, exertional dyspnea, paroxysmal nocturnal dyspnea, claudication or edema are absent.  Unexplained weight loss, abdominal pain, significant dyspepsia, dysphagia, melena, rectal bleeding, or persistently small caliber stools are denied.    Objective:   Physical Exam Gen.: Thin but adequately nourished in  appearance. Alert, appropriate and cooperative throughout exam. BMI: Appears younger than stated age  Head: Normocephalic without obvious abnormalities  Eyes: No corneal or conjunctival inflammation noted. Pupils equal round reactive to light and accommodation. Extraocular motion intact.  Ears: External  ear exam reveals no significant lesions or deformities. Canals clear .TMs normal. Hearing is grossly decreased bilaterally,R>L. Nose: External nasal exam reveals no deformity or inflammation. Nasal mucosa are pink and moist. No lesions or exudates noted.   Mouth: Oral mucosa and oropharynx reveal no lesions or exudates. Teeth in good repair. Neck: No deformities, masses, or tenderness noted. Range of motion & Thyroid normal Lungs: Normal respiratory effort; chest expands symmetrically. Lungs are clear to auscultation without rales, wheezes, or increased work of breathing. Heart: Normal rate and rhythm. Normal S1 and S2. No gallop, click, or rub. S4 w/o murmur. Abdomen: Bowel sounds normal; abdomen soft and nontender. No masses, organomegaly or hernias noted. Genitalia: as per Gyn                                  Musculoskeletal/extremities: No deformity or scoliosis noted of  the thoracic or lumbar spine.  No clubbing, cyanosis, edema, or significant extremity  deformity noted.  Range of motion normal . Tone & strength normal. Hand joints reveal mild  DJD DIP changes.  Fingernail  health good. Compression device LUE  Able to lie down & sit up w/o help.  Negative SLR bilaterally Vascular: Carotid, radial artery, dorsalis pedis and  posterior tibial pulses are equal. Decreased DPP.No bruits present. Neurologic: Alert and oriented x3. Deep tendon reflexes symmetrical and normal.  Gait normal      Skin: Intact without suspicious lesions or rashes. Lymph: No cervical, axillary lymphadenopathy present.  Psych: Mood and affect are normal. Normally interactive                                                                                       Assessment & Plan:  See Current Assessment & Plan in Problem List under specific DiagnosisThe labs will be reviewed and risks and options assessed. Written recommendations will be provided by mail or directly through My Chart.Further evaluation or change in medical therapy will be directed by those results.

## 2014-10-21 ENCOUNTER — Encounter: Payer: Self-pay | Admitting: Gastroenterology

## 2014-10-24 ENCOUNTER — Other Ambulatory Visit (INDEPENDENT_AMBULATORY_CARE_PROVIDER_SITE_OTHER): Payer: Medicare Other

## 2014-10-24 ENCOUNTER — Telehealth: Payer: Self-pay

## 2014-10-24 DIAGNOSIS — R739 Hyperglycemia, unspecified: Secondary | ICD-10-CM

## 2014-10-24 LAB — HEMOGLOBIN A1C: HEMOGLOBIN A1C: 5.7 % (ref 4.6–6.5)

## 2014-10-24 NOTE — Telephone Encounter (Signed)
Request for add on has been sent to lab

## 2014-10-24 NOTE — Telephone Encounter (Signed)
-----   Message from Hendricks Limes, MD sent at 10/23/2014 10:04 AM EDT ----- Please add A1c (R73.9)

## 2014-10-25 ENCOUNTER — Encounter: Payer: Medicare Other | Admitting: Internal Medicine

## 2014-11-08 ENCOUNTER — Ambulatory Visit (INDEPENDENT_AMBULATORY_CARE_PROVIDER_SITE_OTHER)
Admission: RE | Admit: 2014-11-08 | Discharge: 2014-11-08 | Disposition: A | Discharge status: Home / Self Care | Payer: Medicare Other | Source: Ambulatory Visit | Attending: Internal Medicine | Admitting: Internal Medicine

## 2014-11-08 DIAGNOSIS — M8580 Other specified disorders of bone density and structure, unspecified site: Secondary | ICD-10-CM | POA: Diagnosis not present

## 2014-11-11 ENCOUNTER — Ambulatory Visit (AMBULATORY_SURGERY_CENTER): Payer: Self-pay | Admitting: *Deleted

## 2014-11-11 VITALS — Ht 60.0 in | Wt 112.8 lb

## 2014-11-11 DIAGNOSIS — Z1211 Encounter for screening for malignant neoplasm of colon: Secondary | ICD-10-CM

## 2014-11-11 NOTE — Progress Notes (Signed)
No allergies to eggs or soy. No problems with anesthesia.  Pt given Emmi instructions for colonoscopy  No oxygen use  No diet drug use  

## 2014-11-24 ENCOUNTER — Telehealth: Payer: Self-pay | Admitting: Oncology

## 2014-11-24 NOTE — Telephone Encounter (Signed)
S/w pt confirming labs/ov r/s due to provider schedule, also sent msg to r/s Zometa and mailed out schedule... KJ

## 2014-11-25 ENCOUNTER — Ambulatory Visit (AMBULATORY_SURGERY_CENTER): Payer: Medicare Other | Admitting: Gastroenterology

## 2014-11-25 ENCOUNTER — Encounter: Payer: Self-pay | Admitting: Gastroenterology

## 2014-11-25 VITALS — BP 143/77 | HR 81 | Temp 99.1°F | Resp 16 | Ht 60.0 in | Wt 112.0 lb

## 2014-11-25 DIAGNOSIS — Z1211 Encounter for screening for malignant neoplasm of colon: Secondary | ICD-10-CM

## 2014-11-25 DIAGNOSIS — K5730 Diverticulosis of large intestine without perforation or abscess without bleeding: Secondary | ICD-10-CM

## 2014-11-25 MED ORDER — SODIUM CHLORIDE 0.9 % IV SOLN: 500.0000 mL | INTRAVENOUS | Status: DC

## 2014-11-25 NOTE — Patient Instructions (Addendum)
.YOU HAD AN ENDOSCOPIC PROCEDURE TODAY AT [facility]:   Refer to the procedure report that was given to you for any specific questions about what was found during the examination.  If the procedure report does not answer your questions, please call your gastroenterologist to clarify.  If you requested that your care partner not be given the details of your procedure findings, then the procedure report has been included in a sealed envelope for you to review at your convenience later.  YOU SHOULD EXPECT: Some feelings of bloating in the abdomen. Passage of more gas than usual.  Walking can help get rid of the air that was put into your GI tract during the procedure and reduce the bloating. If you had a lower endoscopy (such as a colonoscopy or flexible sigmoidoscopy) you may notice spotting of blood in your stool or on the toilet paper. If you underwent a bowel prep for your procedure, you may not have a normal bowel movement for a few days.  Please Note:  You might notice some irritation and congestion in your nose or some drainage.  This is from the oxygen used during your procedure.  There is no need for concern and it should clear up in a day or so.  SYMPTOMS TO REPORT IMMEDIATELY:   Following lower endoscopy (colonoscopy or flexible sigmoidoscopy):  Excessive amounts of blood in the stool  Significant tenderness or worsening of abdominal pains  Swelling of the abdomen that is new, acute  Fever of 100F or higher   Following upper endoscopy (EGD)  Vomiting of blood or coffee ground material  New chest pain or pain under the shoulder blades  Painful or persistently difficult swallowing  New shortness of breath  Fever of 100F or higher  Black, tarry-looking stools  For urgent or emergent issues, a gastroenterologist can be reached at any hour by calling [phone].   DIET: Your first meal following the procedure should be a small meal and then it is ok to progress  to your normal diet. Heavy or fried foods are harder to digest and may make you feel nauseous or bloated.  Likewise, meals heavy in dairy and vegetables can increase bloating.  Drink plenty of fluids but you should avoid alcoholic beverages for 24 hours.  ACTIVITY:  You should plan to take it easy for the rest of today and you should NOT DRIVE or use heavy machinery until tomorrow (because of the sedation medicines used during the test).    FOLLOW UP: Our staff will call the number listed on your records the next business day following your procedure to check on you and address any questions or concerns that you may have regarding the information given to you following your procedure. If we do not reach you, we will leave a message.  However, if you are feeling well and you are not experiencing any problems, there is no need to return our call.  We will assume that you have returned to your regular daily activities without incident.  If any biopsies were taken you will be contacted by phone or by letter within the next 1-3 weeks.  Please call us at [phone] if you have not heard about the biopsies in 3 weeks.    SIGNATURES/CONFIDENTIALITY: You and/or your care partner have signed paperwork which will be entered into your electronic medical record.  These signatures attest to the fact that that the information above on your After Visit Summary has been reviewed  and is understood.  Full responsibility of the confidentiality of this discharge information lies with you and/or your care-partner.    INFORMATION ON DIVERTICULOSIS AND HIGH FIBER DIET GIVEN TO YOU TODAY

## 2014-11-25 NOTE — Progress Notes (Signed)
Report to PACU, RN, vss, BBS= Clear.  

## 2014-11-25 NOTE — Op Note (Signed)
[place]  [address] [place], [address]   COLONOSCOPY PROCEDURE REPORT  PATIENT: Shirley, Melendez  MR#: [id number] BIRTHDATE: [birth year] , 64  yrs. old GENDER: female ENDOSCOPIST: Milus Banister, MD REFERRED OL:[id number] Linna Darner, M.D. PROCEDURE DATE:  11/25/2014 PROCEDURE:   Colonoscopy, screening First Screening Colonoscopy - Avg.  risk and is 50 yrs.  old or older - No.  Prior Negative Screening - Now for repeat screening. 10 or more years since last screening  History of Adenoma - Now for follow-up colonoscopy & has been > or = to 3 yrs.  N/A  Recommend repeat exam, <10 yrs? No ASA CLASS:   Class II INDICATIONS:Screening for colonic neoplasia and Colorectal Neoplasm Risk Assessment for this procedure is average risk. MEDICATIONS: Monitored anesthesia care and Propofol 200 mg IV  DESCRIPTION OF PROCEDURE:   After the risks benefits and alternatives of the procedure were thoroughly explained, informed consent was obtained.  The digital rectal exam revealed no abnormalities of the rectum.   The LB [id number]  endoscope was introduced through the anus and advanced to the cecum, which was identified by both the appendix and ileocecal valve. No adverse events experienced.   The quality of the prep was excellent.  The instrument was then slowly withdrawn as the colon was fully examined. Estimated blood loss is zero unless otherwise noted in this procedure report.   COLON FINDINGS: There was mild diverticulosis noted in the left colon.   The examination was otherwise normal.  Retroflexed views revealed no abnormalities. The time to cecum = 3.9 Withdrawal time = 9.1   The scope was withdrawn and the procedure completed. COMPLICATIONS: There were no immediate complications.  ENDOSCOPIC IMPRESSION: 1.   Mild diverticulosis was noted in the left colon 2.   The examination was otherwise normal; no polyps or cancers  RECOMMENDATIONS: You should continue to  follow colorectal cancer screening guidelines for "routine risk" patients with a repeat colonoscopy in 10 years.   eSigned:  Milus Banister, MD 11/25/2014 2:59 PM

## 2014-11-28 ENCOUNTER — Telehealth: Payer: Self-pay | Admitting: *Deleted

## 2014-11-28 NOTE — Telephone Encounter (Signed)
  No flowsheet data found.   Called home number, did not leave message, call back was not assessed in admitting to leave message, name identifier, follow-up

## 2014-12-03 ENCOUNTER — Other Ambulatory Visit: Payer: Self-pay | Admitting: Oncology

## 2015-01-05 ENCOUNTER — Other Ambulatory Visit (HOSPITAL_BASED_OUTPATIENT_CLINIC_OR_DEPARTMENT_OTHER): Payer: Medicare Other

## 2015-01-05 ENCOUNTER — Ambulatory Visit: Payer: BC Managed Care – PPO | Admitting: Oncology

## 2015-01-05 ENCOUNTER — Ambulatory Visit (HOSPITAL_BASED_OUTPATIENT_CLINIC_OR_DEPARTMENT_OTHER): Payer: Medicare Other | Admitting: Oncology

## 2015-01-05 ENCOUNTER — Ambulatory Visit: Payer: BC Managed Care – PPO

## 2015-01-05 ENCOUNTER — Ambulatory Visit (HOSPITAL_BASED_OUTPATIENT_CLINIC_OR_DEPARTMENT_OTHER): Payer: Medicare Other

## 2015-01-05 ENCOUNTER — Telehealth: Payer: Self-pay | Admitting: Oncology

## 2015-01-05 ENCOUNTER — Other Ambulatory Visit: Payer: BC Managed Care – PPO

## 2015-01-05 VITALS — BP 124/82 | HR 88 | Temp 99.2°F | Resp 17 | Ht 60.0 in | Wt 112.6 lb

## 2015-01-05 DIAGNOSIS — M8580 Other specified disorders of bone density and structure, unspecified site: Secondary | ICD-10-CM | POA: Diagnosis not present

## 2015-01-05 DIAGNOSIS — Z853 Personal history of malignant neoplasm of breast: Secondary | ICD-10-CM

## 2015-01-05 DIAGNOSIS — M899 Disorder of bone, unspecified: Secondary | ICD-10-CM

## 2015-01-05 DIAGNOSIS — M949 Disorder of cartilage, unspecified: Principal | ICD-10-CM

## 2015-01-05 DIAGNOSIS — I10 Essential (primary) hypertension: Secondary | ICD-10-CM | POA: Diagnosis not present

## 2015-01-05 DIAGNOSIS — I890 Lymphedema, not elsewhere classified: Secondary | ICD-10-CM

## 2015-01-05 DIAGNOSIS — C773 Secondary and unspecified malignant neoplasm of axilla and upper limb lymph nodes: Secondary | ICD-10-CM

## 2015-01-05 LAB — BASIC METABOLIC PANEL (CC13)
ANION GAP: 7 meq/L (ref 3–11)
BUN: 17 mg/dL (ref 7.0–26.0)
CHLORIDE: 104 meq/L (ref 98–109)
CO2: 30 mEq/L — ABNORMAL HIGH (ref 22–29)
Calcium: 9.5 mg/dL (ref 8.4–10.4)
Creatinine: 1 mg/dL (ref 0.6–1.1)
EGFR: 58 mL/min/{1.73_m2} — ABNORMAL LOW (ref >90)
GLUCOSE: 75 mg/dL (ref 70–140)
POTASSIUM: 3.8 meq/L (ref 3.5–5.1)
SODIUM: 141 meq/L (ref 136–145)

## 2015-01-05 MED ORDER — SODIUM CHLORIDE 0.9 % IV SOLN
Freq: Once | INTRAVENOUS | Status: AC
  Administered 2015-01-05: 13:00:00 via INTRAVENOUS

## 2015-01-05 MED ORDER — ZOLEDRONIC ACID 4 MG/100ML IV SOLN
4.0000 mg | Freq: Once | INTRAVENOUS | Status: AC
  Administered 2015-01-05: 4 mg via INTRAVENOUS
  Filled 2015-01-05: qty 100

## 2015-01-05 NOTE — Progress Notes (Signed)
  [facility] OFFICE PROGRESS NOTE   Diagnosis: Breast cancer  INTERVAL HISTORY:   Ms. Husby returns as scheduled. She feels well. She continues to use a left arm lymphedema sleeve. She continues Arimidex. No change over either breast. A bilateral mammogram 10/05/2014 was negative. A bone density scan 11/08/2014 revealed increased bone density at the lumbar spine and femoral neck compared to a previous study. The femoral neck bone density returned in the osteopenia range bilaterally.   Objective:  Vital signs in last 24 hours:  Blood pressure 124/82, pulse 88, temperature 99.2 F (37.3 C), temperature source Oral, resp. rate 17, height 5' (1.524 m), weight 112 lb 9.6 oz (51.075 kg), SpO2 98 %.    HEENT: Neck without mass Lymphatics: No cervical, supra-clavicular, or axillary nodes Resp: Lungs clear bilaterally Cardio: Regular rate and rhythm GI: No hepatomegaly Vascular: No leg edema, lymphedema sleeve over the left arm and hand. No significant edema of the left hand  Breasts: Right breast without mass. Status post left lumpectomy. No evidence for local tumor recurrence. No mass in either breast.   Medications: I have reviewed the patient's current medications.  Assessment/Plan: 1. Stage I (T1 NX) left-sided breast cancer diagnosed in April 1997. She was treated with a lumpectomy, left breast radiation, CMF chemotherapy, and 5 years of tamoxifen. 2. Metastatic carcinoma involving a left axillary lymph node, ER-positive, PR-positive, and HER-2-negative. She is status post a biopsy of a left axillary lymph node 08/13/2010. Status post a left axillary lymph node dissection 09/13/2010 with the pathology confirming 3/11 axillary lymph nodes containing metastatic carcinoma. A staging PET scan on 08/23/2010 confirmed a hypermetabolic left axillary lymph node and no other evidence of metastatic disease.  1. Status post 4 cycles of "adjuvant" T-C chemotherapy with cycle #4  given on 01/07/2011. 2. She began Arimidex on 03/26/2011 3. Left arm lymphedema, likely lymphedema related to the left axillary lymph node dissection. She is now followed at the lymphedema clinic. Improved with physical therapy and a lymphedema sleeve. 4. Pain and numbness at the fingers bilaterally-right greater than left, she reports being diagnosed with carpal tunnel syndrome and underwent a right sided carpal tunnel surgery with clinical improvement  5. Osteopenia-she received Zometa in July of 2015-no tooth/jaw symptoms  6. Hypertension    Disposition:  Ms. Holck remains in clinical remission from breast cancer. She will continue Arimidex. She continues yearly Zometa for treatment of osteopenia. She reports no jaw toxicity from the Zometa.  Ms. Yetman will return for an office visit in 6 months.  Betsy Coder, MD  01/05/2015  11:10 AM

## 2015-01-05 NOTE — Telephone Encounter (Signed)
per pof to sch pt appt-gave pt copy of avs °

## 2015-01-05 NOTE — Patient Instructions (Signed)
Zoledronic Acid injection (Hypercalcemia, Oncology) What is this medicine? ZOLEDRONIC ACID (ZOE le dron ik AS id) lowers the amount of calcium loss from bone. It is used to treat too much calcium in your blood from cancer. It is also used to prevent complications of cancer that has spread to the bone. This medicine may be used for other purposes; ask your health care provider or pharmacist if you have questions. What should I tell my health care provider before I take this medicine? They need to know if you have any of these conditions: -aspirin-sensitive asthma -dental disease -kidney disease -an unusual or allergic reaction to zoledronic acid, other medicines, foods, dyes, or preservatives -pregnant or trying to get pregnant -breast-feeding How should I use this medicine? This medicine is for infusion into a vein. It is given by a health care professional in a hospital or clinic setting. Talk to your pediatrician regarding the use of this medicine in children. Special care may be needed. Overdosage: If you think you have taken too much of this medicine contact a poison control center or emergency room at once. NOTE: This medicine is only for you. Do not share this medicine with others. What if I miss a dose? It is important not to miss your dose. Call your doctor or health care professional if you are unable to keep an appointment. What may interact with this medicine? -certain antibiotics given by injection -NSAIDs, medicines for pain and inflammation, like ibuprofen or naproxen -some diuretics like bumetanide, furosemide -teriparatide -thalidomide This list may not describe all possible interactions. Give your health care provider a list of all the medicines, herbs, non-prescription drugs, or dietary supplements you use. Also tell them if you smoke, drink alcohol, or use illegal drugs. Some items may interact with your medicine. What should I watch for while using this medicine? Visit  your doctor or health care professional for regular checkups. It may be some time before you see the benefit from this medicine. Do not stop taking your medicine unless your doctor tells you to. Your doctor may order blood tests or other tests to see how you are doing. Women should inform their doctor if they wish to become pregnant or think they might be pregnant. There is a potential for serious side effects to an unborn child. Talk to your health care professional or pharmacist for more information. You should make sure that you get enough calcium and vitamin D while you are taking this medicine. Discuss the foods you eat and the vitamins you take with your health care professional. Some people who take this medicine have severe bone, joint, and/or muscle pain. This medicine may also increase your risk for a broken thigh bone. Tell your doctor right away if you have pain in your upper leg or groin. Tell your doctor if you have any pain that does not go away or that gets worse. What side effects may I notice from receiving this medicine? Side effects that you should report to your doctor or health care professional as soon as possible: -allergic reactions like skin rash, itching or hives, swelling of the face, lips, or tongue -anxiety, confusion, or depression -breathing problems -changes in vision -feeling faint or lightheaded, falls -jaw burning, cramping, pain -muscle cramps, stiffness, or weakness -trouble passing urine or change in the amount of urine Side effects that usually do not require medical attention (report to your doctor or health care professional if they continue or are bothersome): -bone, joint, or muscle pain -  fever -hair loss -irritation at site where injected -loss of appetite -nausea, vomiting -stomach upset -tired This list may not describe all possible side effects. Call your doctor for medical advice about side effects. You may report side effects to FDA at  1-800-FDA-1088. Where should I keep my medicine? This drug is given in a hospital or clinic and will not be stored at home. NOTE: This sheet is a summary. It may not cover all possible information. If you have questions about this medicine, talk to your doctor, pharmacist, or health care provider.  2013, [employer]vier/Gold Standard. (11/30/2010 9:06:58 AM)

## 2015-05-01 ENCOUNTER — Other Ambulatory Visit: Payer: Self-pay | Admitting: Oncology

## 2015-06-13 ENCOUNTER — Other Ambulatory Visit: Payer: Self-pay | Admitting: Nurse Practitioner

## 2015-07-10 ENCOUNTER — Telehealth: Payer: Self-pay | Admitting: *Deleted

## 2015-07-10 ENCOUNTER — Ambulatory Visit (HOSPITAL_BASED_OUTPATIENT_CLINIC_OR_DEPARTMENT_OTHER): Payer: Medicare Other | Admitting: Oncology

## 2015-07-10 ENCOUNTER — Telehealth: Payer: Self-pay | Admitting: Oncology

## 2015-07-10 VITALS — BP 122/78 | HR 78 | Temp 98.7°F | Resp 18 | Ht 60.0 in | Wt 115.8 lb

## 2015-07-10 DIAGNOSIS — M8580 Other specified disorders of bone density and structure, unspecified site: Secondary | ICD-10-CM

## 2015-07-10 DIAGNOSIS — C50912 Malignant neoplasm of unspecified site of left female breast: Secondary | ICD-10-CM | POA: Diagnosis not present

## 2015-07-10 DIAGNOSIS — I890 Lymphedema, not elsewhere classified: Secondary | ICD-10-CM | POA: Diagnosis not present

## 2015-07-10 DIAGNOSIS — C773 Secondary and unspecified malignant neoplasm of axilla and upper limb lymph nodes: Secondary | ICD-10-CM | POA: Diagnosis not present

## 2015-07-10 DIAGNOSIS — Z853 Personal history of malignant neoplasm of breast: Secondary | ICD-10-CM

## 2015-07-10 NOTE — Telephone Encounter (Signed)
per pof to sch pt appt-gave pt copy of avs-sent MW email to sch trmt-pt aware °

## 2015-07-10 NOTE — Telephone Encounter (Signed)
Per staff message and POF I have scheduled appts. Advised scheduler of appts. JMW  

## 2015-07-10 NOTE — Progress Notes (Signed)
  [facility] OFFICE PROGRESS NOTE   Diagnosis: Breast cancer  INTERVAL HISTORY:   Ms. Shirley Melendez returns as scheduled. She continues Arimidex. She does not have significant hot flashes or arthralgias. She feels well. She continues to have lymphedema at the left arm and hand, partially relieved with a lymphedema sleeve and glove. No tooth pain or loosening.  Objective:  Vital signs in last 24 hours:  Blood pressure 122/78, pulse 78, temperature 98.7 F (37.1 C), temperature source Oral, resp. rate 18, height 5' (1.524 m), weight 115 lb 12.8 oz (52.527 kg), SpO2 99 %.    HEENT: Neck without mass Lymphatics: No cervical, supra-clavicular, or axillary nodes Resp: Lungs clear bilaterally Cardio: Regular rate and rhythm GI: No hepatomegaly Vascular: No leg edema, lymphedema sleeve at the left arm and hand Breasts: Right breast without mass. Status post left lumpectomy. Firm tissue at the lumpectomy scar. No evidence for local tumor recurrence.     Medications: I have reviewed the patient's current medications.  Assessment/Plan: 1. Stage I (T1 NX) left-sided breast cancer diagnosed in April 1997. She was treated with a lumpectomy, left breast radiation, CMF chemotherapy, and 5 years of tamoxifen. 2. Metastatic carcinoma involving a left axillary lymph node, ER-positive, PR-positive, and HER-2-negative. She is status post a biopsy of a left axillary lymph node 08/13/2010. Status post a left axillary lymph node dissection 09/13/2010 with the pathology confirming 3/11 axillary lymph nodes containing metastatic carcinoma. A staging PET scan on 08/23/2010 confirmed a hypermetabolic left axillary lymph node and no other evidence of metastatic disease.  1. Status post 4 cycles of "adjuvant" T-C chemotherapy with cycle #4 given on 01/07/2011. 2. She began Arimidex on 03/26/2011 3. Left arm lymphedema, likely lymphedema related to the left axillary lymph node dissection. She is now  followed at the lymphedema clinic. Improved with physical therapy and a lymphedema sleeve. 4. Pain and numbness at the fingers bilaterally-right greater than left, she reports being diagnosed with carpal tunnel syndrome and underwent a right sided carpal tunnel surgery with clinical improvement  5. Osteopenia-she received Zometa in July of 2016-no tooth/jaw symptoms , bone density scan May 2016 confirmed improved bone density at the spine and femoral neck 6. Hypertension  Disposition:  Shirley Melendez remains in clinical remission from breast cancer. She will continue Arimidex. She will schedule a mammogram for April.  Ms. Shirley Melendez will return for an office visit and Zometa in 6 months.  Betsy Coder, MD  07/10/2015  12:43 PM

## 2015-09-04 ENCOUNTER — Other Ambulatory Visit: Payer: Self-pay

## 2015-09-04 DIAGNOSIS — Z1231 Encounter for screening mammogram for malignant neoplasm of breast: Secondary | ICD-10-CM

## 2015-10-10 ENCOUNTER — Ambulatory Visit (INDEPENDENT_AMBULATORY_CARE_PROVIDER_SITE_OTHER): Payer: Medicare Other | Admitting: Internal Medicine

## 2015-10-10 ENCOUNTER — Encounter: Payer: Self-pay | Admitting: Internal Medicine

## 2015-10-10 VITALS — BP 124/76 | HR 80 | Temp 98.5°F | Resp 16 | Wt 117.0 lb

## 2015-10-10 DIAGNOSIS — Z1159 Encounter for screening for other viral diseases: Secondary | ICD-10-CM | POA: Diagnosis not present

## 2015-10-10 DIAGNOSIS — Z23 Encounter for immunization: Secondary | ICD-10-CM | POA: Diagnosis not present

## 2015-10-10 DIAGNOSIS — I10 Essential (primary) hypertension: Secondary | ICD-10-CM

## 2015-10-10 DIAGNOSIS — E785 Hyperlipidemia, unspecified: Secondary | ICD-10-CM

## 2015-10-10 MED ORDER — METOPROLOL TARTRATE 50 MG PO TABS: ORAL_TABLET | ORAL | Status: DC

## 2015-10-10 MED ORDER — SPIRONOLACTONE 25 MG PO TABS: ORAL_TABLET | ORAL | Status: DC

## 2015-10-10 NOTE — Patient Instructions (Signed)
  Test(s) ordered today. Your results will be released to [facility] (or called to you) after review, usually within 72hours after test completion. If any changes need to be made, you will be notified at that same time.  All other Health Maintenance issues reviewed.   All recommended immunizations and age-appropriate screenings are up-to-date or discussed.  prevnar pneumonia vaccine administered today.   Medications reviewed and updated.  No changes recommended at this time.   Please followup in one year

## 2015-10-10 NOTE — Progress Notes (Signed)
Pre visit review using our clinic review tool, if applicable. No additional management support is needed unless otherwise documented below in the visit note. 

## 2015-10-10 NOTE — Progress Notes (Signed)
Subjective:    Patient ID: Shirley Melendez, female    DOB: [birth year], 65 y.o.   MRN: [id number]  HPI She is here to establish with a new pcp.   History of breast cancer:  She follows with Dr Benay Spice.  She is on arimidex and zometa twice a year.  She wears a left arm sleeve for lymphedema.  She is taking cymbalta for her left axillary nerve pain, which helps.  She also takes percocet at night for the nerve pain.    Hypertension: She is taking her medication daily. She is compliant with a low sodium diet.  She denies chest pain, palpitations, edema, shortness of breath and regular headaches. She is exercising regularly.  She does monitor her blood pressure at home.    Hyperlipidemia: She hs never been on medication. She is compliant with a low fat/cholesterol diet. She is exercising regularly.   Medications and allergies reviewed with patient and updated if appropriate.  Patient Active Problem List   Diagnosis Date Noted  . Essential hypertension, benign 07/16/2013  . Lymphedema of upper extremity following lymphadenectomy 11/07/2011  . Nonspecific abnormal electrocardiogram (ECG) (EKG) 09/27/2011  . Cervicalgia 04/12/2009  . BACK PAIN, LUMBAR 04/12/2009  . Hyperlipidemia 08/24/2008  . ARTHRALGIA 08/24/2008  . Osteopenia 08/24/2008  . hx: breast cancer, left, invasive ductal carcinoma with axillary mets, receptor + 08/24/2008    Current Outpatient Prescriptions on File Prior to Visit  Medication Sig Dispense Refill  . anastrozole (ARIMIDEX) 1 MG tablet Take 1 tablet (1 mg total) by mouth daily. 30 tablet 4  . aspirin 81 MG tablet Take 81 mg by mouth daily.      . Calcium Carbonate-Vitamin D (CALCIUM 600 + D PO) Take 1 capsule by mouth 2 (two) times daily. 600/400    . DULoxetine (CYMBALTA) 60 MG capsule Take 60 mg by mouth daily.    Marland Kitchen ibuprofen (ADVIL,MOTRIN) 200 MG tablet Take 200 mg by mouth every 6 (six) hours as needed.      Marland Kitchen LYSINE PO Take 500 mg by mouth as needed.        . Multiple Vitamins-Minerals (SENIOR MULTIVITAMIN PLUS PO) Take by mouth daily.      Marland Kitchen oxyCODONE-acetaminophen (PERCOCET) 5-325 MG per tablet Take 1 tablet by mouth daily as needed.    . Vitamin E 200 UNITS TABS Take 400 Units by mouth daily.      No current facility-administered medications on file prior to visit.    Past Medical History  Diagnosis Date  . CTS (carpal tunnel syndrome) 2013    Dr [name] , Neurology  . Fasting hyperglycemia 2012    FBS 110  . Breast cancer ([place]) 1997    surgery, chemo, & radiation  . Breast cancer ([place]) 2012    surgery, chemo, & radiation with axillary dissection  . PONV (postoperative nausea and vomiting) 1997    no problems since  . 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 Arthritis     chronic neck and back pain  . MRSA (methicillin resistant staph aureus) culture positive 2012    swab was positive, treated per protocol.  No other issues.  . Osteopenia   . Hypertension   . Hyperlipidemia     Past Surgical History  Procedure Laterality Date  . Knee surgery  2000    left  . Axillary node dissection  08/2010    left  . Dilation and curettage,  diagnostic / therapeutic  2002  . Colonoscopy  2005    negative  . Breast lumpectomy  09/1995, 10/1995    left - lumpectomy  . Breast surgery  2012     axillary node dissection left  . Axillary node dissection Left 2012    with axillary dissection on left  . Carpal tunnel release  10/29/2011    Procedure: CARPAL TUNNEL RELEASE;  Surgeon: Cammie Sickle., MD;  Location: [facility];  Service: Orthopedics;  Laterality: Right;  . Trigger finger release  10/29/2011    Procedure: RELEASE TRIGGER FINGER/A-1 PULLEY;  Surgeon: Cammie Sickle., MD;  Location: [facility];  Service: Orthopedics;  Laterality: Right;  right long and right index     Social History   Social History  . Marital Status: Married    Spouse Name:  N/A  . Number of Children: N/A  . Years of Education: N/A   Social History Main Topics  . Smoking status: Never Smoker   . Smokeless tobacco: Never Used  . Alcohol Use: No  . Drug Use: No  . Sexual Activity: Not Asked   Other Topics Concern  . None   Social History Narrative   Retired Licensed conveyancer      Exercise: walking    Family History  Problem Relation Age of Onset  . Hypertension Mother   . Arthritis Mother     OA  . Arthritis Maternal Grandfather     OA  . Heart attack Maternal Grandfather 87  . Heart attack Maternal Uncle 65  . Stroke Neg Hx   . Diabetes Neg Hx     Review of Systems  Constitutional: Negative for fever.  Respiratory: Positive for cough (? allergies, after eating). Negative for shortness of breath and wheezing.   Cardiovascular: Negative for chest pain, palpitations and leg swelling.  Gastrointestinal: Negative for nausea and abdominal pain.       Occ gerd  Neurological: Positive for dizziness (rare), numbness (left axilla) and headaches (occasional). Negative for light-headedness.       Objective:   Filed Vitals:   10/10/15 1057  BP: 124/76  Pulse: 80  Temp: 98.5 F (36.9 C)  Resp: 16   Filed Weights   10/10/15 1057  Weight: 117 lb (53.071 kg)   Body mass index is 22.85 kg/(m^2).   Physical Exam Constitutional: Appears well-developed and well-nourished. No distress.  Neck: Neck supple. No tracheal deviation present. No thyromegaly present.  No carotid bruit. No cervical adenopathy.   Cardiovascular: Normal rate, regular rhythm and normal heart sounds.   No murmur heard.  No edema Pulmonary/Chest: Effort normal and breath sounds normal. No respiratory distress. No wheezes.  Abdomen: soft, nontender, nondistended Extremities: left arm with lymphedema sleeve, fingers slightly swollen       Assessment & Plan:   prevnar today Blood work ordered  See Problem List for Assessment and Plan of chronic medical problems.  Follow up  annually for a PE

## 2015-10-12 ENCOUNTER — Ambulatory Visit
Admission: RE | Admit: 2015-10-12 | Discharge: 2015-10-12 | Disposition: A | Discharge status: Home / Self Care | Payer: Medicare Other | Source: Ambulatory Visit

## 2015-10-12 DIAGNOSIS — Z1231 Encounter for screening mammogram for malignant neoplasm of breast: Secondary | ICD-10-CM

## 2015-10-30 ENCOUNTER — Other Ambulatory Visit (INDEPENDENT_AMBULATORY_CARE_PROVIDER_SITE_OTHER): Payer: Medicare Other

## 2015-10-30 ENCOUNTER — Encounter: Payer: Self-pay | Admitting: Internal Medicine

## 2015-10-30 DIAGNOSIS — I10 Essential (primary) hypertension: Secondary | ICD-10-CM | POA: Diagnosis not present

## 2015-10-30 DIAGNOSIS — E785 Hyperlipidemia, unspecified: Secondary | ICD-10-CM

## 2015-10-30 LAB — LIPID PANEL
CHOL/HDL RATIO: 4
CHOLESTEROL: 252 mg/dL — AB (ref 0–200)
HDL: 68.3 mg/dL (ref >39.00)
LDL CALC: 164 mg/dL — AB (ref 0–99)
NonHDL: 183.32
TRIGLYCERIDES: 97 mg/dL (ref 0.0–149.0)
VLDL: 19.4 mg/dL (ref 0.0–40.0)

## 2015-10-30 LAB — TSH: TSH: 2.05 u[IU]/mL (ref 0.35–4.50)

## 2015-10-30 LAB — CBC WITH DIFFERENTIAL/PLATELET
BASOS PCT: 0.4 % (ref 0.0–3.0)
Basophils Absolute: 0 10*3/uL (ref 0.0–0.1)
EOS PCT: 3 % (ref 0.0–5.0)
Eosinophils Absolute: 0.2 10*3/uL (ref 0.0–0.7)
HEMATOCRIT: 44.9 % (ref 36.0–46.0)
HEMOGLOBIN: 15.3 g/dL — AB (ref 12.0–15.0)
Lymphocytes Relative: 23.4 % (ref 12.0–46.0)
Lymphs Abs: 1.6 10*3/uL (ref 0.7–4.0)
MCHC: 34 g/dL (ref 30.0–36.0)
MCV: 93.2 fl (ref 78.0–100.0)
MONO ABS: 0.6 10*3/uL (ref 0.1–1.0)
Monocytes Relative: 9.3 % (ref 3.0–12.0)
Neutro Abs: 4.4 10*3/uL (ref 1.4–7.7)
Neutrophils Relative %: 63.9 % (ref 43.0–77.0)
Platelets: 271 10*3/uL (ref 150.0–400.0)
RBC: 4.82 Mil/uL (ref 3.87–5.11)
RDW: 13.2 % (ref 11.5–15.5)
WBC: 6.9 10*3/uL (ref 4.0–10.5)

## 2015-12-16 ENCOUNTER — Telehealth: Payer: Self-pay

## 2015-12-16 NOTE — Telephone Encounter (Signed)
Patient is on the list for [employer] 2017 and may be a good candidate for an AWV in 2017. Please let me know if/when appt is scheduled.   

## 2016-01-08 ENCOUNTER — Ambulatory Visit (HOSPITAL_BASED_OUTPATIENT_CLINIC_OR_DEPARTMENT_OTHER): Payer: Medicare Other

## 2016-01-08 ENCOUNTER — Ambulatory Visit: Payer: Medicare Other | Admitting: Oncology

## 2016-01-08 ENCOUNTER — Other Ambulatory Visit: Payer: Self-pay | Admitting: *Deleted

## 2016-01-08 ENCOUNTER — Telehealth: Payer: Self-pay | Admitting: Oncology

## 2016-01-08 ENCOUNTER — Other Ambulatory Visit (HOSPITAL_BASED_OUTPATIENT_CLINIC_OR_DEPARTMENT_OTHER): Payer: Medicare Other

## 2016-01-08 VITALS — BP 126/74 | HR 99 | Temp 98.6°F | Resp 18 | Ht 60.0 in | Wt 114.5 lb

## 2016-01-08 DIAGNOSIS — Z853 Personal history of malignant neoplasm of breast: Secondary | ICD-10-CM

## 2016-01-08 DIAGNOSIS — M899 Disorder of bone, unspecified: Secondary | ICD-10-CM

## 2016-01-08 DIAGNOSIS — I890 Lymphedema, not elsewhere classified: Secondary | ICD-10-CM | POA: Diagnosis not present

## 2016-01-08 DIAGNOSIS — I10 Essential (primary) hypertension: Secondary | ICD-10-CM | POA: Diagnosis not present

## 2016-01-08 DIAGNOSIS — M949 Disorder of cartilage, unspecified: Principal | ICD-10-CM

## 2016-01-08 DIAGNOSIS — M8580 Other specified disorders of bone density and structure, unspecified site: Secondary | ICD-10-CM

## 2016-01-08 LAB — BASIC METABOLIC PANEL
ANION GAP: 9 meq/L (ref 3–11)
BUN: 15.9 mg/dL (ref 7.0–26.0)
CALCIUM: 10.1 mg/dL (ref 8.4–10.4)
CO2: 28 mEq/L (ref 22–29)
Chloride: 104 mEq/L (ref 98–109)
Creatinine: 1.1 mg/dL (ref 0.6–1.1)
EGFR: 55 mL/min/{1.73_m2} — AB (ref >90)
Glucose: 82 mg/dl (ref 70–140)
POTASSIUM: 4.3 meq/L (ref 3.5–5.1)
Sodium: 141 mEq/L (ref 136–145)

## 2016-01-08 MED ORDER — ZOLEDRONIC ACID 4 MG/100ML IV SOLN
4.0000 mg | Freq: Once | INTRAVENOUS | Status: AC
  Administered 2016-01-08: 4 mg via INTRAVENOUS
  Filled 2016-01-08: qty 100

## 2016-01-08 MED ORDER — SODIUM CHLORIDE 0.9 % IV SOLN
Freq: Once | INTRAVENOUS | Status: AC
  Administered 2016-01-08: 12:00:00 via INTRAVENOUS

## 2016-01-08 MED ORDER — ANASTROZOLE 1 MG PO TABS: 1.0000 mg | ORAL_TABLET | Freq: Every day | ORAL | 4 refills | Status: DC

## 2016-01-08 NOTE — Telephone Encounter (Signed)
appt made and avs printed °

## 2016-01-08 NOTE — Patient Instructions (Signed)

## 2016-01-08 NOTE — Telephone Encounter (Signed)
Appointments for January 2018 (6 month f/u) already on schedule. Gave patient avs report and appointments for January 2018.

## 2016-01-08 NOTE — Progress Notes (Signed)
  [facility] OFFICE PROGRESS NOTE   Diagnosis: Breast cancer  INTERVAL HISTORY:   Shirley Melendez returns as scheduled. She continues Arimidex. Mild hot flashes. She has noted some improvement in the left arm lymphedema. No change over either breast. Good appetite. Mild arthralgias. Shirley Melendez had a bilateral mammogram 10/12/2015. This was a negative study. No jaw or tooth pain. Objective:  Vital signs in last 24 hours:  Blood pressure 126/74, pulse 99, temperature 98.6 F (37 C), temperature source Oral, resp. rate 18, height 5' (1.524 m), weight 114 lb 8 oz (51.9 kg), SpO2 99 %.    HEENT: Neck without mass Lymphatics: No cervical, supra-clavicular, or axillary nodes Resp: Lungs clear bilaterally Cardio: Regular rate and rhythm GI: No hepatomegaly Vascular: No leg edema Breast: Status post left lumpectomy. No evidence for local tumor recurrence. No mass in either breast.    Medications: I have reviewed the patient's current medications.  Assessment/Plan: 1. Stage I (T1 NX) left-sided breast cancer diagnosed in April 1997. She was treated with a lumpectomy, left breast radiation, CMF chemotherapy, and 5 years of tamoxifen. 2. Metastatic carcinoma involving a left axillary lymph node, ER-positive, PR-positive, and HER-2-negative. She is status post a biopsy of a left axillary lymph node 08/13/2010. Status post a left axillary lymph node dissection 09/13/2010 with the pathology confirming 3/11 axillary lymph nodes containing metastatic carcinoma. A staging PET scan on 08/23/2010 confirmed a hypermetabolic left axillary lymph node and no other evidence of metastatic disease.  1. Status post 4 cycles of "adjuvant" T-C chemotherapy with cycle #4 given on 01/07/2011. 2. She began Arimidex on 03/26/2011 3. Left arm lymphedema, likely lymphedema related to the left axillary lymph node dissection. She is now followed at the lymphedema clinic. Improved with physical therapy and a  lymphedema sleeve. 4. Pain and numbness at the fingers bilaterally-right greater than left, she reports being diagnosed with carpal tunnel syndrome and underwent a right sided carpal tunnel surgery with clinical improvement  5. Osteopenia-she received Zometa in July of 2017-no tooth/jaw symptoms , bone density scan May 2016 confirmed improved bone density at the spine and femoral neck 6. Hypertension    Disposition:  She remains in clinical remission from breast cancer. Shirley Melendez will receive Zometa today. She will return for an office visit in 6 months.  Shirley Coder, MD  01/08/2016  11:20 AM

## 2016-02-23 ENCOUNTER — Ambulatory Visit (INDEPENDENT_AMBULATORY_CARE_PROVIDER_SITE_OTHER): Payer: Medicare Other | Admitting: Emergency Medicine

## 2016-02-23 DIAGNOSIS — Z23 Encounter for immunization: Secondary | ICD-10-CM

## 2016-02-23 NOTE — Progress Notes (Signed)
Injection given.   Shirley Melendez J Shirley Glendinning, MD  

## 2016-07-08 ENCOUNTER — Ambulatory Visit (HOSPITAL_BASED_OUTPATIENT_CLINIC_OR_DEPARTMENT_OTHER): Payer: Medicare Other | Admitting: Oncology

## 2016-07-08 ENCOUNTER — Other Ambulatory Visit (HOSPITAL_BASED_OUTPATIENT_CLINIC_OR_DEPARTMENT_OTHER): Payer: Medicare Other

## 2016-07-08 ENCOUNTER — Telehealth: Payer: Self-pay | Admitting: Oncology

## 2016-07-08 ENCOUNTER — Telehealth: Payer: Self-pay | Admitting: *Deleted

## 2016-07-08 ENCOUNTER — Ambulatory Visit: Payer: Medicare Other

## 2016-07-08 VITALS — BP 117/73 | HR 83 | Temp 98.6°F | Resp 20 | Ht 60.0 in | Wt 114.4 lb

## 2016-07-08 DIAGNOSIS — I10 Essential (primary) hypertension: Secondary | ICD-10-CM

## 2016-07-08 DIAGNOSIS — C773 Secondary and unspecified malignant neoplasm of axilla and upper limb lymph nodes: Secondary | ICD-10-CM

## 2016-07-08 DIAGNOSIS — Z170 Estrogen receptor positive status [ER+]: Secondary | ICD-10-CM | POA: Diagnosis not present

## 2016-07-08 DIAGNOSIS — C50912 Malignant neoplasm of unspecified site of left female breast: Secondary | ICD-10-CM

## 2016-07-08 DIAGNOSIS — M8580 Other specified disorders of bone density and structure, unspecified site: Secondary | ICD-10-CM

## 2016-07-08 DIAGNOSIS — I890 Lymphedema, not elsewhere classified: Secondary | ICD-10-CM

## 2016-07-08 DIAGNOSIS — Z853 Personal history of malignant neoplasm of breast: Secondary | ICD-10-CM

## 2016-07-08 LAB — BASIC METABOLIC PANEL
ANION GAP: 8 meq/L (ref 3–11)
BUN: 15.4 mg/dL (ref 7.0–26.0)
CALCIUM: 10 mg/dL (ref 8.4–10.4)
CO2: 30 meq/L — AB (ref 22–29)
CREATININE: 1.1 mg/dL (ref 0.6–1.1)
Chloride: 102 mEq/L (ref 98–109)
EGFR: 55 mL/min/{1.73_m2} — AB (ref >90)
GLUCOSE: 98 mg/dL (ref 70–140)
Potassium: 4.2 mEq/L (ref 3.5–5.1)
Sodium: 139 mEq/L (ref 136–145)

## 2016-07-08 NOTE — Telephone Encounter (Signed)
Per 1/22 LOS and staff message I have scheduled appt and notified the scheduler

## 2016-07-08 NOTE — Telephone Encounter (Signed)
Bone Density scheduled with [facility] for 11/13/16 @ 2 p.m. (s/w Mardene Celeste) patient is aware of appointment. Appointments scheduled per 07/08/16 los. Patient was given a copy of the appointment schedule and AVS report, per 07/08/16 los.

## 2016-07-08 NOTE — Progress Notes (Signed)
  [place] OFFICE PROGRESS NOTE   Diagnosis: Breast cancer  INTERVAL HISTORY:   Shirley Melendez returns as scheduled. A bilateral mammogram 10/12/2015 was negative. She continues Arimidex. No change over the chest wall. Mild hot flashes and arthralgias. She reports the arthralgias predated starting Arimidex.   Objective:  Vital signs in last 24 hours:  Blood pressure 117/73, pulse 83, temperature 98.6 F (37 C), temperature source Oral, resp. rate 20, height 5' (1.524 m), weight 114 lb 6.4 oz (51.9 kg).    HEENT: Neck without mass Lymphatics: No cervical, supraclavicular, or left axillary nodes.? 1/2 cm soft mobile right axillary node versus prominent fat tissue Resp: Lungs clear bilaterally Cardio: Regular rate and rhythm GI: No hepatomegaly Vascular: No leg edema, left arm lymphedema sleeve in place Breasts: Right breast without mass. Status post left lumpectomy. No evidence for local tumor recurrence.    Medications: I have reviewed the patient's current medications.  Assessment/Plan: 1. Stage I (T1 NX) left-sided breast cancer diagnosed in April 1997. She was treated with a lumpectomy, left breast radiation, CMF chemotherapy, and 5 years of tamoxifen. 2. Metastatic carcinoma involving a left axillary lymph node, ER-positive, PR-positive, and HER-2-negative. She is status post a biopsy of a left axillary lymph node 08/13/2010. Status post a left axillary lymph node dissection 09/13/2010 with the pathology confirming 3/11 axillary lymph nodes containing metastatic carcinoma. A staging PET scan on 08/23/2010 confirmed a hypermetabolic left axillary lymph node and no other evidence of metastatic disease.  1. Status post 4 cycles of "adjuvant" T-C chemotherapy with cycle #4 given on 01/07/2011. 2. She began Arimidex on 03/26/2011 3. Left arm lymphedema, likely lymphedema related to the left axillary lymph node dissection. She is now followed at the lymphedema clinic.  Improved with physical therapy and a lymphedema sleeve. 4. Pain and numbness at the fingers bilaterally-right greater than left, she reports being diagnosed with carpal tunnel syndrome and underwent a right sided carpal tunnel surgery with clinical improvement  5. Osteopenia-she received Zometa in July of 2017-no tooth/jaw symptoms , bone density scan May 2016 confirmed improved bone density at the spine and femoral neck 6. Hypertension     Disposition:  Shirley Melendez remains in clinical remission from breast cancer. She will continue Arimidex. She will return for an office visit and Zometa in 6 months. She will be scheduled for a bone density scan in May.  Shirley Coder, MD  07/08/2016  11:10 AM

## 2016-07-18 ENCOUNTER — Other Ambulatory Visit: Payer: Self-pay | Admitting: Internal Medicine

## 2016-07-18 DIAGNOSIS — I10 Essential (primary) hypertension: Secondary | ICD-10-CM

## 2016-10-02 ENCOUNTER — Other Ambulatory Visit: Payer: Self-pay | Admitting: Internal Medicine

## 2016-10-02 DIAGNOSIS — I10 Essential (primary) hypertension: Secondary | ICD-10-CM

## 2016-10-09 ENCOUNTER — Other Ambulatory Visit: Payer: Self-pay | Admitting: Internal Medicine

## 2016-10-09 ENCOUNTER — Other Ambulatory Visit: Payer: Self-pay | Admitting: Family Medicine

## 2016-10-09 DIAGNOSIS — Z1231 Encounter for screening mammogram for malignant neoplasm of breast: Secondary | ICD-10-CM

## 2016-10-11 ENCOUNTER — Telehealth: Payer: Self-pay | Admitting: *Deleted

## 2016-10-11 DIAGNOSIS — Z853 Personal history of malignant neoplasm of breast: Secondary | ICD-10-CM

## 2016-10-11 NOTE — Telephone Encounter (Signed)
Message from pt requesting prescription for lymphedema sleeve and glove. Reviewed with Dr. Benay Spice, order received.

## 2016-11-01 ENCOUNTER — Ambulatory Visit
Admission: RE | Admit: 2016-11-01 | Discharge: 2016-11-01 | Disposition: A | Discharge status: Home / Self Care | Payer: Medicare Other | Source: Ambulatory Visit | Attending: Internal Medicine | Admitting: Internal Medicine

## 2016-11-01 DIAGNOSIS — Z1231 Encounter for screening mammogram for malignant neoplasm of breast: Secondary | ICD-10-CM

## 2016-11-01 HISTORY — DX: Personal history of irradiation: Z92.3

## 2016-11-01 HISTORY — DX: Personal history of antineoplastic chemotherapy: Z92.21

## 2016-11-13 ENCOUNTER — Ambulatory Visit (INDEPENDENT_AMBULATORY_CARE_PROVIDER_SITE_OTHER)
Admission: RE | Admit: 2016-11-13 | Discharge: 2016-11-13 | Disposition: A | Discharge status: Home / Self Care | Payer: Medicare Other | Source: Ambulatory Visit | Attending: Oncology | Admitting: Oncology

## 2016-11-13 DIAGNOSIS — M859 Disorder of bone density and structure, unspecified: Secondary | ICD-10-CM

## 2016-11-13 DIAGNOSIS — M8580 Other specified disorders of bone density and structure, unspecified site: Secondary | ICD-10-CM

## 2016-12-09 ENCOUNTER — Telehealth: Payer: Self-pay | Admitting: *Deleted

## 2016-12-09 NOTE — Telephone Encounter (Signed)
Call from [place] with Dr. Edison Simon (dentist): pt saw endodontist in Feb. She has a tooth that can not be saved, they are "trying to hold on to it as long as possible." Arbie Cookey is asking if Dr. Benay Spice has received note from [place] and [place] office.  We had not, requested and received handwritten note regarding tooth #25. Placed on MD desk for review.  Pt is scheduled to receive her yearly zometa infusion on 7/23.

## 2016-12-11 ENCOUNTER — Encounter: Payer: Self-pay | Admitting: Internal Medicine

## 2016-12-11 ENCOUNTER — Ambulatory Visit (INDEPENDENT_AMBULATORY_CARE_PROVIDER_SITE_OTHER): Payer: Medicare Other | Admitting: Internal Medicine

## 2016-12-11 VITALS — BP 120/72 | HR 63 | Temp 98.7°F | Resp 16 | Ht 60.0 in | Wt 110.0 lb

## 2016-12-11 DIAGNOSIS — I10 Essential (primary) hypertension: Secondary | ICD-10-CM | POA: Diagnosis not present

## 2016-12-11 DIAGNOSIS — R7303 Prediabetes: Secondary | ICD-10-CM | POA: Insufficient documentation

## 2016-12-11 DIAGNOSIS — Z0000 Encounter for general adult medical examination without abnormal findings: Secondary | ICD-10-CM | POA: Diagnosis not present

## 2016-12-11 DIAGNOSIS — Z853 Personal history of malignant neoplasm of breast: Secondary | ICD-10-CM

## 2016-12-11 DIAGNOSIS — Z23 Encounter for immunization: Secondary | ICD-10-CM

## 2016-12-11 DIAGNOSIS — E8989 Other postprocedural endocrine and metabolic complications and disorders: Secondary | ICD-10-CM

## 2016-12-11 DIAGNOSIS — Z1159 Encounter for screening for other viral diseases: Secondary | ICD-10-CM

## 2016-12-11 DIAGNOSIS — E7800 Pure hypercholesterolemia, unspecified: Secondary | ICD-10-CM | POA: Diagnosis not present

## 2016-12-11 DIAGNOSIS — I890 Lymphedema, not elsewhere classified: Secondary | ICD-10-CM

## 2016-12-11 DIAGNOSIS — M8580 Other specified disorders of bone density and structure, unspecified site: Secondary | ICD-10-CM

## 2016-12-11 DIAGNOSIS — R739 Hyperglycemia, unspecified: Secondary | ICD-10-CM | POA: Diagnosis not present

## 2016-12-11 HISTORY — DX: Prediabetes: R73.03

## 2016-12-11 NOTE — Assessment & Plan Note (Addendum)
dexa up to date - monitored by oncology Taking calcium, vitamin d daily Having zomeda annually by oncology

## 2016-12-11 NOTE — Assessment & Plan Note (Signed)
Check a1c Low sugar / carb diet Stressed regular exercise   

## 2016-12-11 NOTE — Patient Instructions (Addendum)
Shirley Melendez , Thank you for taking time to come for your Medicare Wellness Visit. I appreciate your ongoing commitment to your health goals. Please review the following plan we discussed and let me know if I can assist you in the future.   These are the goals we discussed: Goals    Increase your exercise, monitor your weight      This is a list of the screening recommended for you and due dates:  Health Maintenance  Topic Date Due  .  Hepatitis C: One time screening is recommended by Center for Disease Control  (CDC) for  adults born from [birth year] through 1966.   ordered  . Pneumonia vaccines (2 of 2 - PPSV23) today  . Flu Shot  01/15/2017  . Tetanus Vaccine  08/25/2018  . Mammogram  11/02/2018  . DEXA scan (bone density measurement)  11/14/2018  . Colon Cancer Screening  11/24/2024    Test(s) ordered today. Your results will be released to [place] (or called to you) after review, usually within 72hours after test completion. If any changes need to be made, you will be notified at that same time.  All other Health Maintenance issues reviewed.   All recommended immunizations and age-appropriate screenings are up-to-date or discussed.  Pneumovax immunization administered today.   Medications reviewed and updated.   No changes recommended at this time.    Please followup in one year for a physical   Health Maintenance, Female Adopting a healthy lifestyle and getting preventive care can go a long way to promote health and wellness. Talk with your health care provider about what schedule of regular examinations is right for you. This is a good chance for you to check in with your provider about disease prevention and staying healthy. In between checkups, there are plenty of things you can do on your own. Experts have done a lot of research about which lifestyle changes and preventive measures are most likely to keep you healthy. Ask your health care provider for more information. Weight  and diet Eat a healthy diet  Be sure to include plenty of vegetables, fruits, low-fat dairy products, and lean protein.  Do not eat a lot of foods high in solid fats, added sugars, or salt.  Get regular exercise. This is one of the most important things you can do for your health. ? Most adults should exercise for at least 150 minutes each week. The exercise should increase your heart rate and make you sweat (moderate-intensity exercise). ? Most adults should also do strengthening exercises at least twice a week. This is in addition to the moderate-intensity exercise.  Maintain a healthy weight  Body mass index (BMI) is a measurement that can be used to identify possible weight problems. It estimates body fat based on height and weight. Your health care provider can help determine your BMI and help you achieve or maintain a healthy weight.  For females 2 years of age and older: ? A BMI below 18.5 is considered underweight. ? A BMI of 18.5 to 24.9 is normal. ? A BMI of 25 to 29.9 is considered overweight. ? A BMI of 30 and above is considered obese.  Watch levels of cholesterol and blood lipids  You should start having your blood tested for lipids and cholesterol at 67 years of age, then have this test every 5 years.  You may need to have your cholesterol levels checked more often if: ? Your lipid or cholesterol levels are high. ? You  are older than 67 years of age. ? You are at high risk for heart disease.  Cancer screening Lung Cancer  Lung cancer screening is recommended for adults 6-42 years old who are at high risk for lung cancer because of a history of smoking.  A yearly low-dose CT scan of the lungs is recommended for people who: ? Currently smoke. ? Have quit within the past 15 years. ? Have at least a 30-pack-year history of smoking. A pack year is smoking an average of one pack of cigarettes a day for 1 year.  Yearly screening should continue until it has been 15  years since you quit.  Yearly screening should stop if you develop a health problem that would prevent you from having lung cancer treatment.  Breast Cancer  Practice breast self-awareness. This means understanding how your breasts normally appear and feel.  It also means doing regular breast self-exams. Let your health care provider know about any changes, no matter how small.  If you are in your 20s or 30s, you should have a clinical breast exam (CBE) by a health care provider every 1-3 years as part of a regular health exam.  If you are 15 or older, have a CBE every year. Also consider having a breast X-ray (mammogram) every year.  If you have a family history of breast cancer, talk to your health care provider about genetic screening.  If you are at high risk for breast cancer, talk to your health care provider about having an MRI and a mammogram every year.  Breast cancer gene (BRCA) assessment is recommended for women who have family members with BRCA-related cancers. BRCA-related cancers include: ? Breast. ? Ovarian. ? Tubal. ? Peritoneal cancers.  Results of the assessment will determine the need for genetic counseling and BRCA1 and BRCA2 testing.  Cervical Cancer Your health care provider may recommend that you be screened regularly for cancer of the pelvic organs (ovaries, uterus, and vagina). This screening involves a pelvic examination, including checking for microscopic changes to the surface of your cervix (Pap test). You may be encouraged to have this screening done every 3 years, beginning at age 46.  For women ages 29-65, health care providers may recommend pelvic exams and Pap testing every 3 years, or they may recommend the Pap and pelvic exam, combined with testing for human papilloma virus (HPV), every 5 years. Some types of HPV increase your risk of cervical cancer. Testing for HPV may also be done on women of any age with unclear Pap test results.  Other health  care providers may not recommend any screening for nonpregnant women who are considered low risk for pelvic cancer and who do not have symptoms. Ask your health care provider if a screening pelvic exam is right for you.  If you have had past treatment for cervical cancer or a condition that could lead to cancer, you need Pap tests and screening for cancer for at least 20 years after your treatment. If Pap tests have been discontinued, your risk factors (such as having a new sexual partner) need to be reassessed to determine if screening should resume. Some women have medical problems that increase the chance of getting cervical cancer. In these cases, your health care provider may recommend more frequent screening and Pap tests.  Colorectal Cancer  This type of cancer can be detected and often prevented.  Routine colorectal cancer screening usually begins at 67 years of age and continues through 67 years of age.  Your health care provider may recommend screening at an earlier age if you have risk factors for colon cancer.  Your health care provider may also recommend using home test kits to check for hidden blood in the stool.  A small camera at the end of a tube can be used to examine your colon directly (sigmoidoscopy or colonoscopy). This is done to check for the earliest forms of colorectal cancer.  Routine screening usually begins at age 35.  Direct examination of the colon should be repeated every 5-10 years through 67 years of age. However, you may need to be screened more often if early forms of precancerous polyps or small growths are found.  Skin Cancer  Check your skin from head to toe regularly.  Tell your health care provider about any new moles or changes in moles, especially if there is a change in a mole's shape or color.  Also tell your health care provider if you have a mole that is larger than the size of a pencil eraser.  Always use sunscreen. Apply sunscreen liberally  and repeatedly throughout the day.  Protect yourself by wearing long sleeves, pants, a wide-brimmed hat, and sunglasses whenever you are outside.  Heart disease, diabetes, and high blood pressure  High blood pressure causes heart disease and increases the risk of stroke. High blood pressure is more likely to develop in: ? People who have blood pressure in the high end of the normal range (130-139/85-89 mm Hg). ? People who are overweight or obese. ? People who are African American.  If you are 2-71 years of age, have your blood pressure checked every 3-5 years. If you are 7 years of age or older, have your blood pressure checked every year. You should have your blood pressure measured twice-once when you are at a hospital or clinic, and once when you are not at a hospital or clinic. Record the average of the two measurements. To check your blood pressure when you are not at a hospital or clinic, you can use: ? An automated blood pressure machine at a pharmacy. ? A home blood pressure monitor.  If you are between 43 years and 98 years old, ask your health care provider if you should take aspirin to prevent strokes.  Have regular diabetes screenings. This involves taking a blood sample to check your fasting blood sugar level. ? If you are at a normal weight and have a low risk for diabetes, have this test once every three years after 67 years of age. ? If you are overweight and have a high risk for diabetes, consider being tested at a younger age or more often. Preventing infection Hepatitis B  If you have a higher risk for hepatitis B, you should be screened for this virus. You are considered at high risk for hepatitis B if: ? You were born in a country where hepatitis B is common. Ask your health care provider which countries are considered high risk. ? Your parents were born in a high-risk country, and you have not been immunized against hepatitis B (hepatitis B vaccine). ? You have HIV  or AIDS. ? You use needles to inject street drugs. ? You live with someone who has hepatitis B. ? You have had sex with someone who has hepatitis B. ? You get hemodialysis treatment. ? You take certain medicines for conditions, including cancer, organ transplantation, and autoimmune conditions.  Hepatitis C  Blood testing is recommended for: ? Everyone born from [birth year] through  1966. ? Anyone with known risk factors for hepatitis C.  Sexually transmitted infections (STIs)  You should be screened for sexually transmitted infections (STIs) including gonorrhea and chlamydia if: ? You are sexually active and are younger than 67 years of age. ? You are older than 68 years of age and your health care provider tells you that you are at risk for this type of infection. ? Your sexual activity has changed since you were last screened and you are at an increased risk for chlamydia or gonorrhea. Ask your health care provider if you are at risk.  If you do not have HIV, but are at risk, it may be recommended that you take a prescription medicine daily to prevent HIV infection. This is called pre-exposure prophylaxis (PrEP). You are considered at risk if: ? You are sexually active and do not regularly use condoms or know the HIV status of your partner(s). ? You take drugs by injection. ? You are sexually active with a partner who has HIV.  Talk with your health care provider about whether you are at high risk of being infected with HIV. If you choose to begin PrEP, you should first be tested for HIV. You should then be tested every 3 months for as long as you are taking PrEP. Pregnancy  If you are premenopausal and you may become pregnant, ask your health care provider about preconception counseling.  If you may become pregnant, take 400 to 800 micrograms (mcg) of folic acid every day.  If you want to prevent pregnancy, talk to your health care provider about birth control  (contraception). Osteoporosis and menopause  Osteoporosis is a disease in which the bones lose minerals and strength with aging. This can result in serious bone fractures. Your risk for osteoporosis can be identified using a bone density scan.  If you are 19 years of age or older, or if you are at risk for osteoporosis and fractures, ask your health care provider if you should be screened.  Ask your health care provider whether you should take a calcium or vitamin D supplement to lower your risk for osteoporosis.  Menopause may have certain physical symptoms and risks.  Hormone replacement therapy may reduce some of these symptoms and risks. Talk to your health care provider about whether hormone replacement therapy is right for you. Follow these instructions at home:  Schedule regular health, dental, and eye exams.  Stay current with your immunizations.  Do not use any tobacco products including cigarettes, chewing tobacco, or electronic cigarettes.  If you are pregnant, do not drink alcohol.  If you are breastfeeding, limit how much and how often you drink alcohol.  Limit alcohol intake to no more than 1 drink per day for nonpregnant women. One drink equals 12 ounces of beer, 5 ounces of wine, or 1 ounces of hard liquor.  Do not use street drugs.  Do not share needles.  Ask your health care provider for help if you need support or information about quitting drugs.  Tell your health care provider if you often feel depressed.  Tell your health care provider if you have ever been abused or do not feel safe at home. This information is not intended to replace advice given to you by your health care provider. Make sure you discuss any questions you have with your health care provider. Document Released: 12/17/2010 Document Revised: 11/09/2015 Document Reviewed: 03/07/2015 Elsevier Interactive Patient Education  [employer].

## 2016-12-11 NOTE — Progress Notes (Signed)
Subjective:    Patient ID: Shirley Melendez, female    DOB: [birth year], 66 y.o.   MRN: [id number]  HPI Here for a subsequent medicare wellness exam and annual physical exam  I have personally reviewed and have noted 1.The patient's medical and social history 2.Their use of alcohol, tobacco or illicit drugs 3.Their current medications and supplements 4.The patient's functional ability including ADL's, fall risks, home safety risks and hearing or visual impairment. 5.Diet and physical activities 6.Evidence for depression or mood disorders 7.Care team reviewed  - Oncology - Dr Benay Spice, chiropractor - Dr Jola Baptist, Gyn - Dr Philis Pique, [facility] - Dr Willaim Rayas   Are there smokers in your home (other than you)? No  Risk Factors Exercise: walking - couple days a week Dietary issues discussed:  Eats well balanced, eats fruits/veges, cooks at home, decreased appetite with some weight loss  Cardiac risk factors: advanced age, hypertension, hyperlipidemia  Depression Screen  Have you felt down, depressed or hopeless? Yes, mild depression at times  Have you felt little interest or pleasure in doing things?  No  Activities of Daily Living In your present state of health, do you have any difficulty performing the following activities?:  Driving? No Managing money?  No Feeding yourself? No Getting from bed to chair? No Climbing a flight of stairs? No Preparing food and eating?: No Bathing or showering? No Getting dressed: No Getting to/using the toilet? No Moving around from place to place: No Cleaning house?  Some difficulty with household activities due to some back pain, left arm pain and knee pain In the past year have you fallen or had a near fall?: yes, she was in her house and turned quickly and did not move her shoe and lost her balance and fell quickly.  No injuries   Are you sexually active?   No  Do you have more than one partner?  N/A  Hearing Difficulties: yes Do you often ask people to speak up or repeat themselves? yes Do you experience ringing or noises in your ears? No Do you have difficulty understanding soft or whispered voices? yes Vision:              Any change in vision:  Yes, related to cataracts             Up to date with eye exam:   yes Memory:  Do you feel that you have a problem with memory? No, except names of something from the past  Do you often misplace items? No  Do you feel safe at home?  Yes  Cognitive Testing  Alert, Orientated? Yes  Normal Appearance? Yes  Recall of three objects?  Yes  Can perform simple calculations? Yes  Displays appropriate judgment? Yes  Can read the correct time from a watch face? Yes   Advanced Directives have been discussed with the patient? Yes - has living will   Medications and allergies reviewed with patient and updated if appropriate.  Patient Active Problem List   Diagnosis Date Noted  . Hyperglycemia 12/11/2016  . Essential hypertension, benign 07/16/2013  . Lymphedema of upper extremity following lymphadenectomy 11/07/2011  . Nonspecific abnormal electrocardiogram (ECG) (EKG) 09/27/2011  . Cervicalgia 04/12/2009  . BACK PAIN, LUMBAR 04/12/2009  . Hyperlipidemia 08/24/2008  . ARTHRALGIA 08/24/2008  . Osteopenia 08/24/2008  . hx: breast cancer, left, invasive ductal carcinoma with axillary mets, receptor + 08/24/2008    Current Outpatient Prescriptions on File Prior to Visit  Medication Sig Dispense Refill  . anastrozole (ARIMIDEX) 1 MG tablet Take 1 tablet (1 mg total) by mouth daily. 90 tablet 4  . aspirin 81 MG tablet Take 81 mg by mouth daily.      . Calcium Carbonate-Vitamin D (CALCIUM 600 + D PO) Take 1 capsule by mouth 2 (two) times daily. 600/400    . DULoxetine (CYMBALTA) 60 MG capsule Take 60 mg by mouth daily.    Marland Kitchen ibuprofen (ADVIL,MOTRIN) 200 MG tablet Take 200 mg by mouth every 6 (six)  hours as needed.      Marland Kitchen LYSINE PO Take 500 mg by mouth as needed.      . metoprolol (LOPRESSOR) 50 MG tablet Take 1 tablet (50 mg total) by mouth 2 (two) times daily. --- Office visit needed for further refills 180 tablet 0  . Multiple Vitamins-Minerals (SENIOR MULTIVITAMIN PLUS PO) Take by mouth daily.      Marland Kitchen oxyCODONE-acetaminophen (PERCOCET) 5-325 MG per tablet Take 1 tablet by mouth daily as needed.    Marland Kitchen spironolactone (ALDACTONE) 25 MG tablet Take 1 tablet (25 mg total) by mouth daily. --- Office visit needed for further refills 90 tablet 0  . Vitamin E 200 UNITS TABS Take 400 Units by mouth daily.      No current facility-administered medications on file prior to visit.     Past Medical History:  Diagnosis Date  . Arthritis    chronic neck and back pain  . Breast cancer ([place]) 1997   surgery, chemo, & radiation  . Breast cancer ([place]) 2012   surgery, chemo, & radiation with axillary dissection  . CTS (carpal tunnel syndrome) 2013   Dr [name] , Neurology  . Fasting hyperglycemia 2012   FBS 110  . Hyperlipidemia   . Hypertension   . 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 MRSA (methicillin resistant staph aureus) culture positive 2012   swab was positive, treated per protocol.  No other issues.  . Osteopenia   . Personal history of chemotherapy 1997  . Personal history of radiation therapy 1997   x2  . PONV (postoperative nausea and vomiting) 1997   no problems since    Past Surgical History:  Procedure Laterality Date  . AXILLARY NODE DISSECTION  08/2010   left  . AXILLARY NODE DISSECTION Left 2012   with axillary dissection on left  . BREAST BIOPSY  2012   x2  . BREAST LUMPECTOMY  09/1995, 10/1995   left - lumpectomy   . BREAST LUMPECTOMY  2012   Lumpectomy Axilla   . BREAST SURGERY  2012    axillary node dissection left  . CARPAL TUNNEL RELEASE  10/29/2011   Procedure: CARPAL TUNNEL RELEASE;  Surgeon: Cammie Sickle., MD;  Location: [facility];  Service: Orthopedics;  Laterality: Right;  . COLONOSCOPY  2005   negative  . DILATION AND CURETTAGE, DIAGNOSTIC / THERAPEUTIC  2002  . KNEE SURGERY  2000   left  . TRIGGER FINGER RELEASE  10/29/2011   Procedure: RELEASE TRIGGER FINGER/A-1 PULLEY;  Surgeon: Cammie Sickle., MD;  Location: [facility];  Service: Orthopedics;  Laterality: Right;  right long and right index     Social History   Social History  . Marital status: Married    Spouse name: N/A  . Number of children: N/A  . Years of education: N/A   Social History Main Topics  .  Smoking status: Never Smoker  . Smokeless tobacco: Never Used  . Alcohol use No  . Drug use: No  . Sexual activity: Not Asked   Other Topics Concern  . None   Social History Narrative   Retired Licensed conveyancer      Exercise: walking    Family History  Problem Relation Age of Onset  . Hypertension Mother   . Arthritis Mother        OA  . Arthritis Maternal Grandfather        OA  . Heart attack Maternal Grandfather 87  . Heart attack Maternal Uncle 65  . Stroke Neg Hx   . Diabetes Neg Hx   . Breast cancer Neg Hx     Review of Systems  Constitutional: Positive for appetite change (decreased). Negative for chills and fever.  HENT: Positive for hearing loss. Negative for tinnitus.   Eyes: Positive for visual disturbance (related to cataracts).  Respiratory: Negative for cough, shortness of breath and wheezing.   Cardiovascular: Negative for chest pain, palpitations and leg swelling.  Gastrointestinal: Negative for abdominal pain, blood in stool, constipation, diarrhea and nausea.       Rare gerd  Genitourinary: Negative for dysuria and hematuria.  Musculoskeletal: Positive for arthralgias (knee pain), back pain and neck pain.  Skin: Negative for color change and rash.  Neurological: Negative for light-headedness and headaches.  Psychiatric/Behavioral: Positive for  dysphoric mood (mild). The patient is nervous/anxious (mild).        Objective:   Vitals:   12/11/16 1404  BP: 120/72  Pulse: 63  Resp: 16  Temp: 98.7 F (37.1 C)   Filed Weights   12/11/16 1404  Weight: 110 lb (49.9 kg)   Body mass index is 21.48 kg/m.  Wt Readings from Last 3 Encounters:  12/11/16 110 lb (49.9 kg)  07/08/16 114 lb 6.4 oz (51.9 kg)  01/08/16 114 lb 8 oz (51.9 kg)     Physical Exam Constitutional: She appears well-developed and well-nourished. No distress.  HENT:  Head: Normocephalic and atraumatic.  Right Ear: External ear normal. Normal ear canal and TM Left Ear: External ear normal.  Normal ear canal and TM Mouth/Throat: Oropharynx is clear and moist.  Eyes: Conjunctivae and EOM are normal.  Neck: Neck supple. No tracheal deviation present. No thyromegaly present. No carotid bruit  Cardiovascular: Normal rate, regular rhythm and normal heart sounds.   No murmur heard.  No edema.  Lymphedema sleeve on left arm Pulmonary/Chest: Effort normal and breath sounds normal. No respiratory distress. She has no wheezes. She has no rales.  Breast: deferred to Gyn/oncology Abdominal: Soft. She exhibits no distension. There is no tenderness.  Lymphadenopathy: She has no cervical adenopathy.  Skin: Skin is warm and dry. She is not diaphoretic.  Psychiatric: She has a normal mood and affect. Her behavior is normal.         Assessment & Plan:   Wellness Exam: Immunizations  Pneumovax due - given today, discussed shingrix Colonoscopy  Up to date  Mammogram   Up to date  Dexa   Up to date  Gyn - almost due - will schedule - goes less often Eye exam  Up to date  Hearing loss  Yes - has seen audiology in the past - not ready for hearing aids Memory concerns/difficulties Independent of ADLs  Yes, but has some difficult with household chores Stressed the importance of regular exercise   Patient received copy of preventative screening tests/immunizations  recommended for  the next 5-10 years.  Physical exam: Screening blood work  ordered Immunizations  Pneumovax due - given today, discussed shingrix Colonoscopy  Up to date  Mammogram   Up to date  Dexa   Up to date  Gyn   Up to date  - due will schedule Eye exams  Up to date  EKG   Last done 2015 Exercise regular - try to increase walking Weight  - on low side - consider ensure or boost in between meals Skin  No concerns Substance abuse   none  See Problem List for Assessment and Plan of chronic medical problems.   FU annually

## 2016-12-11 NOTE — Assessment & Plan Note (Signed)
Following with pain management

## 2016-12-11 NOTE — Assessment & Plan Note (Signed)
Check lipid panel  

## 2016-12-11 NOTE — Assessment & Plan Note (Signed)
BP well controlled Current regimen effective and well tolerated Continue current medications at current doses  

## 2016-12-11 NOTE — Assessment & Plan Note (Signed)
Following with oncology 

## 2016-12-25 ENCOUNTER — Other Ambulatory Visit (INDEPENDENT_AMBULATORY_CARE_PROVIDER_SITE_OTHER): Payer: Medicare Other

## 2016-12-25 DIAGNOSIS — I10 Essential (primary) hypertension: Secondary | ICD-10-CM

## 2016-12-25 DIAGNOSIS — Z0000 Encounter for general adult medical examination without abnormal findings: Secondary | ICD-10-CM | POA: Diagnosis not present

## 2016-12-25 DIAGNOSIS — R739 Hyperglycemia, unspecified: Secondary | ICD-10-CM | POA: Diagnosis not present

## 2016-12-25 DIAGNOSIS — Z1159 Encounter for screening for other viral diseases: Secondary | ICD-10-CM

## 2016-12-25 LAB — CBC WITH DIFFERENTIAL/PLATELET
BASOS PCT: 0.5 % (ref 0.0–3.0)
Basophils Absolute: 0 10*3/uL (ref 0.0–0.1)
EOS PCT: 3.3 % (ref 0.0–5.0)
Eosinophils Absolute: 0.3 10*3/uL (ref 0.0–0.7)
HCT: 43.3 % (ref 36.0–46.0)
Hemoglobin: 14.9 g/dL (ref 12.0–15.0)
LYMPHS ABS: 1.4 10*3/uL (ref 0.7–4.0)
Lymphocytes Relative: 18.3 % (ref 12.0–46.0)
MCHC: 34.4 g/dL (ref 30.0–36.0)
MCV: 91.9 fl (ref 78.0–100.0)
MONO ABS: 0.7 10*3/uL (ref 0.1–1.0)
MONOS PCT: 8.7 % (ref 3.0–12.0)
NEUTROS ABS: 5.4 10*3/uL (ref 1.4–7.7)
NEUTROS PCT: 69.2 % (ref 43.0–77.0)
Platelets: 243 10*3/uL (ref 150.0–400.0)
RBC: 4.71 Mil/uL (ref 3.87–5.11)
RDW: 13.2 % (ref 11.5–15.5)
WBC: 7.7 10*3/uL (ref 4.0–10.5)

## 2016-12-25 LAB — COMPREHENSIVE METABOLIC PANEL
ALK PHOS: 60 U/L (ref 39–117)
ALT: 20 U/L (ref 0–35)
AST: 17 U/L (ref 0–37)
Albumin: 4.4 g/dL (ref 3.5–5.2)
BUN: 19 mg/dL (ref 6–23)
CHLORIDE: 101 meq/L (ref 96–112)
CO2: 31 mEq/L (ref 19–32)
Calcium: 9.8 mg/dL (ref 8.4–10.5)
Creatinine, Ser: 1.05 mg/dL (ref 0.40–1.20)
GFR: 55.52 mL/min — AB (ref >60.00)
GLUCOSE: 103 mg/dL — AB (ref 70–99)
POTASSIUM: 4.1 meq/L (ref 3.5–5.1)
SODIUM: 138 meq/L (ref 135–145)
TOTAL PROTEIN: 7 g/dL (ref 6.0–8.3)
Total Bilirubin: 0.9 mg/dL (ref 0.2–1.2)

## 2016-12-25 LAB — LIPID PANEL
CHOL/HDL RATIO: 3
Cholesterol: 260 mg/dL — ABNORMAL HIGH (ref 0–200)
HDL: 77.9 mg/dL (ref >39.00)
LDL Cholesterol: 154 mg/dL — ABNORMAL HIGH (ref 0–99)
NonHDL: 181.71
TRIGLYCERIDES: 140 mg/dL (ref 0.0–149.0)
VLDL: 28 mg/dL (ref 0.0–40.0)

## 2016-12-25 LAB — HEPATITIS C ANTIBODY: HCV Ab: NEGATIVE

## 2016-12-25 LAB — TSH: TSH: 2.36 u[IU]/mL (ref 0.35–4.50)

## 2016-12-25 LAB — HEMOGLOBIN A1C: HEMOGLOBIN A1C: 5.7 % (ref 4.6–6.5)

## 2016-12-27 ENCOUNTER — Encounter: Payer: Self-pay | Admitting: Internal Medicine

## 2016-12-31 ENCOUNTER — Other Ambulatory Visit: Payer: Self-pay | Admitting: Internal Medicine

## 2016-12-31 DIAGNOSIS — I10 Essential (primary) hypertension: Secondary | ICD-10-CM

## 2017-01-03 ENCOUNTER — Telehealth: Payer: Self-pay | Admitting: *Deleted

## 2017-01-03 ENCOUNTER — Other Ambulatory Visit: Payer: Self-pay | Admitting: *Deleted

## 2017-01-03 DIAGNOSIS — I890 Lymphedema, not elsewhere classified: Secondary | ICD-10-CM

## 2017-01-03 NOTE — Telephone Encounter (Signed)
"  I have an appointment with Dr Benay Spice Monday.  Concerns about Zometa due to problems with my teeth.  I saw my dentist, Ashok Pall ([phone]) June 25 th.  No problems this day but mentioned right lower tooth pain, aching to gum area that lasted ten days and faded away.  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  She said the body sometimes does this to teeth.  Dentist sent information to Dr. Benay Spice.  My teeth are not loose or wobbly.  No more pain or aches.  No swelling or numbness to gums or jaw.  No trouble talking, chewing but I did experience clicking of jaw with discomfort at night.  I thought my jaw had just settled wrong in my sleep position."

## 2017-01-06 ENCOUNTER — Ambulatory Visit: Payer: Medicare Other

## 2017-01-06 ENCOUNTER — Telehealth: Payer: Self-pay | Admitting: Oncology

## 2017-01-06 ENCOUNTER — Ambulatory Visit (HOSPITAL_BASED_OUTPATIENT_CLINIC_OR_DEPARTMENT_OTHER): Payer: Medicare Other | Admitting: Oncology

## 2017-01-06 ENCOUNTER — Other Ambulatory Visit (HOSPITAL_BASED_OUTPATIENT_CLINIC_OR_DEPARTMENT_OTHER): Payer: Medicare Other

## 2017-01-06 VITALS — BP 123/70 | HR 77 | Temp 98.6°F | Resp 18 | Ht 60.0 in | Wt 109.0 lb

## 2017-01-06 DIAGNOSIS — Z170 Estrogen receptor positive status [ER+]: Secondary | ICD-10-CM

## 2017-01-06 DIAGNOSIS — C50912 Malignant neoplasm of unspecified site of left female breast: Secondary | ICD-10-CM | POA: Diagnosis not present

## 2017-01-06 DIAGNOSIS — I890 Lymphedema, not elsewhere classified: Secondary | ICD-10-CM

## 2017-01-06 DIAGNOSIS — C773 Secondary and unspecified malignant neoplasm of axilla and upper limb lymph nodes: Secondary | ICD-10-CM

## 2017-01-06 DIAGNOSIS — I10 Essential (primary) hypertension: Secondary | ICD-10-CM | POA: Diagnosis not present

## 2017-01-06 DIAGNOSIS — Z79811 Long term (current) use of aromatase inhibitors: Secondary | ICD-10-CM

## 2017-01-06 DIAGNOSIS — M8580 Other specified disorders of bone density and structure, unspecified site: Secondary | ICD-10-CM | POA: Diagnosis not present

## 2017-01-06 DIAGNOSIS — Z853 Personal history of malignant neoplasm of breast: Secondary | ICD-10-CM

## 2017-01-06 LAB — BASIC METABOLIC PANEL
ANION GAP: 9 meq/L (ref 3–11)
BUN: 17.8 mg/dL (ref 7.0–26.0)
CO2: 26 mEq/L (ref 22–29)
Calcium: 9.9 mg/dL (ref 8.4–10.4)
Chloride: 103 mEq/L (ref 98–109)
Creatinine: 1 mg/dL (ref 0.6–1.1)
EGFR: 57 mL/min/{1.73_m2} — AB (ref >90)
Glucose: 112 mg/dl (ref 70–140)
POTASSIUM: 4 meq/L (ref 3.5–5.1)
SODIUM: 139 meq/L (ref 136–145)

## 2017-01-06 NOTE — Telephone Encounter (Signed)
Scheduled appt per sch message from Oren Beckmann - patient is aware of appt time and date.

## 2017-01-06 NOTE — Progress Notes (Signed)
[facility] OFFICE PROGRESS NOTE   Diagnosis: Breast cancer  INTERVAL HISTORY:   Shirley Melendez returns as scheduled. She continues Arimidex. No complaint. Stable left arm lymphedema. She wears a sleeve. A bone density scan 11/13/2016 revealed osteopenia at the femoral neck. She is taking calcium and vitamin D. A bilateral mammogram on 11/01/2016 was negative.  She had an episode of discomfort at tooth #25. This lasted for a few weeks. She saw Dr. Rolla Etienne and an endodontist. No treatment was recommended. Internal resorption of the tooth was noted.  Objective:  Vital signs in last 24 hours:  Blood pressure 123/70, pulse 77, temperature 98.6 F (37 C), temperature source Oral, resp. rate 18, height 5' (1.524 m), weight 109 lb (49.4 kg), SpO2 100 %.    HEENT: Neck without mass Lymphatics: No cervical, supraclavicular, or axillary nodes Resp: Lungs clear bilaterally Cardio: Regular rate and rhythm GI: No hepatomegaly Vascular: No leg edema, left arm lymphedema sleeve in place Breast: Status post left lumpectomy. No evidence for local tumor recurrence. Right breast without mass.    Lab Results:  Lab Results  Component Value Date   WBC 7.7 12/25/2016   HGB 14.9 12/25/2016   HCT 43.3 12/25/2016   MCV 91.9 12/25/2016   PLT 243.0 12/25/2016   NEUTROABS 5.4 12/25/2016    CMP     Component Value Date/Time   NA 139 01/06/2017 1200   K 4.0 01/06/2017 1200   CL 101 12/25/2016 0900   CO2 26 01/06/2017 1200   GLUCOSE 112 01/06/2017 1200   BUN 17.8 01/06/2017 1200   CREATININE 1.0 01/06/2017 1200   CALCIUM 9.9 01/06/2017 1200   PROT 7.0 12/25/2016 0900   ALBUMIN 4.4 12/25/2016 0900   AST 17 12/25/2016 0900   ALT 20 12/25/2016 0900   ALKPHOS 60 12/25/2016 0900   BILITOT 0.9 12/25/2016 0900   GFRNONAA >60 09/10/2010 1255   GFRAA  09/10/2010 1255    >60        The eGFR has been calculated using the MDRD equation. This calculation has not been validated in  all clinical situations. eGFR's persistently <60 mL/min signify possible Chronic Kidney Disease.    Medications: I have reviewed the patient's current medications.  Assessment/Plan: 1. Stage I (T1 NX) left-sided breast cancer diagnosed in April 1997. She was treated with a lumpectomy, left breast radiation, CMF chemotherapy, and 5 years of tamoxifen. 2. Metastatic carcinoma involving a left axillary lymph node, ER-positive, PR-positive, and HER-2-negative. She is status post a biopsy of a left axillary lymph node 08/13/2010. Status post a left axillary lymph node dissection 09/13/2010 with the pathology confirming 3/11 axillary lymph nodes containing metastatic carcinoma. A staging PET scan on 08/23/2010 confirmed a hypermetabolic left axillary lymph node and no other evidence of metastatic disease.  1. Status post 4 cycles of "adjuvant" T-C chemotherapy with cycle #4 given on 01/07/2011. 2. She began Arimidex on 03/26/2011 3. Left arm lymphedema, likely lymphedema related to the left axillary lymph node dissection. She is now followed at the lymphedema clinic. Improved with physical therapy and a lymphedema sleeve. 4. Pain and numbness at the fingers bilaterally-right greater than left, she reports being diagnosed with carpal tunnel syndrome and underwent a right sided carpal tunnel surgery with clinical improvement  5. Osteopenia-she received Zometa in July of 2017-, bone density scan May 2018 confirmed osteopenia at the femoral neck 6. Hypertension     Disposition:  Shirley Melendez remains in clinical remission from breast cancer. She will  continue Arimidex. She will return for an office visit in 6 months. She has been maintained on yearly Zometa. I will contact her dentist to discuss the recent tooth issue. We will be sure the dentist does not feel the tooth resorption is related to biphosphonate therapy.    [name], MD  01/06/2017  2:11 PM  Addendum: I discussed the case  with both Drs. Rolla Etienne and Sue Lush. They do not feel the tooth issue is related to biphosphonate therapy. Dr. Sue Lush recommends continuing the yearly Zometa. He may recommend holding Zometa if a tooth extraction is required in the future.

## 2017-01-06 NOTE — Telephone Encounter (Signed)
Dr. Benay Spice discussed with dentist and endodontist. OK to proceed with Zometa. Left message voicemail for pt with this information. Message to schedulers for appt.

## 2017-01-11 ENCOUNTER — Other Ambulatory Visit: Payer: Self-pay | Admitting: Internal Medicine

## 2017-01-11 DIAGNOSIS — I10 Essential (primary) hypertension: Secondary | ICD-10-CM

## 2017-01-14 ENCOUNTER — Ambulatory Visit (HOSPITAL_BASED_OUTPATIENT_CLINIC_OR_DEPARTMENT_OTHER): Payer: Medicare Other

## 2017-01-14 ENCOUNTER — Telehealth: Payer: Self-pay

## 2017-01-14 VITALS — BP 138/73 | HR 85 | Temp 99.3°F | Resp 18

## 2017-01-14 DIAGNOSIS — M8580 Other specified disorders of bone density and structure, unspecified site: Secondary | ICD-10-CM | POA: Diagnosis not present

## 2017-01-14 DIAGNOSIS — M899 Disorder of bone, unspecified: Secondary | ICD-10-CM

## 2017-01-14 DIAGNOSIS — M949 Disorder of cartilage, unspecified: Principal | ICD-10-CM

## 2017-01-14 MED ORDER — SODIUM CHLORIDE 0.9 % IV SOLN
Freq: Once | INTRAVENOUS | Status: AC
  Administered 2017-01-14: 13:00:00 via INTRAVENOUS

## 2017-01-14 MED ORDER — ZOLEDRONIC ACID 4 MG/5ML IV CONC
4.0000 mg | Freq: Once | INTRAVENOUS | Status: AC
  Administered 2017-01-14: 4 mg via INTRAVENOUS
  Filled 2017-01-14: qty 5

## 2017-01-14 NOTE — Telephone Encounter (Signed)
Called to confirm with pt about scheduling of infusion appointments for Zometa. Pt appreciative of call back.

## 2017-01-14 NOTE — Patient Instructions (Signed)

## 2017-03-02 ENCOUNTER — Other Ambulatory Visit: Payer: Self-pay | Admitting: Oncology

## 2017-03-02 DIAGNOSIS — Z853 Personal history of malignant neoplasm of breast: Secondary | ICD-10-CM

## 2017-04-01 ENCOUNTER — Ambulatory Visit (INDEPENDENT_AMBULATORY_CARE_PROVIDER_SITE_OTHER): Payer: Medicare Other | Admitting: General Practice

## 2017-04-01 DIAGNOSIS — Z23 Encounter for immunization: Secondary | ICD-10-CM | POA: Diagnosis not present

## 2017-06-22 ENCOUNTER — Telehealth: Payer: Self-pay | Admitting: Oncology

## 2017-06-22 NOTE — Telephone Encounter (Signed)
LVM ADVISING APPT MOVED FROM 1/21 TO 1/22 @ 10.30AM.

## 2017-06-26 ENCOUNTER — Telehealth: Payer: Self-pay

## 2017-06-26 NOTE — Telephone Encounter (Signed)
Returned patient call. Spoke with patient and confirmed appointments. Also, per Dr. Benay Spice, patient likely will not need Zometa on 07/08/17. Patient voiced understanding.

## 2017-07-07 ENCOUNTER — Other Ambulatory Visit: Payer: Medicare Other

## 2017-07-07 ENCOUNTER — Ambulatory Visit: Payer: Medicare Other

## 2017-07-07 ENCOUNTER — Ambulatory Visit: Payer: Medicare Other | Admitting: Oncology

## 2017-07-08 ENCOUNTER — Inpatient Hospital Stay: Payer: Medicare Other

## 2017-07-08 ENCOUNTER — Telehealth: Payer: Self-pay | Admitting: Oncology

## 2017-07-08 ENCOUNTER — Inpatient Hospital Stay: Payer: Medicare Other | Attending: Oncology

## 2017-07-08 ENCOUNTER — Ambulatory Visit: Payer: Medicare Other

## 2017-07-08 ENCOUNTER — Inpatient Hospital Stay: Payer: Medicare Other | Admitting: Oncology

## 2017-07-08 VITALS — BP 126/73 | HR 82 | Temp 97.5°F | Resp 19 | Ht 60.0 in | Wt 108.1 lb

## 2017-07-08 DIAGNOSIS — Z923 Personal history of irradiation: Secondary | ICD-10-CM

## 2017-07-08 DIAGNOSIS — C50912 Malignant neoplasm of unspecified site of left female breast: Secondary | ICD-10-CM

## 2017-07-08 DIAGNOSIS — Z9221 Personal history of antineoplastic chemotherapy: Secondary | ICD-10-CM

## 2017-07-08 DIAGNOSIS — Z79811 Long term (current) use of aromatase inhibitors: Secondary | ICD-10-CM

## 2017-07-08 DIAGNOSIS — I890 Lymphedema, not elsewhere classified: Secondary | ICD-10-CM

## 2017-07-08 DIAGNOSIS — Z170 Estrogen receptor positive status [ER+]: Secondary | ICD-10-CM | POA: Insufficient documentation

## 2017-07-08 DIAGNOSIS — Z853 Personal history of malignant neoplasm of breast: Secondary | ICD-10-CM

## 2017-07-08 DIAGNOSIS — M8580 Other specified disorders of bone density and structure, unspecified site: Secondary | ICD-10-CM

## 2017-07-08 DIAGNOSIS — I10 Essential (primary) hypertension: Secondary | ICD-10-CM | POA: Insufficient documentation

## 2017-07-08 DIAGNOSIS — G5603 Carpal tunnel syndrome, bilateral upper limbs: Secondary | ICD-10-CM | POA: Insufficient documentation

## 2017-07-08 LAB — BASIC METABOLIC PANEL
ANION GAP: 9 (ref 3–11)
BUN: 19 mg/dL (ref 7–26)
CO2: 30 mmol/L — ABNORMAL HIGH (ref 22–29)
Calcium: 10.1 mg/dL (ref 8.4–10.4)
Chloride: 100 mmol/L (ref 98–109)
Creatinine, Ser: 1.12 mg/dL — ABNORMAL HIGH (ref 0.60–1.10)
GFR calc Af Amer: 58 mL/min — ABNORMAL LOW (ref >60)
GFR, EST NON AFRICAN AMERICAN: 50 mL/min — AB (ref >60)
GLUCOSE: 85 mg/dL (ref 70–140)
POTASSIUM: 4 mmol/L (ref 3.3–4.7)
Sodium: 139 mmol/L (ref 136–145)

## 2017-07-08 NOTE — Progress Notes (Signed)
  [facility] OFFICE PROGRESS NOTE   Diagnosis: Breast cancer  INTERVAL HISTORY:   Shirley Melendez returns as scheduled.  She continues Arimidex.  Good appetite.  No change over the chest wall.  She continues follow-up with oral surgery for the tooth abnormality.  She reports occasional tightness of the TMJ.  No consistent tooth pain.  She wears a left arm lymphedema sleeve.  Objective:  Vital signs in last 24 hours:  Blood pressure 126/73, pulse 82, temperature (!) 97.5 F (36.4 C), temperature source Oral, resp. rate 19, height 5' (1.524 m), weight 108 lb 1.6 oz (49 kg), SpO2 100 %.    HEENT: Anterior gumline without breakdown Lymphatics: No cervical or supraclavicular nodes.?  Pea-sized node superior to the left axillary scar and in the mid right axilla Resp: Lungs clear bilaterally Cardio: Regular rate and rhythm GI: No hepatomegaly Vascular: No leg edema Breast: Status post left lumpectomy.  Firmness at the medial aspect of the lumpectomy scar, no evidence for local tumor recurrence.  No mass in either breast.   Medications: I have reviewed the patient's current medications.   Assessment/Plan:  1. Stage I (T1 NX) left-sided breast cancer diagnosed in April 1997. She was treated with a lumpectomy, left breast radiation, CMF chemotherapy, and 5 years of tamoxifen. 2. Metastatic carcinoma involving a left axillary lymph node, ER-positive, PR-positive, and HER-2-negative. She is status post a biopsy of a left axillary lymph node 08/13/2010. Status post a left axillary lymph node dissection 09/13/2010 with the pathology confirming 3/11 axillary lymph nodes containing metastatic carcinoma. A staging PET scan on 08/23/2010 confirmed a hypermetabolic left axillary lymph node and no other evidence of metastatic disease.  1. Status post 4 cycles of "adjuvant" T-C chemotherapy with cycle #4 given on 01/07/2011. 2. She began Arimidex on 03/26/2011 3. Left arm lymphedema,  likely lymphedema related to the left axillary lymph node dissection. She is now followed at the lymphedema clinic. Improved with physical therapy and a lymphedema sleeve. 4. Pain and numbness at the fingers bilaterally-right greater than left, she reports being diagnosed with carpal tunnel syndrome and underwent a right sided carpal tunnel surgery with clinical improvement  5. Osteopenia-she received Zometa in July of 2018.bone density scan May 2018 confirmed osteopenia at the femoral neck 6. Hypertension    Disposition: Shirley Melendez remains in clinical remission from breast cancer.  She will continue Arimidex.  She will schedule a mammogram for May 2019.  She will return for an office visit and Zometa in 6 months.  15 minutes were spent with the patient today.  The majority of the time was used for counseling and coordination of care.  Shirley Coder, MD  07/08/2017  11:19 AM

## 2017-07-08 NOTE — Telephone Encounter (Signed)
Gave patient avs and calendar with appts per 1/22 los.  °

## 2017-07-24 NOTE — Progress Notes (Signed)
Subjective:    Patient ID: Shirley Melendez, female    DOB: 11/15/1950, 67 y.o.   MRN: [id number]  HPI The patient is here for an acute visit.  Sciatica:  It started about two weeks ago.  She was having trouble with constipation - she took laxatives.  This pain was still there.  The pain is in the left buttock and it radiates down the left leg to the foot.  It is an aching and throbbing pain.  She denies numbness/tingling.  Her leg feels weak.  She denies lower back pain.   She denies any obvious injury or accident.   She denies fever/chills and changes in her bowels or bladder.   She has tried heat/ice and advil.  She also tries biofreeze.  They all help a little.  The pain is worse with walking and putting pressure on her left side when sitting.  She has been able to sleep.    She does have chronic back pain, but has never had radiating pain in the past.  She does follow with a chiropractor.  Medications and allergies reviewed with patient and updated if appropriate.  Patient Active Problem List   Diagnosis Date Noted  . Hyperglycemia 12/11/2016  . Essential hypertension, benign 07/16/2013  . Lymphedema of upper extremity following lymphadenectomy 11/07/2011  . Nonspecific abnormal electrocardiogram (ECG) (EKG) 09/27/2011  . Cervicalgia 04/12/2009  . BACK PAIN, LUMBAR 04/12/2009  . Hyperlipidemia 08/24/2008  . Osteoarthritis 08/24/2008  . Osteopenia 08/24/2008  . hx: breast cancer, left, invasive ductal carcinoma with axillary mets, receptor + 08/24/2008    Current Outpatient Medications on File Prior to Visit  Medication Sig Dispense Refill  . anastrozole (ARIMIDEX) 1 MG tablet TAKE ONE TABLET BY MOUTH DAILY 60 tablet 3  . aspirin 81 MG tablet Take 81 mg by mouth daily.      . Calcium Carbonate-Vitamin D (CALCIUM 600 + D PO) Take 1 capsule by mouth 2 (two) times daily. 600/400    . DULoxetine (CYMBALTA) 60 MG capsule Take 60 mg by mouth daily.    Marland Kitchen ibuprofen (ADVIL,MOTRIN)  200 MG tablet Take 200 mg by mouth every 6 (six) hours as needed.      Marland Kitchen LYSINE PO Take 500 mg by mouth as needed.      . metoprolol tartrate (LOPRESSOR) 50 MG tablet Take 1 tablet (50 mg total) by mouth 2 (two) times daily. 180 tablet 2  . Multiple Vitamins-Minerals (SENIOR MULTIVITAMIN PLUS PO) Take by mouth daily.      Marland Kitchen oxyCODONE-acetaminophen (PERCOCET) 5-325 MG per tablet Take 1 tablet by mouth daily as needed.    . Vitamin E 200 UNITS TABS Take 400 Units by mouth daily.     Marland Kitchen spironolactone (ALDACTONE) 25 MG tablet Take 1 tablet (25 mg total) by mouth once. 90 tablet 3   No current facility-administered medications on file prior to visit.     Past Medical History:  Diagnosis Date  . Arthritis    chronic neck and back pain  . Breast cancer ([place]) 1997   surgery, chemo, & radiation  . Breast cancer ([place]) 2012   surgery, chemo, & radiation with axillary dissection  . CTS (carpal tunnel syndrome) 2013   Dr [name] , Neurology  . Fasting hyperglycemia 2012   FBS 110  . Hyperlipidemia   . Hypertension   . 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 MRSA (methicillin resistant staph aureus) culture  positive 2012   swab was positive, treated per protocol.  No other issues.  . Osteopenia   . Personal history of chemotherapy 1997  . Personal history of radiation therapy 1997   x2  . PONV (postoperative nausea and vomiting) 1997   no problems since    Past Surgical History:  Procedure Laterality Date  . AXILLARY NODE DISSECTION  08/2010   left  . AXILLARY NODE DISSECTION Left 2012   with axillary dissection on left  . BREAST BIOPSY  2012   x2  . BREAST LUMPECTOMY  09/1995, 10/1995   left - lumpectomy   . BREAST LUMPECTOMY  2012   Lumpectomy Axilla   . BREAST SURGERY  2012    axillary node dissection left  . CARPAL TUNNEL RELEASE  10/29/2011   Procedure: CARPAL TUNNEL RELEASE;  Surgeon: Cammie Sickle., MD;  Location: [facility];  Service: Orthopedics;  Laterality: Right;  . COLONOSCOPY  2005   negative  . DILATION AND CURETTAGE, DIAGNOSTIC / THERAPEUTIC  2002  . KNEE SURGERY  2000   left  . TRIGGER FINGER RELEASE  10/29/2011   Procedure: RELEASE TRIGGER FINGER/A-1 PULLEY;  Surgeon: Cammie Sickle., MD;  Location: [facility];  Service: Orthopedics;  Laterality: Right;  right long and right index     Social History   Socioeconomic History  . Marital status: Married    Spouse name: Not on file  . Number of children: Not on file  . Years of education: Not on file  . Highest education level: Not on file  Social Needs  . Financial resource strain: Not on file  . Food insecurity - worry: Not on file  . Food insecurity - inability: Not on file  . Transportation needs - medical: Not on file  . Transportation needs - non-medical: Not on file  Occupational History  . Not on file  Tobacco Use  . Smoking status: Never Smoker  . Smokeless tobacco: Never Used  Substance and Sexual Activity  . Alcohol use: No    Alcohol/week: 0.0 oz  . Drug use: No  . Sexual activity: Not on file  Other Topics Concern  . Not on file  Social History Narrative   Retired Licensed conveyancer      Exercise: walking    Family History  Problem Relation Age of Onset  . Hypertension Mother   . Arthritis Mother        OA  . Arthritis Maternal Grandfather        OA  . Heart attack Maternal Grandfather 87  . Heart attack Maternal Uncle 65  . Stroke Neg Hx   . Diabetes Neg Hx   . Breast cancer Neg Hx     Review of Systems  Constitutional: Negative for chills and fever.  Gastrointestinal:       No incontinence  Genitourinary: Negative for difficulty urinating (No incontinence).  Musculoskeletal: Positive for back pain and gait problem.  Neurological: Positive for weakness (Left leg only: Related to increased pain with applying pressure on the leg). Negative for numbness.       Objective:    Vitals:   07/25/17 1004  BP: 124/74  Pulse: 84  Resp: 16  Temp: 98.5 F (36.9 C)  SpO2: 98%   Wt Readings from Last 3 Encounters:  07/25/17 108 lb (49 kg)  07/08/17 108 lb 1.6 oz (49 kg)  01/06/17 109 lb (49.4 kg)   Body mass index is 21.09  kg/m.   Physical Exam  Constitutional: She appears well-developed and well-nourished. No distress.  HENT:  Head: Normocephalic and atraumatic.  Musculoskeletal: She exhibits no edema.  No tenderness with palpation of the lumbar spine or lower back  Neurological:  Gait is normal, normal sensation and strength b/l lower extremities  Skin: Skin is warm and dry. She is not diaphoretic.           Assessment & Plan:    See Problem List for Assessment and Plan of chronic medical problems.

## 2017-07-25 ENCOUNTER — Ambulatory Visit: Payer: Medicare Other | Admitting: Internal Medicine

## 2017-07-25 VITALS — BP 124/74 | HR 84 | Temp 98.5°F | Resp 16 | Wt 108.0 lb

## 2017-07-25 DIAGNOSIS — M5432 Sciatica, left side: Secondary | ICD-10-CM

## 2017-07-25 MED ORDER — GABAPENTIN 100 MG PO CAPS: 200.0000 mg | ORAL_CAPSULE | Freq: Every day | ORAL | 3 refills | Status: DC

## 2017-07-25 MED ORDER — METHOCARBAMOL 500 MG PO TABS: 500.0000 mg | ORAL_TABLET | Freq: Three times a day (TID) | ORAL | 0 refills | Status: DC | PRN

## 2017-07-25 MED ORDER — MELOXICAM 15 MG PO TABS: 15.0000 mg | ORAL_TABLET | Freq: Every day | ORAL | 0 refills | Status: DC

## 2017-07-25 NOTE — Assessment & Plan Note (Signed)
Discussed possible causes Referral for physical therapy entered Can follow-up with her chiropractor Start meloxicam daily with food Start gabapentin at bedtime-start 200 mg at night-discussed that we can adjust Methocarbamol as needed  She will call if there is no improvement so we can adjust her medications

## 2017-07-25 NOTE — Patient Instructions (Addendum)
Medications reviewed and updated.  Changes include starting:  meloxicam (daily anti-inflammatory) - take this in the morning with food Gabapentin at bedtime - we can adjust the dose if needed Methocarbamol - muscle relaxer - take as needed.  Your prescription(s) have been submitted to your pharmacy. Please take as directed and contact our office if you believe you are having problem(s) with the medication(s).  A referral was ordered for physical therapy   Call or message me via mychart if no improvement     Herniated Disk Rehab Ask your health care provider which exercises are safe for you. Do exercises exactly as told by your health care provider and adjust them as directed. It is normal to feel mild stretching, pulling, tightness, or discomfort as you do these exercises, but you should stop right away if you feel sudden pain or your pain gets worse.Do not begin these exercises until told by your health care provider. Stretching and range of motion exercises These exercises warm up your muscles and joints and improve the movement and flexibility of your back. These exercises also help to relieve pain, numbness, and tingling. Exercise A: Prone extension on elbows  1. Lie on your abdomen on a firm surface. 2. Prop yourself up on your elbows. 3. Use your arms to help lift your chest up until you feel a gentle stretch in your abdomen and your lower back. ? This will place some of your body weight on your elbows. If this is uncomfortable, try stacking pillows under your chest. ? Your hips should stay down, against the surface that you are lying on. Keep your hip and back muscles relaxed. 4. Hold for __________ seconds. 5. Slowly relax your upper body and return to the starting position. Repeat __________ times. Complete this exercise __________ times a day. Exercise B: Standing extension 1. Stand with your feet shoulder width apart. 2. Place your hands on your lower back, with your palms  on your back. 3. Gently arch your back and press forward with your hands. 4. Hold for __________ seconds. 5. Slowly return to the starting position. Repeat __________ times. Complete this exercise__________ times a day. Strengthening exercises These exercises build strength and endurance in your back. Endurance is the ability to use your muscles for a long time, even after they get tired. Exercise C: Pelvic tilt 1. Lie on your back on a firm surface. Bend your knees and keep your feet flat. 2. Tense your abdominal muscles. Tip your pelvis up toward the ceiling and flatten your lower back into the floor. ? To help with this exercise, you may place a small towel under your lower back and try to push your back into the towel. 3. Hold for __________ seconds. 4. Let your muscles relax completely before you repeat this exercise. Repeat __________ times. Complete this exercise __________ times a day. Exercise D: Alternating arm and leg raises  1. Get on your hands and knees on a firm surface. If you are on a hard floor, you may want to use padding to cushion your knees, such as an exercise mat. 2. Line up your arms and legs. Your hands should be below your shoulders, and your knees should be below your hips. 3. Lift your left leg behind you. At the same time, raise your right arm and straighten it in front of you. ? Do not lift your leg higher than your hip. ? Do not lift your arm higher than your shoulder. ? Keep your abdominal and back  muscles tight. ? Keep your hips facing the ground. ? Do not arch your back. ? Keep your balance carefully, and do not hold your breath. 4. Hold for __________ seconds. 5. Slowly return to the starting position and repeat with your right leg and your left arm. Repeat __________ times. Complete this exercise __________ times a day. Exercise E: Bridge  1. Lie on your back on a firm surface with your knees bent and your feet flat on the floor. 2. Tighten your  abdominal and buttocks muscles and lift your bottom and abdomen off the floor until your trunk is level with your thighs. ? You should feel the muscles working in your buttocks and the back of your thighs. ? Do not arch your back. ? If this exercise is too easy, try doing it with your arms crossed over your chest. 3. Hold this position for __________ seconds. 4. Slowly lower your hips to the starting position. 5. Let your muscles relax completely between repetitions. Repeat __________ times. Complete this exercise __________ times a day. This information is not intended to replace advice given to you by your health care provider. Make sure you discuss any questions you have with your health care provider. Document Released: 06/03/2005 Document Revised: 02/08/2016 Document Reviewed: 03/21/2015 Elsevier Interactive Patient Education  2018 [employer].

## 2017-07-31 ENCOUNTER — Other Ambulatory Visit: Payer: Self-pay

## 2017-07-31 ENCOUNTER — Ambulatory Visit: Payer: Medicare Other | Attending: Internal Medicine | Admitting: Physical Therapy

## 2017-07-31 ENCOUNTER — Encounter: Payer: Self-pay | Admitting: Physical Therapy

## 2017-07-31 DIAGNOSIS — M6281 Muscle weakness (generalized): Secondary | ICD-10-CM | POA: Diagnosis present

## 2017-07-31 DIAGNOSIS — M62838 Other muscle spasm: Secondary | ICD-10-CM | POA: Diagnosis present

## 2017-07-31 DIAGNOSIS — M79605 Pain in left leg: Secondary | ICD-10-CM | POA: Diagnosis present

## 2017-07-31 NOTE — Therapy (Signed)
[facility] [address] [place], [place], [address] Phone: [phone]   Fax:  [phone]  Physical Therapy Evaluation  Patient Details  Name: Shirley Melendez MRN: [id number] Date of Birth: February 28, 1951 Referring Provider: Billey Gosling, MD    Encounter Date: 07/31/2017  PT End of Session - 07/31/17 1535    Visit Number  1    Number of Visits  16    Date for PT Re-Evaluation  09/25/17    Authorization Type  [employer] Medicare     Authorization Time Period  07/31/17 to 09/25/17    PT Start Time  1445    PT Stop Time  1530    PT Time Calculation (min)  45 min    Activity Tolerance  Patient tolerated treatment well;No increased pain    Behavior During Therapy  WFL for tasks assessed/performed       Past Medical History:  Diagnosis Date  . Arthritis    chronic neck and back pain  . Breast cancer ([place]) 1997   surgery, chemo, & radiation  . Breast cancer ([place]) 2012   surgery, chemo, & radiation with axillary dissection  . CTS (carpal tunnel syndrome) 2013   Dr [name] , Neurology  . Fasting hyperglycemia 2012   FBS 110  . Hyperlipidemia   . Hypertension   . 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 MRSA (methicillin resistant staph aureus) culture positive 2012   swab was positive, treated per protocol.  No other issues.  . Osteopenia   . Personal history of chemotherapy 1997  . Personal history of radiation therapy 1997   x2  . PONV (postoperative nausea and vomiting) 1997   no problems since    Past Surgical History:  Procedure Laterality Date  . AXILLARY NODE DISSECTION  08/2010   left  . AXILLARY NODE DISSECTION Left 2012   with axillary dissection on left  . BREAST BIOPSY  2012   x2  . BREAST LUMPECTOMY  09/1995, 10/1995   left - lumpectomy   . BREAST LUMPECTOMY  2012   Lumpectomy Axilla   . BREAST SURGERY  2012    axillary node dissection left  . CARPAL  TUNNEL RELEASE  10/29/2011   Procedure: CARPAL TUNNEL RELEASE;  Surgeon: Cammie Sickle., MD;  Location: [facility];  Service: Orthopedics;  Laterality: Right;  . COLONOSCOPY  2005   negative  . DILATION AND CURETTAGE, DIAGNOSTIC / THERAPEUTIC  2002  . KNEE SURGERY  2000   left  . TRIGGER FINGER RELEASE  10/29/2011   Procedure: RELEASE TRIGGER FINGER/A-1 PULLEY;  Surgeon: Cammie Sickle., MD;  Location: [facility];  Service: Orthopedics;  Laterality: Right;  right long and right index     There were no vitals filed for this visit.   Subjective Assessment - 07/31/17 1459    Subjective  Pt reports that approximately 3 weeks ago she got up to walk and she started to notice pain down the side and back of her leg. Since the onset, she noticed it was getting worse. It seemed to go across the lower back and hips at times as well. She would even notice this while she was sitting. She denies any numbness and tingling.     Pertinent History  HTN, osteopenia, axillary node disection, cancer    Limitations  Standing    Patient Stated Goals  decrease pain with standing  so that she can make meals    Currently in Pain?  Yes    Pain Score  5     Pain Location  Buttocks    Pain Orientation  Left    Pain Descriptors / Indicators  Aching;Throbbing;Dull    Pain Type  Acute pain    Pain Radiating Towards  none right now, occasional down the back of the leg to the knee (when walking alot)     Pain Onset  1 to 4 weeks ago    Pain Frequency  Intermittent    Aggravating Factors   standing, usually worse end of day    Pain Relieving Factors  heat and ice, sitting helps some     Effect of Pain on Daily Activities  issues with fixing a meal          [facility] PT Assessment - 07/31/17 0001      Assessment   Medical Diagnosis  Sciatica of Lt side     Referring Provider  Billey Gosling, MD     Onset Date/Surgical Date  -- ~3 weeks ago     Next MD Visit  none for now     Prior  Therapy  none       Precautions   Precautions  None      Balance Screen   Has the patient fallen in the past 6 months  No    Has the patient had a decrease in activity level because of a fear of falling?   No    Is the patient reluctant to leave their home because of a fear of falling?   No      Home Environment   Additional Comments  lives with husband, 3 STE the home       Prior Function   Level of Independence  Independent      Cognition   Overall Cognitive Status  Within Functional Limits for tasks assessed      Observation/Other Assessments   Observations  Lt lumbar scoliosis noted    Focus on Therapeutic Outcomes (FOTO)   53% limited       Sensation   Light Touch  Appears Intact      ROM / Strength   AROM / PROM / Strength  AROM;Strength      AROM   AROM Assessment Site  Lumbar    Lumbar Flexion  convexity mid lumbar spine Lt; pain free and no change in symptoms     Lumbar Extension  WNL, pain free, no change in symptoms       Strength   Strength Assessment Site  Hip;Knee;Ankle    Right/Left Hip  Right;Left    Right Hip Flexion  5/5    Right Hip Extension  5/5    Right Hip ABduction  4/5    Left Hip Flexion  4/5    Left Hip Extension  4/5    Left Hip ABduction  3/5    Right/Left Knee  Right;Left    Right Knee Flexion  5/5    Right Knee Extension  5/5    Left Knee Flexion  5/5    Left Knee Extension  5/5    Right/Left Ankle  Right;Left    Right Ankle Dorsiflexion  5/5    Left Ankle Dorsiflexion  5/5      Flexibility   Soft Tissue Assessment /Muscle Length  yes    Hamstrings  40 deg lacking BLE     Quadriceps  WNL  Piriformis  prone hip IR: 20 deg Lt, 30 deg Rt       Palpation   Spinal mobility  hypomobile lumbar/thoracic spine     Palpation comment  tenderness and palpable TrP Lt piriformis/glute max      Special Tests   Other special tests  Slump (-) Lt/Rt; passive SLR (-) Lt and Rt       Transfers   Five time sit to stand comments   10 sec,  weight shift Rt       Ambulation/Gait   Pre-Gait Activities  Ascend/descend steps with 1 handrail and Rt step to pattern x1 trial      High Level Balance   High Level Balance Comments  Single leg balance: Rt 8 sec, Lt unable (+) trendelenburg              Objective measurements completed on examination: See above findings.              PT Education - 07/31/17 1534    Education provided  Yes    Education Details  educated pt on differences between more proximal and distal causes of LE radicular symptoms; eval findings/POC    Person(s) Educated  Patient    Methods  Explanation    Comprehension  Verbalized understanding       PT Short Term Goals - 07/31/17 1546      PT SHORT TERM GOAL #1   Title  Pt will demo consistency and independence with her HEP to decrease pain and improve flexibility/strength of the LEs.     Time  4    Period  Weeks    Status  New    Target Date  08/28/17      PT SHORT TERM GOAL #2   Title  Pt will demo proper mechanics with sit to stand, without UE support or noted weight shift to the Rt, which will improve the functional strength of the LLE.     Time  4    Period  Weeks    Status  New      PT SHORT TERM GOAL #3   Title  Pt will report atleast 30% improvement in pain from the start of therapy, which will allow her to gradually increase her activity around the house.     Time  4    Period  Weeks        PT Long Term Goals - 07/31/17 1549      PT LONG TERM GOAL #1   Title  Pt will demo improved BLE strength to 5/5 MMT which will increased her efficiency with daily activity.     Time  8    Period  Weeks    Status  New    Target Date  09/25/17      PT LONG TERM GOAL #2   Title  Pt will demo improved Lt hip strength and stability, evident by her ability to maintain SLS for atleast 5 sec, 2/3 trials, without trendelenburg deviation or LOB.     Time  8    Period  Weeks    Status  New      PT LONG TERM GOAL #3   Title  Pt will  be able to stay up on her feet for atleast 30 min without the need for a rest break to allow her to fix a meal for her and her spouse.    Time  8    Period  Weeks  Status  New      PT LONG TERM GOAL #4   Title  Pt will report atleast 75% resolution of her symptoms with daily activity from the start of therapy.    Time  8    Period  Weeks    Status  New             Plan - 07/31/17 1537    Clinical Impression Statement  Pt is a pleasant 67 y.o F referred to OPPT with complaints of Lt buttock pain that occasionally travels down the posterior/lateral aspect of her thigh ot the knee. Pain was onset insidiously and is worse with standing and walking. Pt does have history of scoliosis and low back pain, however repeated movement testing did not change symptoms or pain. In addition to this, slump testing and passive straight leg raise were both negative. Pt does demonstrate lack of hip flexibility and limited hip strength on the Lt more notably than the Rt, with (+) Trendelenburg during single leg stance on the Lt which limits her balance to no more than 1-2 sec on that side. Pt would benefit from skilled PT to address her limitations in ROM, strength and flexibility to improve her activity participation and allow her to prepare meals and complete other recreation activity without difficulty.    History and Personal Factors relevant to plan of care:  cancer, osteopenia     Clinical Presentation  Evolving    Clinical Presentation due to:  initially worsened, but seems to be less irritable lately     Clinical Decision Making  Low    Rehab Potential  Good    PT Frequency  2x / week    PT Duration  8 weeks    PT Treatment/Interventions  ADLs/Self Care Home Management;Cryotherapy;Electrical Stimulation;Moist Heat;Traction;Iontophoresis 4mg /ml Dexamethasone;Gait training;Functional mobility training;Stair training;Therapeutic activities;Therapeutic exercise;Balance training;Patient/family  education;Neuromuscular re-education;Manual techniques;Taping;Dry needling;Passive range of motion    PT Next Visit Plan  possible DN glutes/piriformis; hip stretches: hamstring and piriformis; progress hip strengthening     PT Home Exercise Plan  next session    Consulted and Agree with Plan of Care  Patient       Patient will benefit from skilled therapeutic intervention in order to improve the following deficits and impairments:  Decreased activity tolerance, Decreased balance, Impaired flexibility, Hypomobility, Decreased strength, Decreased range of motion, Increased muscle spasms, Pain, Improper body mechanics  Visit Diagnosis: Pain in left leg  Muscle weakness (generalized)  Other muscle spasm     Problem List Patient Active Problem List   Diagnosis Date Noted  . Sciatica of left side 07/25/2017  . Hyperglycemia 12/11/2016  . Essential hypertension, benign 07/16/2013  . Lymphedema of upper extremity following lymphadenectomy 11/07/2011  . Nonspecific abnormal electrocardiogram (ECG) (EKG) 09/27/2011  . Cervicalgia 04/12/2009  . BACK PAIN, LUMBAR 04/12/2009  . Hyperlipidemia 08/24/2008  . Osteoarthritis 08/24/2008  . Osteopenia 08/24/2008  . hx: breast cancer, left, invasive ductal carcinoma with axillary mets, receptor + 08/24/2008    3:58 PM,07/31/17 Sherol Dade PT, DPT [facility] [address], [place], [place], [address] Phone: [phone]   Fax:  [phone]  Name: Shirley Melendez MRN: [id number] Date of Birth: Aug 31, 1950

## 2017-08-05 ENCOUNTER — Encounter: Payer: Self-pay | Admitting: Physical Therapy

## 2017-08-05 ENCOUNTER — Ambulatory Visit: Payer: Medicare Other | Admitting: Physical Therapy

## 2017-08-05 DIAGNOSIS — M79605 Pain in left leg: Secondary | ICD-10-CM

## 2017-08-05 DIAGNOSIS — M62838 Other muscle spasm: Secondary | ICD-10-CM

## 2017-08-05 DIAGNOSIS — M6281 Muscle weakness (generalized): Secondary | ICD-10-CM

## 2017-08-05 NOTE — Patient Instructions (Addendum)
    STRETCH - PIRIFORMIS  Start in a supine position, one leg bent with foot flat on the bed/floor, and the other leg crossed over the knee.  Gently push the knee of the crossed leg forward until a strong yet pain-free stretch is felt.  Hold for 30 seconds then release.  Repeat 2 sets, then switch sides and repeat.         supine hamstring stretch  -raise the right hip to 90 degrees and place both hands behind the lower part of the right knee/  -keep the elbow straight and actively straighten the right knee, feeling a stretch behind the leg. Hold for atleast 10 sec  -for additional stretch, bring toes towards your chest.    - repeat 5x each side.      HIP FLEXOR STRETCH 4  While lying on a table or high bed, let the affected leg lower towards the floor until a stretch is felt along the front of your thigh.    At the same time, slowly bend your affected knee to add more stretch and grasp your opposite knee and pull it towards your chest.    Hold atleast 30 sec and repeat atleast 2x on each side.      ELASTIC BAND - SIDELYING CLAM - CL[place]HELL   While lying on your side with your knees bent and an elastic band wrapped around your knees, draw up the top knee while keeping contact of your feet together as shown.   Do not let your pelvis roll back during the lifting movement.   x10 reps     [facility] [address], [place], [place] [address] Phone # [phone] Fax [phone]

## 2017-08-05 NOTE — Therapy (Signed)
[facility] [address] [place], [place], [address] Phone: [phone]   Fax:  [phone]  Physical Therapy Treatment  Patient Details  Name: Shirley Melendez MRN: [id number] Date of Birth: 03/13/1951 Referring Provider: Billey Gosling, MD    Encounter Date: 08/05/2017  PT End of Session - 08/05/17 1138    Visit Number  2    Number of Visits  16    Date for PT Re-Evaluation  09/25/17    Authorization Type  [employer] Medicare     Authorization Time Period  07/31/17 to 09/25/17    PT Start Time  1131    PT Stop Time  1215    PT Time Calculation (min)  44 min    Activity Tolerance  Patient tolerated treatment well;No increased pain    Behavior During Therapy  WFL for tasks assessed/performed       Past Medical History:  Diagnosis Date  . Arthritis    chronic neck and back pain  . Breast cancer ([place]) 1997   surgery, chemo, & radiation  . Breast cancer ([place]) 2012   surgery, chemo, & radiation with axillary dissection  . CTS (carpal tunnel syndrome) 2013   Dr [name] , Neurology  . Fasting hyperglycemia 2012   FBS 110  . Hyperlipidemia   . Hypertension   . 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 MRSA (methicillin resistant staph aureus) culture positive 2012   swab was positive, treated per protocol.  No other issues.  . Osteopenia   . Personal history of chemotherapy 1997  . Personal history of radiation therapy 1997   x2  . PONV (postoperative nausea and vomiting) 1997   no problems since    Past Surgical History:  Procedure Laterality Date  . AXILLARY NODE DISSECTION  08/2010   left  . AXILLARY NODE DISSECTION Left 2012   with axillary dissection on left  . BREAST BIOPSY  2012   x2  . BREAST LUMPECTOMY  09/1995, 10/1995   left - lumpectomy   . BREAST LUMPECTOMY  2012   Lumpectomy Axilla   . BREAST SURGERY  2012    axillary node dissection left  . CARPAL  TUNNEL RELEASE  10/29/2011   Procedure: CARPAL TUNNEL RELEASE;  Surgeon: Cammie Sickle., MD;  Location: [facility];  Service: Orthopedics;  Laterality: Right;  . COLONOSCOPY  2005   negative  . DILATION AND CURETTAGE, DIAGNOSTIC / THERAPEUTIC  2002  . KNEE SURGERY  2000   left  . TRIGGER FINGER RELEASE  10/29/2011   Procedure: RELEASE TRIGGER FINGER/A-1 PULLEY;  Surgeon: Cammie Sickle., MD;  Location: [facility];  Service: Orthopedics;  Laterality: Right;  right long and right index     There were no vitals filed for this visit.  Subjective Assessment - 08/05/17 1134    Subjective  Pt reports that she was having a pretty good day yesterday, but last night her pain increased pretty significantly. Currently, she has some pain.     Pertinent History  HTN, osteopenia, axillary node disection, cancer    Limitations  Standing    Patient Stated Goals  decrease pain with standing so that she can make meals    Currently in Pain?  Yes    Pain Score  7     Pain Location  Buttocks    Pain Orientation  Left    Pain Descriptors /  Indicators  Aching    Pain Type  Acute pain    Pain Radiating Towards  down the back of the thigh to the knee and occasionally to the foot    Pain Onset  1 to 4 weeks ago    Pain Frequency  Intermittent    Aggravating Factors   standing and end of the day    Pain Relieving Factors  heat    Effect of Pain on Daily Activities  issues standing                       [facility] Adult PT Treatment/Exercise - 08/05/17 0001      Exercises   Exercises  Lumbar      Lumbar Exercises: Stretches   Active Hamstring Stretch  5 reps;10 seconds;Left;Right    Active Hamstring Stretch Limitations  90/90 position     Hip Flexor Stretch  2 reps;Left;Right;30 seconds    Hip Flexor Stretch Limitations  supine     Piriformis Stretch  Left;Right;2 reps;30 seconds;Limitations    Piriformis Stretch Limitations  supine figure 4       Lumbar  Exercises: Standing   Other Standing Lumbar Exercises  repeated extension x10 reps, no change in pain       Lumbar Exercises: Supine   Other Supine Lumbar Exercises  Lt glute/piriformis massage with tennis ball       Lumbar Exercises: Sidelying   Clam  Both;10 reps    Clam Limitations  red TB      Manual Therapy   Manual Therapy  Soft tissue mobilization    Soft tissue mobilization  STM Lt vastus lateralis, Lt gluteals/piriformis              PT Education - 08/05/17 1215    Education provided  Yes    Education Details  implications for manual techniques; dry needling and benefits; HEP implemented    Person(s) Educated  Patient    Methods  Explanation;Verbal cues;Handout    Comprehension  Verbalized understanding;Returned demonstration       PT Short Term Goals - 07/31/17 1546      PT SHORT TERM GOAL #1   Title  Pt will demo consistency and independence with her HEP to decrease pain and improve flexibility/strength of the LEs.     Time  4    Period  Weeks    Status  New    Target Date  08/28/17      PT SHORT TERM GOAL #2   Title  Pt will demo proper mechanics with sit to stand, without UE support or noted weight shift to the Rt, which will improve the functional strength of the LLE.     Time  4    Period  Weeks    Status  New      PT SHORT TERM GOAL #3   Title  Pt will report atleast 30% improvement in pain from the start of therapy, which will allow her to gradually increase her activity around the house.     Time  4    Period  Weeks        PT Long Term Goals - 07/31/17 1549      PT LONG TERM GOAL #1   Title  Pt will demo improved BLE strength to 5/5 MMT which will increased her efficiency with daily activity.     Time  8    Period  Weeks    Status  New  Target Date  09/25/17      PT LONG TERM GOAL #2   Title  Pt will demo improved Lt hip strength and stability, evident by her ability to maintain SLS for atleast 5 sec, 2/3 trials, without  trendelenburg deviation or LOB.     Time  8    Period  Weeks    Status  New      PT LONG TERM GOAL #3   Title  Pt will be able to stay up on her feet for atleast 30 min without the need for a rest break to allow her to fix a meal for her and her spouse.    Time  8    Period  Weeks    Status  New      PT LONG TERM GOAL #4   Title  Pt will report atleast 75% resolution of her symptoms with daily activity from the start of therapy.    Time  8    Period  Weeks    Status  New            Plan - 08/05/17 1215    Clinical Impression Statement  Pt arrived with increased pain along the Lt buttock region this morning. Session focused on implementing her HEP to increase LE flexibility and strength. Pt demonstrated good understanding after cues were provided by the therapist. Manual techniques were completed to decrease muscle spasm and trigger points noted along the Lt gluteal/quadriceps region and pt was instructed in self massage at home with a tennis ball. She demonstrated good understanding of this and reported overall decrease in pain compared to when she arrived. Discussed possible dry needling in the future to further address muscle restrictions if needed.    Rehab Potential  Good    PT Frequency  2x / week    PT Duration  8 weeks    PT Treatment/Interventions  ADLs/Self Care Home Management;Cryotherapy;Electrical Stimulation;Moist Heat;Traction;Iontophoresis 4mg /ml Dexamethasone;Gait training;Functional mobility training;Stair training;Therapeutic activities;Therapeutic exercise;Balance training;Patient/family education;Neuromuscular re-education;Manual techniques;Taping;Dry needling;Passive range of motion    PT Next Visit Plan  possible DN glutes/piriformis; f/u on tennis ball massage; trial prone pressups for symptom response; progress hip strengthening     PT Home Exercise Plan  supine hip flexor stretch, hamstring stretch (90/90), supine piriformis stretch, side clams (red)     Recommended Other Services  Orders signed    Consulted and Agree with Plan of Care  Patient       Patient will benefit from skilled therapeutic intervention in order to improve the following deficits and impairments:  Decreased activity tolerance, Decreased balance, Impaired flexibility, Hypomobility, Decreased strength, Decreased range of motion, Increased muscle spasms, Pain, Improper body mechanics  Visit Diagnosis: Pain in left leg  Muscle weakness (generalized)  Other muscle spasm     Problem List Patient Active Problem List   Diagnosis Date Noted  . Sciatica of left side 07/25/2017  . Hyperglycemia 12/11/2016  . Essential hypertension, benign 07/16/2013  . Lymphedema of upper extremity following lymphadenectomy 11/07/2011  . Nonspecific abnormal electrocardiogram (ECG) (EKG) 09/27/2011  . Cervicalgia 04/12/2009  . BACK PAIN, LUMBAR 04/12/2009  . Hyperlipidemia 08/24/2008  . Osteoarthritis 08/24/2008  . Osteopenia 08/24/2008  . hx: breast cancer, left, invasive ductal carcinoma with axillary mets, receptor + 08/24/2008    12:23 PM,08/05/17 Sherol Dade PT, DPT [facility] [address], [place], [place], [address] Phone: [phone]  Fax:  [phone]  Name: RONDELL FRICK MRN: [id number] Date of Birth: Nov 29, 1950

## 2017-08-07 ENCOUNTER — Encounter: Payer: Medicare Other | Admitting: Physical Therapy

## 2017-08-12 ENCOUNTER — Ambulatory Visit: Payer: Medicare Other | Admitting: Physical Therapy

## 2017-08-12 ENCOUNTER — Encounter: Payer: Self-pay | Admitting: Physical Therapy

## 2017-08-12 DIAGNOSIS — M6281 Muscle weakness (generalized): Secondary | ICD-10-CM

## 2017-08-12 DIAGNOSIS — M62838 Other muscle spasm: Secondary | ICD-10-CM

## 2017-08-12 DIAGNOSIS — M79605 Pain in left leg: Secondary | ICD-10-CM

## 2017-08-12 NOTE — Therapy (Signed)
[facility] [address] [place], [place], [address] Phone: [phone]   Fax:  [phone]  Physical Therapy Treatment  Patient Details  Name: Shirley Melendez MRN: [id number] Date of Birth: Jun 06, 1951 Referring Provider: Billey Gosling, MD    Encounter Date: 08/12/2017  PT End of Session - 08/12/17 1518    Visit Number  3    Number of Visits  16    Date for PT Re-Evaluation  09/25/17    Authorization Type  [employer] Medicare     Authorization Time Period  07/31/17 to 09/25/17    PT Start Time  1447    PT Stop Time  1527    PT Time Calculation (min)  40 min    Activity Tolerance  Patient tolerated treatment well;No increased pain    Behavior During Therapy  WFL for tasks assessed/performed       Past Medical History:  Diagnosis Date  . Arthritis    chronic neck and back pain  . Breast cancer ([place]) 1997   surgery, chemo, & radiation  . Breast cancer ([place]) 2012   surgery, chemo, & radiation with axillary dissection  . CTS (carpal tunnel syndrome) 2013   Dr [name] , Neurology  . Fasting hyperglycemia 2012   FBS 110  . Hyperlipidemia   . Hypertension   . 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 MRSA (methicillin resistant staph aureus) culture positive 2012   swab was positive, treated per protocol.  No other issues.  . Osteopenia   . Personal history of chemotherapy 1997  . Personal history of radiation therapy 1997   x2  . PONV (postoperative nausea and vomiting) 1997   no problems since    Past Surgical History:  Procedure Laterality Date  . AXILLARY NODE DISSECTION  08/2010   left  . AXILLARY NODE DISSECTION Left 2012   with axillary dissection on left  . BREAST BIOPSY  2012   x2  . BREAST LUMPECTOMY  09/1995, 10/1995   left - lumpectomy   . BREAST LUMPECTOMY  2012   Lumpectomy Axilla   . BREAST SURGERY  2012    axillary node dissection left  . CARPAL  TUNNEL RELEASE  10/29/2011   Procedure: CARPAL TUNNEL RELEASE;  Surgeon: Cammie Sickle., MD;  Location: [facility];  Service: Orthopedics;  Laterality: Right;  . COLONOSCOPY  2005   negative  . DILATION AND CURETTAGE, DIAGNOSTIC / THERAPEUTIC  2002  . KNEE SURGERY  2000   left  . TRIGGER FINGER RELEASE  10/29/2011   Procedure: RELEASE TRIGGER FINGER/A-1 PULLEY;  Surgeon: Cammie Sickle., MD;  Location: [facility];  Service: Orthopedics;  Laterality: Right;  right long and right index     There were no vitals filed for this visit.  Subjective Assessment - 08/12/17 1450    Subjective  Pt reports that she feels much improved overall since the start of PT. She thinks it is atleast 30-40% improved overall. She has been completing her HEP.    Pertinent History  HTN, osteopenia, axillary node disection, cancer    Limitations  Standing    Patient Stated Goals  decrease pain with standing so that she can make meals    Currently in Pain?  Yes    Pain Score  6     Pain Location  Other (Comment) Rt shin    Pain Orientation  Left  Pain Descriptors / Indicators  Aching    Pain Type  Acute pain    Pain Radiating Towards  just in the shin area    Pain Onset  More than a month ago    Pain Frequency  Intermittent    Aggravating Factors   unsure, usually by the end of the day    Pain Relieving Factors  unsure     Effect of Pain on Daily Activities  standing too long            [facility] Adult PT Treatment/Exercise - 08/12/17 0001      Lumbar Exercises: Standing   Other Standing Lumbar Exercises  hip extension (knee straight) x15 reps each, forearms resting on countertop PT cuing to decrease trunk rotation      Lumbar Exercises: Seated   Sit to Stand  10 reps;Limitations    Sit to Stand Limitations  no UE support, 2nd set on foam pad       Lumbar Exercises: Supine   Bridge  15 reps      Lumbar Exercises: Sidelying   Hip Abduction  Both;10  reps;Limitations    Hip Abduction Limitations  x2 sets each       Manual Therapy   Manual Therapy  Myofascial release    Soft tissue mobilization  STM Lt vastus lateralis, Lt gluteals/piriformis     Myofascial Release  TPR Lt glute med             PT Education - 08/12/17 1525    Education provided  Yes    Education Details  technique with therex    Person(s) Educated  Patient    Methods  Explanation;Verbal cues    Comprehension  Verbalized understanding;Returned demonstration       PT Short Term Goals - 08/12/17 1531      PT SHORT TERM GOAL #1   Title  Pt will demo consistency and independence with her HEP to decrease pain and improve flexibility/strength of the LEs.     Time  4    Period  Weeks    Status  Achieved      PT SHORT TERM GOAL #2   Title  Pt will demo proper mechanics with sit to stand, without UE support or noted weight shift to the Rt, which will improve the functional strength of the LLE.     Time  4    Period  Weeks    Status  Achieved      PT SHORT TERM GOAL #3   Title  Pt will report atleast 30% improvement in pain from the start of therapy, which will allow her to gradually increase her activity around the house.     Baseline  30-40%     Time  4    Period  Weeks    Status  Achieved        PT Long Term Goals - 07/31/17 1549      PT LONG TERM GOAL #1   Title  Pt will demo improved BLE strength to 5/5 MMT which will increased her efficiency with daily activity.     Time  8    Period  Weeks    Status  New    Target Date  09/25/17      PT LONG TERM GOAL #2   Title  Pt will demo improved Lt hip strength and stability, evident by her ability to maintain SLS for atleast 5 sec, 2/3 trials, without trendelenburg deviation or LOB.  Time  8    Period  Weeks    Status  New      PT LONG TERM GOAL #3   Title  Pt will be able to stay up on her feet for atleast 30 min without the need for a rest break to allow her to fix a meal for her and her  spouse.    Time  8    Period  Weeks    Status  New      PT LONG TERM GOAL #4   Title  Pt will report atleast 75% resolution of her symptoms with daily activity from the start of therapy.    Time  8    Period  Weeks    Status  New            Plan - 08/12/17 1527    Clinical Impression Statement  Pt arrived today reporting atleast 30%-40% improvement overall since beginning PT. She does have dull ache in the Lt lateral shin area which she notes was improved following manual techniques/MFR to the area. Pt was able to progress her exercises with increased reps and resistance, although she did require additional cuing to decrease compensations noted during hip strengthening. Ended session with less pain than she arrived, but pt was unable to provide a pain score. Will continue with current POC.    Rehab Potential  Good    PT Frequency  2x / week    PT Duration  8 weeks    PT Treatment/Interventions  ADLs/Self Care Home Management;Cryotherapy;Electrical Stimulation;Moist Heat;Traction;Iontophoresis 4mg /ml Dexamethasone;Gait training;Functional mobility training;Stair training;Therapeutic activities;Therapeutic exercise;Balance training;Patient/family education;Neuromuscular re-education;Manual techniques;Taping;Dry needling;Passive range of motion    PT Next Visit Plan  possible DN glutes/piriformis; update HEP; trunk stability; progress hip strengthening     PT Home Exercise Plan  supine hip flexor stretch, hamstring stretch (90/90), supine piriformis stretch, side clams (red)    Consulted and Agree with Plan of Care  Patient       Patient will benefit from skilled therapeutic intervention in order to improve the following deficits and impairments:  Decreased activity tolerance, Decreased balance, Impaired flexibility, Hypomobility, Decreased strength, Decreased range of motion, Increased muscle spasms, Pain, Improper body mechanics  Visit Diagnosis: Pain in left leg  Muscle weakness  (generalized)  Other muscle spasm     Problem List Patient Active Problem List   Diagnosis Date Noted  . Sciatica of left side 07/25/2017  . Hyperglycemia 12/11/2016  . Essential hypertension, benign 07/16/2013  . Lymphedema of upper extremity following lymphadenectomy 11/07/2011  . Nonspecific abnormal electrocardiogram (ECG) (EKG) 09/27/2011  . Cervicalgia 04/12/2009  . BACK PAIN, LUMBAR 04/12/2009  . Hyperlipidemia 08/24/2008  . Osteoarthritis 08/24/2008  . Osteopenia 08/24/2008  . hx: breast cancer, left, invasive ductal carcinoma with axillary mets, receptor + 08/24/2008    3:44 PM,08/12/17 Sherol Dade PT, DPT [facility] [address] [place], [place], [address] Phone: [phone]   Fax:  [phone]  Name: SOFIJA ANTWI MRN: [id number] Date of Birth: 11/09/1950

## 2017-08-14 ENCOUNTER — Ambulatory Visit: Payer: Medicare Other | Admitting: Physical Therapy

## 2017-08-14 ENCOUNTER — Encounter: Payer: Self-pay | Admitting: Physical Therapy

## 2017-08-14 DIAGNOSIS — M6281 Muscle weakness (generalized): Secondary | ICD-10-CM

## 2017-08-14 DIAGNOSIS — M79605 Pain in left leg: Secondary | ICD-10-CM

## 2017-08-14 DIAGNOSIS — M62838 Other muscle spasm: Secondary | ICD-10-CM

## 2017-08-14 NOTE — Therapy (Signed)
[facility] [address], [place], [place], [address] Phone: [phone]   Fax:  [phone]  Physical Therapy Treatment  Patient Details  Name: Shirley Melendez MRN: [id number] Date of Birth: Nov 10, 1950 Referring Provider: Billey Gosling, MD    Encounter Date: 08/14/2017  PT End of Session - 08/14/17 1501    Visit Number  4    Number of Visits  16    Date for PT Re-Evaluation  09/25/17    Authorization Type  [employer] Medicare     Authorization Time Period  07/31/17 to 09/25/17    PT Start Time  1447    PT Stop Time  1525    PT Time Calculation (min)  38 min    Activity Tolerance  Patient tolerated treatment well;No increased pain    Behavior During Therapy  WFL for tasks assessed/performed       Past Medical History:  Diagnosis Date  . Arthritis    chronic neck and back pain  . Breast cancer ([place]) 1997   surgery, chemo, & radiation  . Breast cancer ([place]) 2012   surgery, chemo, & radiation with axillary dissection  . CTS (carpal tunnel syndrome) 2013   Dr [name] , Neurology  . Fasting hyperglycemia 2012   FBS 110  . Hyperlipidemia   . Hypertension   . 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 MRSA (methicillin resistant staph aureus) culture positive 2012   swab was positive, treated per protocol.  No other issues.  . Osteopenia   . Personal history of chemotherapy 1997  . Personal history of radiation therapy 1997   x2  . PONV (postoperative nausea and vomiting) 1997   no problems since    Past Surgical History:  Procedure Laterality Date  . AXILLARY NODE DISSECTION  08/2010   left  . AXILLARY NODE DISSECTION Left 2012   with axillary dissection on left  . BREAST BIOPSY  2012   x2  . BREAST LUMPECTOMY  09/1995, 10/1995   left - lumpectomy   . BREAST LUMPECTOMY  2012   Lumpectomy Axilla   . BREAST SURGERY  2012    axillary node dissection left  . CARPAL  TUNNEL RELEASE  10/29/2011   Procedure: CARPAL TUNNEL RELEASE;  Surgeon: Cammie Sickle., MD;  Location: [facility];  Service: Orthopedics;  Laterality: Right;  . COLONOSCOPY  2005   negative  . DILATION AND CURETTAGE, DIAGNOSTIC / THERAPEUTIC  2002  . KNEE SURGERY  2000   left  . TRIGGER FINGER RELEASE  10/29/2011   Procedure: RELEASE TRIGGER FINGER/A-1 PULLEY;  Surgeon: Cammie Sickle., MD;  Location: [facility];  Service: Orthopedics;  Laterality: Right;  right long and right index     There were no vitals filed for this visit.  Subjective Assessment - 08/14/17 1450    Subjective  Pt reports that things are going well. She was a little sore after her last session, but overall it seems to be improving. She continues to complete her HEP.    Pertinent History  HTN, osteopenia, axillary node disection, cancer    Limitations  Standing    Patient Stated Goals  decrease pain with standing so that she can make meals    Currently in Pain?  No/denies    Pain Onset  More than a month ago  [facility] PT Treatment/Exercise - 08/14/17 0001      Lumbar Exercises: Aerobic   Nustep  x6 min intervals L2 to L5 every minute, PT present to discuss progress      Lumbar Exercises: Standing   Other Standing Lumbar Exercises  hip extension (knee straight) 2x10 reps each with 2# ankle weight, forearms resting on countertop    Other Standing Lumbar Exercises  side stepping with red TB around feet, BUE support on countertop x2 trials down/back      Lumbar Exercises: Supine   Ab Set  10 reps;3 seconds    Bent Knee Raise  15 reps    Isometric Hip Flexion  15 reps;Limitations    Isometric Hip Flexion Limitations  oblique isometric      Lumbar Exercises: Sidelying   Hip Abduction  Both;10 reps;Weights    Hip Abduction Weights (lbs)  2    Hip Abduction Limitations  x2 sets each              PT Education - 08/14/17 1459     Education provided  Yes    Education Details  proper abdominal activation and importance of this    Person(s) Educated  Patient    Methods  Explanation;Verbal cues;Tactile cues    Comprehension  Verbalized understanding;Returned demonstration       PT Short Term Goals - 08/12/17 1531      PT SHORT TERM GOAL #1   Title  Pt will demo consistency and independence with her HEP to decrease pain and improve flexibility/strength of the LEs.     Time  4    Period  Weeks    Status  Achieved      PT SHORT TERM GOAL #2   Title  Pt will demo proper mechanics with sit to stand, without UE support or noted weight shift to the Rt, which will improve the functional strength of the LLE.     Time  4    Period  Weeks    Status  Achieved      PT SHORT TERM GOAL #3   Title  Pt will report atleast 30% improvement in pain from the start of therapy, which will allow her to gradually increase her activity around the house.     Baseline  30-40%     Time  4    Period  Weeks    Status  Achieved        PT Long Term Goals - 07/31/17 1549      PT LONG TERM GOAL #1   Title  Pt will demo improved BLE strength to 5/5 MMT which will increased her efficiency with daily activity.     Time  8    Period  Weeks    Status  New    Target Date  09/25/17      PT LONG TERM GOAL #2   Title  Pt will demo improved Lt hip strength and stability, evident by her ability to maintain SLS for atleast 5 sec, 2/3 trials, without trendelenburg deviation or LOB.     Time  8    Period  Weeks    Status  New      PT LONG TERM GOAL #3   Title  Pt will be able to stay up on her feet for atleast 30 min without the need for a rest break to allow her to fix a meal for her and her spouse.    Time  8  Period  Weeks    Status  New      PT LONG TERM GOAL #4   Title  Pt will report atleast 75% resolution of her symptoms with daily activity from the start of therapy.    Time  8    Period  Weeks    Status  New             Plan - 08/14/17 1522    Clinical Impression Statement  Pt continues to report improvements in pain, noting no symptoms upon arrival to her session today. Focused initially on improving deep abdominal activation and strength, pt was able to demonstrate good understanding after therapist cuing was provided. Also progressed LE strengthening with addition of resistance and increase in reps this session. Ended session without report of increase in pain or other symptoms.    Rehab Potential  Good    PT Frequency  2x / week    PT Duration  8 weeks    PT Treatment/Interventions  ADLs/Self Care Home Management;Cryotherapy;Electrical Stimulation;Moist Heat;Traction;Iontophoresis 4mg /ml Dexamethasone;Gait training;Functional mobility training;Stair training;Therapeutic activities;Therapeutic exercise;Balance training;Patient/family education;Neuromuscular re-education;Manual techniques;Taping;Dry needling;Passive range of motion    PT Next Visit Plan  update HEP (add abdominal bracing); possible DN glutes/piriformis if needed; trunk stability; progress hip strengthening in standing    PT Home Exercise Plan  supine hip flexor stretch, hamstring stretch (90/90), supine piriformis stretch, side clams (red)    Consulted and Agree with Plan of Care  Patient       Patient will benefit from skilled therapeutic intervention in order to improve the following deficits and impairments:  Decreased activity tolerance, Decreased balance, Impaired flexibility, Hypomobility, Decreased strength, Decreased range of motion, Increased muscle spasms, Pain, Improper body mechanics  Visit Diagnosis: Pain in left leg  Muscle weakness (generalized)  Other muscle spasm     Problem List Patient Active Problem List   Diagnosis Date Noted  . Sciatica of left side 07/25/2017  . Hyperglycemia 12/11/2016  . Essential hypertension, benign 07/16/2013  . Lymphedema of upper extremity following lymphadenectomy  11/07/2011  . Nonspecific abnormal electrocardiogram (ECG) (EKG) 09/27/2011  . Cervicalgia 04/12/2009  . BACK PAIN, LUMBAR 04/12/2009  . Hyperlipidemia 08/24/2008  . Osteoarthritis 08/24/2008  . Osteopenia 08/24/2008  . hx: breast cancer, left, invasive ductal carcinoma with axillary mets, receptor + 08/24/2008    3:28 PM,08/14/17 Sherol Dade PT, DPT [facility] [address] [place], [place], [address] Phone: [phone]   Fax:  [phone]  Name: Shirley Melendez MRN: [id number] Date of Birth: [birth year]

## 2017-08-19 ENCOUNTER — Encounter: Payer: Self-pay | Admitting: Physical Therapy

## 2017-08-19 ENCOUNTER — Ambulatory Visit: Payer: Medicare Other | Attending: Internal Medicine | Admitting: Physical Therapy

## 2017-08-19 DIAGNOSIS — M62838 Other muscle spasm: Secondary | ICD-10-CM | POA: Insufficient documentation

## 2017-08-19 DIAGNOSIS — M6281 Muscle weakness (generalized): Secondary | ICD-10-CM

## 2017-08-19 DIAGNOSIS — M79605 Pain in left leg: Secondary | ICD-10-CM | POA: Diagnosis present

## 2017-08-19 NOTE — Therapy (Signed)
[facility] [address], [place], [place], [address] Phone: [phone]   Fax:  [phone]  Physical Therapy Treatment  Patient Details  Name: Shirley Melendez MRN: [id number] Date of Birth: 1951/03/20 Referring Provider: Billey Gosling, MD    Encounter Date: 08/19/2017  PT End of Session - 08/19/17 1522    Visit Number  5    Number of Visits  16    Date for PT Re-Evaluation  09/25/17    Authorization Type  [employer] Medicare     Authorization Time Period  07/31/17 to 09/25/17    PT Start Time  1446    PT Stop Time  1528    PT Time Calculation (min)  42 min    Activity Tolerance  Patient tolerated treatment well;No increased pain    Behavior During Therapy  WFL for tasks assessed/performed       Past Medical History:  Diagnosis Date  . Arthritis    chronic neck and back pain  . Breast cancer ([place]) 1997   surgery, chemo, & radiation  . Breast cancer ([place]) 2012   surgery, chemo, & radiation with axillary dissection  . CTS (carpal tunnel syndrome) 2013   Dr [name] , Neurology  . Fasting hyperglycemia 2012   FBS 110  . Hyperlipidemia   . Hypertension   . 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 MRSA (methicillin resistant staph aureus) culture positive 2012   swab was positive, treated per protocol.  No other issues.  . Osteopenia   . Personal history of chemotherapy 1997  . Personal history of radiation therapy 1997   x2  . PONV (postoperative nausea and vomiting) 1997   no problems since    Past Surgical History:  Procedure Laterality Date  . AXILLARY NODE DISSECTION  08/2010   left  . AXILLARY NODE DISSECTION Left 2012   with axillary dissection on left  . BREAST BIOPSY  2012   x2  . BREAST LUMPECTOMY  09/1995, 10/1995   left - lumpectomy   . BREAST LUMPECTOMY  2012   Lumpectomy Axilla   . BREAST SURGERY  2012    axillary node dissection left  . CARPAL TUNNEL  RELEASE  10/29/2011   Procedure: CARPAL TUNNEL RELEASE;  Surgeon: Cammie Sickle., MD;  Location: [facility];  Service: Orthopedics;  Laterality: Right;  . COLONOSCOPY  2005   negative  . DILATION AND CURETTAGE, DIAGNOSTIC / THERAPEUTIC  2002  . KNEE SURGERY  2000   left  . TRIGGER FINGER RELEASE  10/29/2011   Procedure: RELEASE TRIGGER FINGER/A-1 PULLEY;  Surgeon: Cammie Sickle., MD;  Location: [facility];  Service: Orthopedics;  Laterality: Right;  right long and right index     There were no vitals filed for this visit.  Subjective Assessment - 08/19/17 1447    Subjective  Pt reports things continue to go well. She is working on her HEP. No pain currently.     Pertinent History  HTN, osteopenia, axillary node disection, cancer    Limitations  Standing    Patient Stated Goals  decrease pain with standing so that she can make meals    Currently in Pain?  No/denies    Pain Onset  More than a month ago                      [facility] Adult PT  Treatment/Exercise - 08/19/17 0001      Lumbar Exercises: Aerobic   Nustep  L4 x5 min, therapist present to discuss progress and plan of session      Lumbar Exercises: Standing   Heel Raises  10 reps;Limitations    Heel Raises Limitations  single leg, x2 sets each     Other Standing Lumbar Exercises  hip extension with yellow TB around ankle, knee straight 2x10 reps each side. Therapist cuing to decrease trunk flexion    Other Standing Lumbar Exercises  side stepping with red TB around feet, BUE support on countertop x3 trials down/back; 6" step ups with contralateral knee drive (1 UE support) x15 reps each side      Lumbar Exercises: Seated   Hip Flexion on Ball  Left;Right;10 reps    Other Seated Lumbar Exercises  seated alternating knee extension x10 reps each       Lumbar Exercises: Supine   Straight Leg Raise  10 reps;Limitations    Straight Leg Raises Limitations  abdominal bracing      Isometric Hip Flexion  10 reps    Isometric Hip Flexion Limitations  oblique isometric    Other Supine Lumbar Exercises  bent knee 90/90 with alternating heel tap x10 reps              PT Education - 08/19/17 1525    Education provided  Yes    Education Details  updates to HEP; technique with therex    Person(s) Educated  Patient    Methods  Explanation;Verbal cues;Handout    Comprehension  Returned demonstration;Verbalized understanding       PT Short Term Goals - 08/12/17 1531      PT SHORT TERM GOAL #1   Title  Pt will demo consistency and independence with her HEP to decrease pain and improve flexibility/strength of the LEs.     Time  4    Period  Weeks    Status  Achieved      PT SHORT TERM GOAL #2   Title  Pt will demo proper mechanics with sit to stand, without UE support or noted weight shift to the Rt, which will improve the functional strength of the LLE.     Time  4    Period  Weeks    Status  Achieved      PT SHORT TERM GOAL #3   Title  Pt will report atleast 30% improvement in pain from the start of therapy, which will allow her to gradually increase her activity around the house.     Baseline  30-40%     Time  4    Period  Weeks    Status  Achieved        PT Long Term Goals - 07/31/17 1549      PT LONG TERM GOAL #1   Title  Pt will demo improved BLE strength to 5/5 MMT which will increased her efficiency with daily activity.     Time  8    Period  Weeks    Status  New    Target Date  09/25/17      PT LONG TERM GOAL #2   Title  Pt will demo improved Lt hip strength and stability, evident by her ability to maintain SLS for atleast 5 sec, 2/3 trials, without trendelenburg deviation or LOB.     Time  8    Period  Weeks    Status  New  PT LONG TERM GOAL #3   Title  Pt will be able to stay up on her feet for atleast 30 min without the need for a rest break to allow her to fix a meal for her and her spouse.    Time  8    Period  Weeks     Status  New      PT LONG TERM GOAL #4   Title  Pt will report atleast 75% resolution of her symptoms with daily activity from the start of therapy.    Time  8    Period  Weeks    Status  New            Plan - 08/19/17 1528    Clinical Impression Statement  Pt continues to report minimal pain, and consistent HEP adherence. Session focused on progressing LE and trunk strength. Although pt's technique with supine exercises has improved, she does have increased difficulty maintaining proper posture and deep abdominal activation with seated exercises attempted this session. Will continue to address this in future sessions. Ended without any report of increased pain and pt demonstrated good understanding of all HEP additions.     Rehab Potential  Good    PT Frequency  2x / week    PT Duration  8 weeks    PT Treatment/Interventions  ADLs/Self Care Home Management;Cryotherapy;Electrical Stimulation;Moist Heat;Traction;Iontophoresis 4mg /ml Dexamethasone;Gait training;Functional mobility training;Stair training;Therapeutic activities;Therapeutic exercise;Balance training;Patient/family education;Neuromuscular re-education;Manual techniques;Taping;Dry needling;Passive range of motion    PT Next Visit Plan  progress trunk strength/stability in sitting positions; progress hip strengthening in standing    PT Home Exercise Plan  supine alt bent knee lower, supine SLR, supine oblique isometric, standing hip extension with yellow band     Consulted and Agree with Plan of Care  Patient       Patient will benefit from skilled therapeutic intervention in order to improve the following deficits and impairments:  Decreased activity tolerance, Decreased balance, Impaired flexibility, Hypomobility, Decreased strength, Decreased range of motion, Increased muscle spasms, Pain, Improper body mechanics  Visit Diagnosis: Pain in left leg  Muscle weakness (generalized)  Other muscle spasm     Problem  List Patient Active Problem List   Diagnosis Date Noted  . Sciatica of left side 07/25/2017  . Hyperglycemia 12/11/2016  . Essential hypertension, benign 07/16/2013  . Lymphedema of upper extremity following lymphadenectomy 11/07/2011  . Nonspecific abnormal electrocardiogram (ECG) (EKG) 09/27/2011  . Cervicalgia 04/12/2009  . BACK PAIN, LUMBAR 04/12/2009  . Hyperlipidemia 08/24/2008  . Osteoarthritis 08/24/2008  . Osteopenia 08/24/2008  . hx: breast cancer, left, invasive ductal carcinoma with axillary mets, receptor + 08/24/2008    3:37 PM,08/19/17 Sherol Dade PT, DPT [facility] [address] [place], [place], [address] Phone: [phone]   Fax:  [phone]  Name: Shirley Melendez MRN: [id number] Date of Birth: 08-01-1950

## 2017-08-19 NOTE — Patient Instructions (Addendum)
   Isometric Oblique Stabilization  From a supine position with both knees bent, bring one knee up to 90 degrees. Resist the lifted knee with the opposite arm to contract the obliques while keeping the head and shoulders on the mat. There should be no movement of the knee during the oblique contraction. Hold 3 sec, repeat 15x each side.      STRAIGHT LEG RAISE - SLR  While lying on your back, raise up your leg with a straight knee.  Keep the opposite knee bent with the foot planted on the ground.  Keep abdominals activated throughout the movement. x10 reps each side.         Dead [place] / Femur Arc   Lie down on your back with the legs up in the air and the knees and hips bent at 90 degrees. The low back should remain neutral or flat against the table through the whole exercise. Brace the abdominals and slowly lower one leg towards the table. Lightly tap the foot to the table and bring it back up to the starting position. Repeat the motion with the other leg. Try to maintain a 90 degree bend with both knees throughout the exercise. Keep a steady breathing pattern through the whole exercise.  x10 reps each side       LOOPED ELASTIC BAND HIP EXTENSION  While standing with an elastic band looped around your ankles, move the target leg back as shown.   Keep your knees straight the entire time.  2x10 reps each side. Make sure to stand tall and avoid leaning forward.   [facility] [address] [place], [place] [address] Phone # [phone] Fax [phone]

## 2017-08-21 ENCOUNTER — Encounter: Payer: Self-pay | Admitting: Physical Therapy

## 2017-08-21 ENCOUNTER — Ambulatory Visit: Payer: Medicare Other | Admitting: Physical Therapy

## 2017-08-21 DIAGNOSIS — M6281 Muscle weakness (generalized): Secondary | ICD-10-CM

## 2017-08-21 DIAGNOSIS — M79605 Pain in left leg: Secondary | ICD-10-CM | POA: Diagnosis not present

## 2017-08-21 DIAGNOSIS — M62838 Other muscle spasm: Secondary | ICD-10-CM

## 2017-08-21 NOTE — Therapy (Signed)
[facility] [address] [place], [place], [address] Phone: [phone]   Fax:  [phone]  Physical Therapy Treatment  Patient Details  Name: DEDRA MATSUO MRN: [id number] Date of Birth: [birth year] Referring Provider: Billey Gosling, MD    Encounter Date: 08/21/2017  PT End of Session - 08/21/17 1505    Visit Number  6    Number of Visits  16    Date for PT Re-Evaluation  09/25/17    Authorization Type  [employer] Medicare     Authorization Time Period  07/31/17 to 09/25/17    PT Start Time  1447    PT Stop Time  1529    PT Time Calculation (min)  42 min    Activity Tolerance  Patient tolerated treatment well;No increased pain    Behavior During Therapy  WFL for tasks assessed/performed       Past Medical History:  Diagnosis Date  . Arthritis    chronic neck and back pain  . Breast cancer ([place]) 1997   surgery, chemo, & radiation  . Breast cancer ([place]) 2012   surgery, chemo, & radiation with axillary dissection  . CTS (carpal tunnel syndrome) 2013   Dr [name] , Neurology  . Fasting hyperglycemia 2012   FBS 110  . Hyperlipidemia   . Hypertension   . 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 MRSA (methicillin resistant staph aureus) culture positive 2012   swab was positive, treated per protocol.  No other issues.  . Osteopenia   . Personal history of chemotherapy 1997  . Personal history of radiation therapy 1997   x2  . PONV (postoperative nausea and vomiting) 1997   no problems since    Past Surgical History:  Procedure Laterality Date  . AXILLARY NODE DISSECTION  08/2010   left  . AXILLARY NODE DISSECTION Left 2012   with axillary dissection on left  . BREAST BIOPSY  2012   x2  . BREAST LUMPECTOMY  09/1995, 10/1995   left - lumpectomy   . BREAST LUMPECTOMY  2012   Lumpectomy Axilla   . BREAST SURGERY  2012    axillary node dissection left  . CARPAL TUNNEL  RELEASE  10/29/2011   Procedure: CARPAL TUNNEL RELEASE;  Surgeon: Cammie Sickle., MD;  Location: [place];  Service: Orthopedics;  Laterality: Right;  . COLONOSCOPY  2005   negative  . DILATION AND CURETTAGE, DIAGNOSTIC / THERAPEUTIC  2002  . KNEE SURGERY  2000   left  . TRIGGER FINGER RELEASE  10/29/2011   Procedure: RELEASE TRIGGER FINGER/A-1 PULLEY;  Surgeon: Cammie Sickle., MD;  Location: [place];  Service: Orthopedics;  Laterality: Right;  right long and right index     There were no vitals filed for this visit.  Subjective Assessment - 08/21/17 1459    Subjective  Pt reports that things are going good. She has a little bit of pain in her leg earlier today, but after she did her exercises this resolved. She has a few questions about her exercises.     Pertinent History  HTN, osteopenia, axillary node disection, cancer    Limitations  Standing    Patient Stated Goals  decrease pain with standing so that she can make meals    Currently in Pain?  No/denies    Pain Onset  More than a month ago  [facility] PT Assessment - 08/21/17 0001      High Level Balance   High Level Balance Comments  SLS: Lt: 10 sec, Rt 10 sec                  [facility] Adult PT Treatment/Exercise - 08/21/17 0001      Lumbar Exercises: Aerobic   Nustep  L3 x5 min, PT present to discuss questions regarding HEP      Lumbar Exercises: Machines for Strengthening   Leg Press  seat 6, BLE with 80# and yellow TB around knees 2x15 reps (increased to 90# 2nd set)      Lumbar Exercises: Standing   Other Standing Lumbar Exercises  forward step up on 4" box 2x10 reps each, 1 UE support and contralateral knee drive, 2nd set without UE support      Lumbar Exercises: Seated   Other Seated Lumbar Exercises  on dyna disc: LE marching x10 reps each, LAQ x15 reps each    Other Seated Lumbar Exercises  seated on red physioball with B shoulder horizontal abduction x15  reps, D2 flexion each side x15 reps  red TB             PT Education - 08/21/17 1500    Education provided  Yes    Education Details  answered pt questions regarding HEP; technique with therex     Person(s) Educated  Patient    Methods  Explanation;Verbal cues    Comprehension  Verbalized understanding;Returned demonstration       PT Short Term Goals - 08/12/17 1531      PT SHORT TERM GOAL #1   Title  Pt will demo consistency and independence with her HEP to decrease pain and improve flexibility/strength of the LEs.     Time  4    Period  Weeks    Status  Achieved      PT SHORT TERM GOAL #2   Title  Pt will demo proper mechanics with sit to stand, without UE support or noted weight shift to the Rt, which will improve the functional strength of the LLE.     Time  4    Period  Weeks    Status  Achieved      PT SHORT TERM GOAL #3   Title  Pt will report atleast 30% improvement in pain from the start of therapy, which will allow her to gradually increase her activity around the house.     Baseline  30-40%     Time  4    Period  Weeks    Status  Achieved        PT Long Term Goals - 08/21/17 1505      PT LONG TERM GOAL #1   Title  Pt will demo improved BLE strength to 5/5 MMT which will increased her efficiency with daily activity.     Time  8    Period  Weeks    Status  New      PT LONG TERM GOAL #2   Title  Pt will demo improved Lt hip strength and stability, evident by her ability to maintain SLS for atleast 5 sec, 2/3 trials, without trendelenburg deviation or LOB.     Time  8    Period  Weeks    Status  New      PT LONG TERM GOAL #3   Title  Pt will be able to stay up on her feet for atleast 30  min without the need for a rest break to allow her to fix a meal for her and her spouse.    Baseline  unlimited     Time  8    Period  Weeks    Status  Achieved      PT LONG TERM GOAL #4   Title  Pt will report atleast 75% resolution of her symptoms with daily  activity from the start of therapy.    Time  8    Period  Weeks    Status  New            Plan - 08/21/17 1524    Clinical Impression Statement  Pt is making steady progress towards her goals, reporting recently that she is now able to prepare meals for herself without the need for a break. She also demonstrates significant improvements in LE strength and stability, evident by her ability to maintain single leg balance on the Lt for greater than 10 sec and minimal Trendelenburg deviation. Progressed seated trunk stability exercises as well as LE strengthening this session without any reports of increased hip/back pain. Will continue with current POC.     Rehab Potential  Good    PT Frequency  2x / week    PT Duration  8 weeks    PT Treatment/Interventions  ADLs/Self Care Home Management;Cryotherapy;Electrical Stimulation;Moist Heat;Traction;Iontophoresis 4mg /ml Dexamethasone;Gait training;Functional mobility training;Stair training;Therapeutic activities;Therapeutic exercise;Balance training;Patient/family education;Neuromuscular re-education;Manual techniques;Taping;Dry needling;Passive range of motion    PT Next Visit Plan  progress trunk strength/stability in sitting positions; progress hip strengthening in standing; FOTO next session    PT Home Exercise Plan  supine alt bent knee lower, supine SLR, supine oblique isometric, standing hip extension with yellow band     Consulted and Agree with Plan of Care  Patient       Patient will benefit from skilled therapeutic intervention in order to improve the following deficits and impairments:  Decreased activity tolerance, Decreased balance, Impaired flexibility, Hypomobility, Decreased strength, Decreased range of motion, Increased muscle spasms, Pain, Improper body mechanics  Visit Diagnosis: Pain in left leg  Muscle weakness (generalized)  Other muscle spasm     Problem List Patient Active Problem List   Diagnosis Date Noted  .  Sciatica of left side 07/25/2017  . Hyperglycemia 12/11/2016  . Essential hypertension, benign 07/16/2013  . Lymphedema of upper extremity following lymphadenectomy 11/07/2011  . Nonspecific abnormal electrocardiogram (ECG) (EKG) 09/27/2011  . Cervicalgia 04/12/2009  . BACK PAIN, LUMBAR 04/12/2009  . Hyperlipidemia 08/24/2008  . Osteoarthritis 08/24/2008  . Osteopenia 08/24/2008  . hx: breast cancer, left, invasive ductal carcinoma with axillary mets, receptor + 08/24/2008    3:30 PM,08/21/17 Sherol Dade PT, DPT [place] [facility] [address], [place], [place], [address] Phone: [phone]   Fax:  [phone]  Name: NOELLE SEASE MRN: [id number] Date of Birth: 22-Dec-1950

## 2017-08-26 ENCOUNTER — Other Ambulatory Visit: Payer: Self-pay | Admitting: Oncology

## 2017-08-26 ENCOUNTER — Encounter: Payer: Self-pay | Admitting: Physical Therapy

## 2017-08-26 ENCOUNTER — Ambulatory Visit: Payer: Medicare Other | Admitting: Physical Therapy

## 2017-08-26 DIAGNOSIS — M79605 Pain in left leg: Secondary | ICD-10-CM

## 2017-08-26 DIAGNOSIS — Z853 Personal history of malignant neoplasm of breast: Secondary | ICD-10-CM

## 2017-08-26 DIAGNOSIS — M6281 Muscle weakness (generalized): Secondary | ICD-10-CM

## 2017-08-26 DIAGNOSIS — M62838 Other muscle spasm: Secondary | ICD-10-CM

## 2017-08-26 NOTE — Therapy (Signed)
[facility] [address] [place], [place], [address] Phone: [phone]   Fax:  [phone]  Physical Therapy Treatment  Patient Details  Name: Shirley Melendez MRN: [id number] Date of Birth: 06-04-1951 Referring Provider: Billey Gosling, MD    Encounter Date: 08/26/2017  PT End of Session - 08/26/17 1459    Visit Number  7    Number of Visits  16    Date for PT Re-Evaluation  09/25/17    Authorization Type  [employer] Medicare     Authorization Time Period  07/31/17 to 09/25/17    PT Start Time  1646    PT Stop Time  1725    PT Time Calculation (min)  39 min    Activity Tolerance  Patient tolerated treatment well;No increased pain    Behavior During Therapy  WFL for tasks assessed/performed       Past Medical History:  Diagnosis Date  . Arthritis    chronic neck and back pain  . Breast cancer ([place]) 1997   surgery, chemo, & radiation  . Breast cancer ([place]) 2012   surgery, chemo, & radiation with axillary dissection  . CTS (carpal tunnel syndrome) 2013   Dr [name] , Neurology  . Fasting hyperglycemia 2012   FBS 110  . Hyperlipidemia   . Hypertension   . 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 MRSA (methicillin resistant staph aureus) culture positive 2012   swab was positive, treated per protocol.  No other issues.  . Osteopenia   . Personal history of chemotherapy 1997  . Personal history of radiation therapy 1997   x2  . PONV (postoperative nausea and vomiting) 1997   no problems since    Past Surgical History:  Procedure Laterality Date  . AXILLARY NODE DISSECTION  08/2010   left  . AXILLARY NODE DISSECTION Left 2012   with axillary dissection on left  . BREAST BIOPSY  2012   x2  . BREAST LUMPECTOMY  09/1995, 10/1995   left - lumpectomy   . BREAST LUMPECTOMY  2012   Lumpectomy Axilla   . BREAST SURGERY  2012    axillary node dissection left  . CARPAL  TUNNEL RELEASE  10/29/2011   Procedure: CARPAL TUNNEL RELEASE;  Surgeon: Cammie Sickle., MD;  Location: [place];  Service: Orthopedics;  Laterality: Right;  . COLONOSCOPY  2005   negative  . DILATION AND CURETTAGE, DIAGNOSTIC / THERAPEUTIC  2002  . KNEE SURGERY  2000   left  . TRIGGER FINGER RELEASE  10/29/2011   Procedure: RELEASE TRIGGER FINGER/A-1 PULLEY;  Surgeon: Cammie Sickle., MD;  Location: [place];  Service: Orthopedics;  Laterality: Right;  right long and right index     There were no vitals filed for this visit.  Subjective Assessment - 08/26/17 1533    Subjective  Pt notes things are going well. She has no issues currently. Overall, pt feels she is 75% improved since starting therapy, but she has not gotten back to walking because of the weather.     Pertinent History  HTN, osteopenia, axillary node disection, cancer    Limitations  Standing    Patient Stated Goals  decrease pain with standing so that she can make meals    Currently in Pain?  No/denies    Pain Onset  More than a month ago  [facility] Adult PT Treatment/Exercise - 08/26/17 0001      Lumbar Exercises: Aerobic   Nustep  L4 x8 min, PT present to discuss session and improvements in functional mobility      Lumbar Exercises: Machines for Strengthening   Leg Press  seat 5 BLE #100 2x10 reps minimal cuing needed to decrease knee valgus tendency      Lumbar Exercises: Standing   Other Standing Lumbar Exercises  forward step up on 6" box 2x10 reps each side, 0 UE support and contralateral knee drive      Manual Therapy   Soft tissue mobilization  STM Lt lumbar paraspinals    Myofascial Release  TPR Lt QL             PT Education - 08/26/17 1527    Education provided  Yes    Education Details  adjustments to PT frequency; POC moving forward    Person(s) Educated  Patient    Methods  Explanation    Comprehension  Verbalized  understanding       PT Short Term Goals - 08/12/17 1531      PT SHORT TERM GOAL #1   Title  Pt will demo consistency and independence with her HEP to decrease pain and improve flexibility/strength of the LEs.     Time  4    Period  Weeks    Status  Achieved      PT SHORT TERM GOAL #2   Title  Pt will demo proper mechanics with sit to stand, without UE support or noted weight shift to the Rt, which will improve the functional strength of the LLE.     Time  4    Period  Weeks    Status  Achieved      PT SHORT TERM GOAL #3   Title  Pt will report atleast 30% improvement in pain from the start of therapy, which will allow her to gradually increase her activity around the house.     Baseline  30-40%     Time  4    Period  Weeks    Status  Achieved        PT Long Term Goals - 08/26/17 1500      PT LONG TERM GOAL #1   Title  Pt will demo improved BLE strength to 5/5 MMT which will increased her efficiency with daily activity.     Time  8    Period  Weeks    Status  On-going      PT LONG TERM GOAL #2   Title  Pt will demo improved Lt hip strength and stability, evident by her ability to maintain SLS for atleast 5 sec, 2/3 trials, without trendelenburg deviation or LOB.     Time  8    Period  Weeks    Status  On-going      PT LONG TERM GOAL #3   Title  Pt will be able to stay up on her feet for atleast 30 min without the need for a rest break to allow her to fix a meal for her and her spouse.    Baseline  unlimited     Time  8    Period  Weeks    Status  Achieved      PT LONG TERM GOAL #4   Title  Pt will report atleast 75% resolution of her symptoms with daily activity from the start of therapy.    Baseline  75%  improved overall     Time  8    Period  Weeks    Status  Achieved            Plan - 08/26/17 1527    Clinical Impression Statement  Pt is making significant progress towards her goals, reporting atleast 75% improvement in her pain, strength and  endurance overall since beginning PT. Pt's FOTO score decreased from 53% limitation to 23% limitation this visit. Due to pt's progress, therapist discussed decreasing PT frequency to 1x/week which will allow for pt independence and establishment of a detailed HEP for possible d/c in the next couple of weeks. Pt was agreeable to this. Ended with soft tissue mobilization techniques to decrease muscle spasm and tension in the Lt quadratus and paraspinals. Ended session without reports of pain and pt verbalized good understanding of everything discussed.     Rehab Potential  Good    PT Frequency  2x / week    PT Duration  8 weeks    PT Treatment/Interventions  ADLs/Self Care Home Management;Cryotherapy;Electrical Stimulation;Moist Heat;Traction;Iontophoresis 4mg /ml Dexamethasone;Gait training;Functional mobility training;Stair training;Therapeutic activities;Therapeutic exercise;Balance training;Patient/family education;Neuromuscular re-education;Manual techniques;Taping;Dry needling;Passive range of motion    PT Next Visit Plan  f/u on muscle spasm Lt low back and possible DN to the area if not improved; discuss walking progression; functional trunk and hip strengthening     PT Home Exercise Plan  supine alt bent knee lower, supine SLR, supine oblique isometric, standing hip extension with yellow band     Consulted and Agree with Plan of Care  Patient       Patient will benefit from skilled therapeutic intervention in order to improve the following deficits and impairments:  Decreased activity tolerance, Decreased balance, Impaired flexibility, Hypomobility, Decreased strength, Decreased range of motion, Increased muscle spasms, Pain, Improper body mechanics  Visit Diagnosis: Pain in left leg  Muscle weakness (generalized)  Other muscle spasm     Problem List Patient Active Problem List   Diagnosis Date Noted  . Sciatica of left side 07/25/2017  . Hyperglycemia 12/11/2016  . Essential  hypertension, benign 07/16/2013  . Lymphedema of upper extremity following lymphadenectomy 11/07/2011  . Nonspecific abnormal electrocardiogram (ECG) (EKG) 09/27/2011  . Cervicalgia 04/12/2009  . BACK PAIN, LUMBAR 04/12/2009  . Hyperlipidemia 08/24/2008  . Osteoarthritis 08/24/2008  . Osteopenia 08/24/2008  . hx: breast cancer, left, invasive ductal carcinoma with axillary mets, receptor + 08/24/2008    3:35 PM,08/26/17 Sherol Dade PT, DPT [facility] [address] [place], [place], [address] Phone: [phone]   Fax:  [phone]  Name: Shirley Melendez MRN: [id number] Date of Birth: [birth year]

## 2017-08-28 ENCOUNTER — Ambulatory Visit: Payer: Medicare Other | Admitting: Physical Therapy

## 2017-08-28 ENCOUNTER — Encounter: Payer: Self-pay | Admitting: Physical Therapy

## 2017-08-28 DIAGNOSIS — M62838 Other muscle spasm: Secondary | ICD-10-CM

## 2017-08-28 DIAGNOSIS — M79605 Pain in left leg: Secondary | ICD-10-CM | POA: Diagnosis not present

## 2017-08-28 DIAGNOSIS — M6281 Muscle weakness (generalized): Secondary | ICD-10-CM

## 2017-08-28 NOTE — Therapy (Signed)
[facility] [address] [place], [place], [address] Phone: [phone]   Fax:  [phone]  Physical Therapy Treatment  Patient Details  Name: Shirley Melendez MRN: [id number] Date of Birth: 04-30-1951 Referring Provider: Billey Gosling, MD    Encounter Date: 08/28/2017  PT End of Session - 08/28/17 1525    Visit Number  8    Number of Visits  16    Date for PT Re-Evaluation  09/25/17    Authorization Type  [employer] Medicare     Authorization Time Period  07/31/17 to 09/25/17    PT Start Time  1450    PT Stop Time  1530    PT Time Calculation (min)  40 min    Activity Tolerance  Patient tolerated treatment well;No increased pain    Behavior During Therapy  WFL for tasks assessed/performed       Past Medical History:  Diagnosis Date  . Arthritis    chronic neck and back pain  . Breast cancer ([place]) 1997   surgery, chemo, & radiation  . Breast cancer ([place]) 2012   surgery, chemo, & radiation with axillary dissection  . CTS (carpal tunnel syndrome) 2013   Dr [name] , Neurology  . Fasting hyperglycemia 2012   FBS 110  . Hyperlipidemia   . Hypertension   . 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 MRSA (methicillin resistant staph aureus) culture positive 2012   swab was positive, treated per protocol.  No other issues.  . Osteopenia   . Personal history of chemotherapy 1997  . Personal history of radiation therapy 1997   x2  . PONV (postoperative nausea and vomiting) 1997   no problems since    Past Surgical History:  Procedure Laterality Date  . AXILLARY NODE DISSECTION  08/2010   left  . AXILLARY NODE DISSECTION Left 2012   with axillary dissection on left  . BREAST BIOPSY  2012   x2  . BREAST LUMPECTOMY  09/1995, 10/1995   left - lumpectomy   . BREAST LUMPECTOMY  2012   Lumpectomy Axilla   . BREAST SURGERY  2012    axillary node dissection left  . CARPAL  TUNNEL RELEASE  10/29/2011   Procedure: CARPAL TUNNEL RELEASE;  Surgeon: Cammie Sickle., MD;  Location: [facility];  Service: Orthopedics;  Laterality: Right;  . COLONOSCOPY  2005   negative  . DILATION AND CURETTAGE, DIAGNOSTIC / THERAPEUTIC  2002  . KNEE SURGERY  2000   left  . TRIGGER FINGER RELEASE  10/29/2011   Procedure: RELEASE TRIGGER FINGER/A-1 PULLEY;  Surgeon: Cammie Sickle., MD;  Location: [place];  Service: Orthopedics;  Laterality: Right;  right long and right index     There were no vitals filed for this visit.  Subjective Assessment - 08/28/17 1507    Subjective  Pt reports that her back seemed better following her last session. She has no pain currently.     Pertinent History  HTN, osteopenia, axillary node disection, cancer    Limitations  Standing    Patient Stated Goals  decrease pain with standing so that she can make meals    Currently in Pain?  No/denies    Pain Onset  More than a month ago                      [facility]  Treatment/Exercise - 08/29/17 0001      Lumbar Exercises: Aerobic   Nustep  1 minute intervals from L2 to L5 x6 min, PT present to discuss session       Lumbar Exercises: Machines for Strengthening   Leg Press  Seat 5 BLE #100 2x15      Lumbar Exercises: Standing   Other Standing Lumbar Exercises  forward step up on 6" box with foam pad, 2x10 reps each side, 0 UE support and contralateral knee drive, PT providing CGA initially for safety.       Lumbar Exercises: Seated   Other Seated Lumbar Exercises  seated trunk rotation Rt and Lt with elbows extended and red TB x15 reps each      Manual Therapy   Soft tissue mobilization  STM Lt lumbar paraspinals    Myofascial Release  TPR Lt QL             PT Education - 08/28/17 1627    Education provided  Yes    Education Details  encouraged pt to get back into her walking routine and recommended starting at 10-15 minutes to  allow her to work back up to her prior level    Person(s) Educated  Patient    Methods  Explanation    Comprehension  Verbalized understanding       PT Short Term Goals - 08/12/17 1531      PT SHORT TERM GOAL #1   Title  Pt will demo consistency and independence with her HEP to decrease pain and improve flexibility/strength of the LEs.     Time  4    Period  Weeks    Status  Achieved      PT SHORT TERM GOAL #2   Title  Pt will demo proper mechanics with sit to stand, without UE support or noted weight shift to the Rt, which will improve the functional strength of the LLE.     Time  4    Period  Weeks    Status  Achieved      PT SHORT TERM GOAL #3   Title  Pt will report atleast 30% improvement in pain from the start of therapy, which will allow her to gradually increase her activity around the house.     Baseline  30-40%     Time  4    Period  Weeks    Status  Achieved        PT Long Term Goals - 08/26/17 1500      PT LONG TERM GOAL #1   Title  Pt will demo improved BLE strength to 5/5 MMT which will increased her efficiency with daily activity.     Time  8    Period  Weeks    Status  On-going      PT LONG TERM GOAL #2   Title  Pt will demo improved Lt hip strength and stability, evident by her ability to maintain SLS for atleast 5 sec, 2/3 trials, without trendelenburg deviation or LOB.     Time  8    Period  Weeks    Status  On-going      PT LONG TERM GOAL #3   Title  Pt will be able to stay up on her feet for atleast 30 min without the need for a rest break to allow her to fix a meal for her and her spouse.    Baseline  unlimited     Time  8  Period  Weeks    Status  Achieved      PT LONG TERM GOAL #4   Title  Pt will report atleast 75% resolution of her symptoms with daily activity from the start of therapy.    Baseline  75% improved overall     Time  8    Period  Weeks    Status  Achieved            Plan - 08/28/17 1525    Clinical Impression  Statement  Pt continues to do well, noting improvements in back tightness/discomfort following last session. Continued with additional soft tissue techniques today in order to further address trigger points along the Lt lumbar paraspinals. Further progressed LE strengthening exercises, noting good single leg stability during uneven step ups. Will begin developing finalized HEP and plan for d/c in 2 sessions assuming no issues arise.     Rehab Potential  Good    PT Frequency  2x / week    PT Duration  8 weeks    PT Treatment/Interventions  ADLs/Self Care Home Management;Cryotherapy;Electrical Stimulation;Moist Heat;Traction;Iontophoresis 4mg /ml Dexamethasone;Gait training;Functional mobility training;Stair training;Therapeutic activities;Therapeutic exercise;Balance training;Patient/family education;Neuromuscular re-education;Manual techniques;Taping;Dry needling;Passive range of motion    PT Next Visit Plan  f/u on muscle spasm Lt low back and possible DN to the area if not improved; f/u on walking progression (10-15 min initially); functional trunk and hip strengthening; update HEP     PT Home Exercise Plan  supine alt bent knee lower, supine SLR, supine oblique isometric, standing hip extension with yellow band     Consulted and Agree with Plan of Care  Patient       Patient will benefit from skilled therapeutic intervention in order to improve the following deficits and impairments:  Decreased activity tolerance, Decreased balance, Impaired flexibility, Hypomobility, Decreased strength, Decreased range of motion, Increased muscle spasms, Pain, Improper body mechanics  Visit Diagnosis: Pain in left leg  Muscle weakness (generalized)  Other muscle spasm     Problem List Patient Active Problem List   Diagnosis Date Noted  . Sciatica of left side 07/25/2017  . Hyperglycemia 12/11/2016  . Essential hypertension, benign 07/16/2013  . Lymphedema of upper extremity following lymphadenectomy  11/07/2011  . Nonspecific abnormal electrocardiogram (ECG) (EKG) 09/27/2011  . Cervicalgia 04/12/2009  . BACK PAIN, LUMBAR 04/12/2009  . Hyperlipidemia 08/24/2008  . Osteoarthritis 08/24/2008  . Osteopenia 08/24/2008  . hx: breast cancer, left, invasive ductal carcinoma with axillary mets, receptor + 08/24/2008    8:05 AM,08/29/17 Sherol Dade PT, DPT [facility] [address], [place], [place], [address] Phone: [phone]   Fax:  [phone]  Name: Shirley Melendez MRN: [id number] Date of Birth: [birth year]

## 2017-09-02 ENCOUNTER — Encounter: Payer: Medicare Other | Admitting: Physical Therapy

## 2017-09-04 ENCOUNTER — Ambulatory Visit: Payer: Medicare Other | Admitting: Physical Therapy

## 2017-09-04 ENCOUNTER — Encounter: Payer: Self-pay | Admitting: Physical Therapy

## 2017-09-04 DIAGNOSIS — M62838 Other muscle spasm: Secondary | ICD-10-CM

## 2017-09-04 DIAGNOSIS — M79605 Pain in left leg: Secondary | ICD-10-CM | POA: Diagnosis not present

## 2017-09-04 DIAGNOSIS — M6281 Muscle weakness (generalized): Secondary | ICD-10-CM

## 2017-09-04 NOTE — Therapy (Signed)
[facility] [address] [place], [place], [address] Phone: [phone]   Fax:  [phone]  Physical Therapy Treatment  Patient Details  Name: Shirley Melendez MRN: [id number] Date of Birth: 08-Nov-1950 Referring Provider: Billey Gosling, MD    Encounter Date: 09/04/2017  PT End of Session - 09/04/17 1452    Visit Number  9    Number of Visits  16    Date for PT Re-Evaluation  09/25/17    Authorization Type  [employer] Medicare     Authorization Time Period  07/31/17 to 09/25/17    PT Start Time  1450    PT Stop Time  1528    PT Time Calculation (min)  38 min    Activity Tolerance  Patient tolerated treatment well;No increased pain    Behavior During Therapy  WFL for tasks assessed/performed       Past Medical History:  Diagnosis Date  . Arthritis    chronic neck and back pain  . Breast cancer ([place]) 1997   surgery, chemo, & radiation  . Breast cancer ([place]) 2012   surgery, chemo, & radiation with axillary dissection  . CTS (carpal tunnel syndrome) 2013   Dr [name] , Neurology  . Fasting hyperglycemia 2012   FBS 110  . Hyperlipidemia   . Hypertension   . 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 MRSA (methicillin resistant staph aureus) culture positive 2012   swab was positive, treated per protocol.  No other issues.  . Osteopenia   . Personal history of chemotherapy 1997  . Personal history of radiation therapy 1997   x2  . PONV (postoperative nausea and vomiting) 1997   no problems since    Past Surgical History:  Procedure Laterality Date  . AXILLARY NODE DISSECTION  08/2010   left  . AXILLARY NODE DISSECTION Left 2012   with axillary dissection on left  . BREAST BIOPSY  2012   x2  . BREAST LUMPECTOMY  09/1995, 10/1995   left - lumpectomy   . BREAST LUMPECTOMY  2012   Lumpectomy Axilla   . BREAST SURGERY  2012    axillary node dissection left  . CARPAL  TUNNEL RELEASE  10/29/2011   Procedure: CARPAL TUNNEL RELEASE;  Surgeon: Cammie Sickle., MD;  Location: [facility];  Service: Orthopedics;  Laterality: Right;  . COLONOSCOPY  2005   negative  . DILATION AND CURETTAGE, DIAGNOSTIC / THERAPEUTIC  2002  . KNEE SURGERY  2000   left  . TRIGGER FINGER RELEASE  10/29/2011   Procedure: RELEASE TRIGGER FINGER/A-1 PULLEY;  Surgeon: Cammie Sickle., MD;  Location: [facility];  Service: Orthopedics;  Laterality: Right;  right long and right index     There were no vitals filed for this visit.  Subjective Assessment - 09/04/17 1452    Subjective  Pt states she is getting a slight twinge sometimes but is feeling a lot better.      Pertinent History  HTN, osteopenia, axillary node disection, cancer    Limitations  Standing    Patient Stated Goals  decrease pain with standing so that she can make meals    Currently in Pain?  No/denies                      [facility] Adult PT Treatment/Exercise - 09/04/17 0001      Lumbar Exercises:  Aerobic   Nustep  1 minute intervals from L2 to L5 x8 min, PT present to discuss session       Lumbar Exercises: Machines for Strengthening   Leg Press  Seat 5 BLE #105 2x15      Lumbar Exercises: Standing   Other Standing Lumbar Exercises  --      Lumbar Exercises: Seated   Other Seated Lumbar Exercises  seated on red pball LE march and kick out - 15 reps each    Other Seated Lumbar Exercises  seated on red physioball with B shoulder horizontal abduction x15 reps, D2 flexion each side x15 reps  red TB      Manual Therapy   Soft tissue mobilization  STM Lt lumbar paraspinals, glutes, iliopsoas    Myofascial Release  TPR Lt QL      open book stretch sidelying - 10x each side         PT Short Term Goals - 08/12/17 1531      PT SHORT TERM GOAL #1   Title  Pt will demo consistency and independence with her HEP to decrease pain and improve flexibility/strength  of the LEs.     Time  4    Period  Weeks    Status  Achieved      PT SHORT TERM GOAL #2   Title  Pt will demo proper mechanics with sit to stand, without UE support or noted weight shift to the Rt, which will improve the functional strength of the LLE.     Time  4    Period  Weeks    Status  Achieved      PT SHORT TERM GOAL #3   Title  Pt will report atleast 30% improvement in pain from the start of therapy, which will allow her to gradually increase her activity around the house.     Baseline  30-40%     Time  4    Period  Weeks    Status  Achieved        PT Long Term Goals - 08/26/17 1500      PT LONG TERM GOAL #1   Title  Pt will demo improved BLE strength to 5/5 MMT which will increased her efficiency with daily activity.     Time  8    Period  Weeks    Status  On-going      PT LONG TERM GOAL #2   Title  Pt will demo improved Lt hip strength and stability, evident by her ability to maintain SLS for atleast 5 sec, 2/3 trials, without trendelenburg deviation or LOB.     Time  8    Period  Weeks    Status  On-going      PT LONG TERM GOAL #3   Title  Pt will be able to stay up on her feet for atleast 30 min without the need for a rest break to allow her to fix a meal for her and her spouse.    Baseline  unlimited     Time  8    Period  Weeks    Status  Achieved      PT LONG TERM GOAL #4   Title  Pt will report atleast 75% resolution of her symptoms with daily activity from the start of therapy.    Baseline  75% improved overall     Time  8    Period  Weeks    Status  Achieved  Plan - 09/04/17 1529    Clinical Impression Statement  Patient continues to make progress and improvements in back.  She has mild spasms on left low back as stated in treatment muscle were release with STM and TPR.  Pt will most likely be ready to discharge next visit.    PT Treatment/Interventions  ADLs/Self Care Home Management;Cryotherapy;Electrical Stimulation;Moist  Heat;Traction;Iontophoresis 4mg /ml Dexamethasone;Gait training;Functional mobility training;Stair training;Therapeutic activities;Therapeutic exercise;Balance training;Patient/family education;Neuromuscular re-education;Manual techniques;Taping;Dry needling;Passive range of motion    PT Next Visit Plan  MMT and balance goals addressed for discharge; f/u on muscle spasm Lt low back; f/u on walking progression (10-15 min initially); functional trunk and hip strengthening; update HEP     Consulted and Agree with Plan of Care  Patient       Patient will benefit from skilled therapeutic intervention in order to improve the following deficits and impairments:  Decreased activity tolerance, Decreased balance, Impaired flexibility, Hypomobility, Decreased strength, Decreased range of motion, Increased muscle spasms, Pain, Improper body mechanics  Visit Diagnosis: Pain in left leg  Muscle weakness (generalized)  Other muscle spasm     Problem List Patient Active Problem List   Diagnosis Date Noted  . Sciatica of left side 07/25/2017  . Hyperglycemia 12/11/2016  . Essential hypertension, benign 07/16/2013  . Lymphedema of upper extremity following lymphadenectomy 11/07/2011  . Nonspecific abnormal electrocardiogram (ECG) (EKG) 09/27/2011  . Cervicalgia 04/12/2009  . BACK PAIN, LUMBAR 04/12/2009  . Hyperlipidemia 08/24/2008  . Osteoarthritis 08/24/2008  . Osteopenia 08/24/2008  . hx: breast cancer, left, invasive ductal carcinoma with axillary mets, receptor + 08/24/2008    Zannie Cove 09/04/2017, 3:33 PM  [facility] [address] [place], [place], [address] Phone: [phone]   Fax:  [phone]  Name: Shirley Melendez MRN: [id number] Date of Birth: 02-27-1951

## 2017-09-09 ENCOUNTER — Encounter: Payer: Self-pay | Admitting: Physical Therapy

## 2017-09-09 ENCOUNTER — Ambulatory Visit: Payer: Medicare Other | Admitting: Physical Therapy

## 2017-09-09 DIAGNOSIS — M79605 Pain in left leg: Secondary | ICD-10-CM | POA: Diagnosis not present

## 2017-09-09 DIAGNOSIS — M6281 Muscle weakness (generalized): Secondary | ICD-10-CM

## 2017-09-09 DIAGNOSIS — M62838 Other muscle spasm: Secondary | ICD-10-CM

## 2017-09-09 NOTE — Therapy (Signed)
[facility] [address], [place], [place], [address] Phone: [phone]   Fax:  [phone]  Physical Therapy Treatment/Discharge  Patient Details  Name: Shirley Melendez MRN: [id number] Date of Birth: 1950/08/12 Referring Provider: Billey Gosling, MD    Encounter Date: 09/09/2017  PT End of Session - 09/09/17 1531    Visit Number  10    Number of Visits  16    Date for PT Re-Evaluation  09/25/17    Authorization Type  [employer] Medicare     Authorization Time Period  07/31/17 to 09/25/17    PT Start Time  1440    PT Stop Time  1527    PT Time Calculation (min)  47 min    Activity Tolerance  Patient tolerated treatment well;No increased pain    Behavior During Therapy  WFL for tasks assessed/performed       Past Medical History:  Diagnosis Date  . Arthritis    chronic neck and back pain  . Breast cancer ([place]) 1997   surgery, chemo, & radiation  . Breast cancer ([place]) 2012   surgery, chemo, & radiation with axillary dissection  . CTS (carpal tunnel syndrome) 2013   Dr [name] , Neurology  . Fasting hyperglycemia 2012   FBS 110  . Hyperlipidemia   . Hypertension   . 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 MRSA (methicillin resistant staph aureus) culture positive 2012   swab was positive, treated per protocol.  No other issues.  . Osteopenia   . Personal history of chemotherapy 1997  . Personal history of radiation therapy 1997   x2  . PONV (postoperative nausea and vomiting) 1997   no problems since    Past Surgical History:  Procedure Laterality Date  . AXILLARY NODE DISSECTION  08/2010   left  . AXILLARY NODE DISSECTION Left 2012   with axillary dissection on left  . BREAST BIOPSY  2012   x2  . BREAST LUMPECTOMY  09/1995, 10/1995   left - lumpectomy   . BREAST LUMPECTOMY  2012   Lumpectomy Axilla   . BREAST SURGERY  2012    axillary node dissection left  .  CARPAL TUNNEL RELEASE  10/29/2011   Procedure: CARPAL TUNNEL RELEASE;  Surgeon: Cammie Sickle., MD;  Location: [facility];  Service: Orthopedics;  Laterality: Right;  . COLONOSCOPY  2005   negative  . DILATION AND CURETTAGE, DIAGNOSTIC / THERAPEUTIC  2002  . KNEE SURGERY  2000   left  . TRIGGER FINGER RELEASE  10/29/2011   Procedure: RELEASE TRIGGER FINGER/A-1 PULLEY;  Surgeon: Cammie Sickle., MD;  Location: [facility];  Service: Orthopedics;  Laterality: Right;  right long and right index     There were no vitals filed for this visit.  Subjective Assessment - 09/09/17 1445    Subjective  doing well; feels like she's back to regular activity    Patient Stated Goals  decrease pain with standing so that she can make meals    Currently in Pain?  No/denies         [facility] PT Assessment - 09/09/17 1446      Observation/Other Assessments   Focus on Therapeutic Outcomes (FOTO)   76 (24% limited)      Functional Tests   Functional tests  Single leg stance      Single Leg Stance   Comments  bil  LE: > 10 sec x 3 trials without trendelenburg or significant instability      Strength   Right Hip Flexion  5/5    Right Hip Extension  5/5    Right Hip ABduction  5/5    Left Hip Flexion  5/5    Left Hip Extension  5/5    Left Hip ABduction  5/5            No data recorded       [facility] Adult PT Treatment/Exercise - 09/09/17 1444      Self-Care   Self-Care  Other Self-Care Comments    Other Self-Care Comments   verbally discussed current walking program (20 min a few times a week) and how to progress to previous walking program (30 min-1 hour almost daily); discussed increasing time by 2-3 min every few days and increasing frequency as weather allows; pt verbalized understanding      Exercises   Exercises  Knee/Hip      Lumbar Exercises: Stretches   Active Hamstring Stretch  Right;Left;3 reps;10 seconds    Active Hamstring Stretch  Limitations  90/90 position     Hip Flexor Stretch  2 reps;Left;Right;30 seconds    Hip Flexor Stretch Limitations  utilized strap for quad stretch due to hamstring cramping; supine with LE off mat    Figure 4 Stretch  2 reps;30 seconds;Supine;With overpressure      Lumbar Exercises: Aerobic   Nustep  1 minute intervals from L2 to L5 x8 min, PT present to discuss session       Lumbar Exercises: Sidelying   Clam  Both;20 reps    Clam Limitations  green theraband      Knee/Hip Exercises: Standing   Hip Abduction  Both;2 sets;10 reps;Knee straight    Abduction Limitations  green theraband    Hip Extension  Both;10 reps;2 sets    Extension Limitations  green theraband             PT Education - 09/09/17 1531    Education provided  Yes    Education Details  HEP, walking program progression    Person(s) Educated  Patient    Methods  Explanation;Demonstration;Handout    Comprehension  Verbalized understanding;Returned demonstration       PT Short Term Goals - 08/12/17 1531      PT SHORT TERM GOAL #1   Title  Pt will demo consistency and independence with her HEP to decrease pain and improve flexibility/strength of the LEs.     Time  4    Period  Weeks    Status  Achieved      PT SHORT TERM GOAL #2   Title  Pt will demo proper mechanics with sit to stand, without UE support or noted weight shift to the Rt, which will improve the functional strength of the LLE.     Time  4    Period  Weeks    Status  Achieved      PT SHORT TERM GOAL #3   Title  Pt will report atleast 30% improvement in pain from the start of therapy, which will allow her to gradually increase her activity around the house.     Baseline  30-40%     Time  4    Period  Weeks    Status  Achieved        PT Long Term Goals - 09/09/17 1532      PT LONG TERM GOAL #1  Title  Pt will demo improved BLE strength to 5/5 MMT which will increased her efficiency with daily activity.     Time  8    Period   Weeks    Status  Achieved      PT LONG TERM GOAL #2   Title  Pt will demo improved Lt hip strength and stability, evident by her ability to maintain SLS for atleast 5 sec, 2/3 trials, without trendelenburg deviation or LOB.     Time  8    Period  Weeks    Status  Achieved      PT LONG TERM GOAL #3   Title  Pt will be able to stay up on her feet for atleast 30 min without the need for a rest break to allow her to fix a meal for her and her spouse.    Baseline  unlimited     Time  8    Period  Weeks    Status  Achieved      PT LONG TERM GOAL #4   Title  Pt will report atleast 75% resolution of her symptoms with daily activity from the start of therapy.    Baseline  75% improved overall     Time  8    Period  Weeks    Status  Achieved            Plan - 09/09/17 1532    Clinical Impression Statement  Pt has met all goals and is ready for d/c.  Educated on importance of continuing HEP and regluar walking for exercise.  Pt verbalized understanding.    PT Treatment/Interventions  ADLs/Self Care Home Management;Cryotherapy;Electrical Stimulation;Moist Heat;Traction;Iontophoresis 74m/ml Dexamethasone;Gait training;Functional mobility training;Stair training;Therapeutic activities;Therapeutic exercise;Balance training;Patient/family education;Neuromuscular re-education;Manual techniques;Taping;Dry needling;Passive range of motion    PT Next Visit Plan  d/c PT today    Consulted and Agree with Plan of Care  Patient       Patient will benefit from skilled therapeutic intervention in order to improve the following deficits and impairments:  Decreased activity tolerance, Decreased balance, Impaired flexibility, Hypomobility, Decreased strength, Decreased range of motion, Increased muscle spasms, Pain, Improper body mechanics  Visit Diagnosis: Pain in left leg  Muscle weakness (generalized)  Other muscle spasm     Problem List Patient Active Problem List   Diagnosis Date Noted   . Sciatica of left side 07/25/2017  . Hyperglycemia 12/11/2016  . Essential hypertension, benign 07/16/2013  . Lymphedema of upper extremity following lymphadenectomy 11/07/2011  . Nonspecific abnormal electrocardiogram (ECG) (EKG) 09/27/2011  . Cervicalgia 04/12/2009  . BACK PAIN, LUMBAR 04/12/2009  . Hyperlipidemia 08/24/2008  . Osteoarthritis 08/24/2008  . Osteopenia 08/24/2008  . hx: breast cancer, left, invasive ductal carcinoma with axillary mets, receptor + 08/24/2008      SLaureen Abrahams PT, DPT 09/09/17 3:33 PM    [facility] [address] [place] [address]Phone: [phone]  Fax:  [phone] Name: GKIMANH TEMPLEMANMRN: [id number] of Birth: 4Mar 05, 1952     PHYSICAL THERAPY DISCHARGE SUMMARY  Visits from Start of Care: 10  Current functional level related to goals / functional outcomes: See above   Remaining deficits: See above   Education / Equipment: HEP  Plan: Patient agrees to discharge.  Patient goals were met. Patient is being discharged due to meeting the stated rehab goals.  ?????    SLaureen Abrahams PT, DPT 09/09/17 3:34 PM  [facility] [place] [address] [place], [place] [address]  [phone] (office) [phone] (fax)

## 2017-09-09 NOTE — Patient Instructions (Signed)
Access Code: [facility]  URL: [email or web address]  Date: 09/09/2017  Prepared by: Faustino Congress   Exercises  Standing Hip Abduction with Anchored Resistance - 10 reps - 1-2 sets - 1x daily - 7x weekly

## 2017-09-11 ENCOUNTER — Encounter: Payer: Medicare Other | Admitting: Physical Therapy

## 2017-10-08 ENCOUNTER — Other Ambulatory Visit: Payer: Self-pay | Admitting: Internal Medicine

## 2017-10-08 DIAGNOSIS — I10 Essential (primary) hypertension: Secondary | ICD-10-CM

## 2017-10-17 ENCOUNTER — Other Ambulatory Visit: Payer: Self-pay | Admitting: Internal Medicine

## 2017-10-17 DIAGNOSIS — Z1231 Encounter for screening mammogram for malignant neoplasm of breast: Secondary | ICD-10-CM

## 2017-11-07 ENCOUNTER — Ambulatory Visit
Admission: RE | Admit: 2017-11-07 | Discharge: 2017-11-07 | Disposition: A | Discharge status: Home / Self Care | Payer: Medicare Other | Source: Ambulatory Visit | Attending: Internal Medicine | Admitting: Internal Medicine

## 2017-11-07 DIAGNOSIS — Z1231 Encounter for screening mammogram for malignant neoplasm of breast: Secondary | ICD-10-CM

## 2017-12-11 ENCOUNTER — Telehealth: Payer: Self-pay | Admitting: Emergency Medicine

## 2017-12-11 DIAGNOSIS — R739 Hyperglycemia, unspecified: Secondary | ICD-10-CM

## 2017-12-11 DIAGNOSIS — I10 Essential (primary) hypertension: Secondary | ICD-10-CM

## 2017-12-11 DIAGNOSIS — E7800 Pure hypercholesterolemia, unspecified: Secondary | ICD-10-CM

## 2017-12-11 DIAGNOSIS — Z0000 Encounter for general adult medical examination without abnormal findings: Secondary | ICD-10-CM

## 2017-12-11 NOTE — Telephone Encounter (Signed)
LVm advising pt orders have been placed and labs need to be fasting.

## 2017-12-11 NOTE — Telephone Encounter (Signed)
Copied from [place] [id number]. Topic: Inquiry >> Dec 11, 2017 11:38 AM Scherrie Gerlach wrote: Reason for CRM: pt states she was told she could have her cpx labs done the day of her AWV on 12/12/17. Pt has cpx on 12/16/17. The only order I see is non fasting.  Pt wants to know if she is getting fasting labs tomorrow, as this appt is at 2 pm and so is her cpx.(2 pm)

## 2017-12-12 ENCOUNTER — Ambulatory Visit (INDEPENDENT_AMBULATORY_CARE_PROVIDER_SITE_OTHER): Payer: Medicare Other | Admitting: *Deleted

## 2017-12-12 VITALS — BP 112/58 | HR 61 | Resp 18 | Ht 60.0 in | Wt 110.0 lb

## 2017-12-12 DIAGNOSIS — Z0000 Encounter for general adult medical examination without abnormal findings: Secondary | ICD-10-CM | POA: Diagnosis not present

## 2017-12-12 NOTE — Patient Instructions (Addendum)
[facility] in [place], [place] in: [place] Address: [address], [place], [place] [address] Phone: [phone]  [facility]ologist in [place], [place] Address: [address], [place], [place] [address] Phone: [phone]  Continue doing brain stimulating activities (puzzles, reading, adult coloring books, staying active) to keep memory sharp.   Continue to eat heart healthy diet (full of fruits, vegetables, whole grains, lean protein, water--limit salt, fat, and sugar intake) and increase physical activity as tolerated.   Shirley Melendez , Thank you for taking time to come for your Medicare Wellness Visit. I appreciate your ongoing commitment to your health goals. Please review the following plan we discussed and let me know if I can assist you in the future.   These are the goals we discussed: Goals    . Patient Stated     Increase my physical activity by being more consistent with walking more routinely in the park and do the PT exercises I have learned at home.       This is a list of the screening recommended for you and due dates:  Health Maintenance  Topic Date Due  . Flu Shot  01/15/2018  . Tetanus Vaccine  08/25/2018  . DEXA scan (bone density measurement)  11/14/2018  . Mammogram  11/08/2019  . Colon Cancer Screening  11/24/2024  .  Hepatitis C: One time screening is recommended by Center for Disease Control  (CDC) for  adults born from [birth year] through 1965.   Completed  . Pneumonia vaccines  Completed   Health Maintenance, Female Adopting a healthy lifestyle and getting preventive care can go a long way to promote health and wellness. Talk with your health care provider about what schedule of regular examinations is right for you. This is a good chance for you to check in with your provider about disease prevention and staying healthy. In between checkups, there are plenty  of things you can do on your own. Experts have done a lot of research about which lifestyle changes and preventive measures are most likely to keep you healthy. Ask your health care provider for more information. Weight and diet Eat a healthy diet  Be sure to include plenty of vegetables, fruits, low-fat dairy products, and lean protein.  Do not eat a lot of foods high in solid fats, added sugars, or salt.  Get regular exercise. This is one of the most important things you can do for your health. ? Most adults should exercise for at least 150 minutes each week. The exercise should increase your heart rate and make you sweat (moderate-intensity exercise). ? Most adults should also do strengthening exercises at least twice a week. This is in addition to the moderate-intensity exercise.  Maintain a healthy weight  Body mass index (BMI) is a measurement that can be used to identify possible weight problems. It estimates body fat based on height and weight. Your health care provider can help determine your BMI and help you achieve or maintain a healthy weight.  For females 68 years of age and older: ? A BMI below 18.5 is considered underweight. ? A BMI of 18.5 to 24.9 is normal. ? A BMI of 25 to 29.9 is considered overweight. ? A BMI of 30 and above is considered obese.  Watch levels of cholesterol and blood lipids  You should start having your blood tested for lipids and cholesterol at 68 years of age, then have  this test every 5 years.  You may need to have your cholesterol levels checked more often if: ? Your lipid or cholesterol levels are high. ? You are older than 68 years of age. ? You are at high risk for heart disease.  Cancer screening Lung Cancer  Lung cancer screening is recommended for adults 58-32 years old who are at high risk for lung cancer because of a history of smoking.  A yearly low-dose CT scan of the lungs is recommended for people who: ? Currently  smoke. ? Have quit within the past 15 years. ? Have at least a 30-pack-year history of smoking. A pack year is smoking an average of one pack of cigarettes a day for 1 year.  Yearly screening should continue until it has been 15 years since you quit.  Yearly screening should stop if you develop a health problem that would prevent you from having lung cancer treatment.  Breast Cancer  Practice breast self-awareness. This means understanding how your breasts normally appear and feel.  It also means doing regular breast self-exams. Let your health care provider know about any changes, no matter how small.  If you are in your 20s or 30s, you should have a clinical breast exam (CBE) by a health care provider every 1-3 years as part of a regular health exam.  If you are 20 or older, have a CBE every year. Also consider having a breast X-ray (mammogram) every year.  If you have a family history of breast cancer, talk to your health care provider about genetic screening.  If you are at high risk for breast cancer, talk to your health care provider about having an MRI and a mammogram every year.  Breast cancer gene (BRCA) assessment is recommended for women who have family members with BRCA-related cancers. BRCA-related cancers include: ? Breast. ? Ovarian. ? Tubal. ? Peritoneal cancers.  Results of the assessment will determine the need for genetic counseling and BRCA1 and BRCA2 testing.  Cervical Cancer Your health care provider may recommend that you be screened regularly for cancer of the pelvic organs (ovaries, uterus, and vagina). This screening involves a pelvic examination, including checking for microscopic changes to the surface of your cervix (Pap test). You may be encouraged to have this screening done every 3 years, beginning at age 15.  For women ages 30-65, health care providers may recommend pelvic exams and Pap testing every 3 years, or they may recommend the Pap and pelvic  exam, combined with testing for human papilloma virus (HPV), every 5 years. Some types of HPV increase your risk of cervical cancer. Testing for HPV may also be done on women of any age with unclear Pap test results.  Other health care providers may not recommend any screening for nonpregnant women who are considered low risk for pelvic cancer and who do not have symptoms. Ask your health care provider if a screening pelvic exam is right for you.  If you have had past treatment for cervical cancer or a condition that could lead to cancer, you need Pap tests and screening for cancer for at least 20 years after your treatment. If Pap tests have been discontinued, your risk factors (such as having a new sexual partner) need to be reassessed to determine if screening should resume. Some women have medical problems that increase the chance of getting cervical cancer. In these cases, your health care provider may recommend more frequent screening and Pap tests.  Colorectal Cancer  This  type of cancer can be detected and often prevented.  Routine colorectal cancer screening usually begins at 68 years of age and continues through 68 years of age.  Your health care provider may recommend screening at an earlier age if you have risk factors for colon cancer.  Your health care provider may also recommend using home test kits to check for hidden blood in the stool.  A small camera at the end of a tube can be used to examine your colon directly (sigmoidoscopy or colonoscopy). This is done to check for the earliest forms of colorectal cancer.  Routine screening usually begins at age 36.  Direct examination of the colon should be repeated every 5-10 years through 68 years of age. However, you may need to be screened more often if early forms of precancerous polyps or small growths are found.  Skin Cancer  Check your skin from head to toe regularly.  Tell your health care provider about any new moles or  changes in moles, especially if there is a change in a mole's shape or color.  Also tell your health care provider if you have a mole that is larger than the size of a pencil eraser.  Always use sunscreen. Apply sunscreen liberally and repeatedly throughout the day.  Protect yourself by wearing long sleeves, pants, a wide-brimmed hat, and sunglasses whenever you are outside.  Heart disease, diabetes, and high blood pressure  High blood pressure causes heart disease and increases the risk of stroke. High blood pressure is more likely to develop in: ? People who have blood pressure in the high end of the normal range (130-139/85-89 mm Hg). ? People who are overweight or obese. ? People who are African American.  If you are 54-72 years of age, have your blood pressure checked every 3-5 years. If you are 22 years of age or older, have your blood pressure checked every year. You should have your blood pressure measured twice-once when you are at a hospital or clinic, and once when you are not at a hospital or clinic. Record the average of the two measurements. To check your blood pressure when you are not at a hospital or clinic, you can use: ? An automated blood pressure machine at a pharmacy. ? A home blood pressure monitor.  If you are between 63 years and 7 years old, ask your health care provider if you should take aspirin to prevent strokes.  Have regular diabetes screenings. This involves taking a blood sample to check your fasting blood sugar level. ? If you are at a normal weight and have a low risk for diabetes, have this test once every three years after 68 years of age. ? If you are overweight and have a high risk for diabetes, consider being tested at a younger age or more often. Preventing infection Hepatitis B  If you have a higher risk for hepatitis B, you should be screened for this virus. You are considered at high risk for hepatitis B if: ? You were born in a country where  hepatitis B is common. Ask your health care provider which countries are considered high risk. ? Your parents were born in a high-risk country, and you have not been immunized against hepatitis B (hepatitis B vaccine). ? You have HIV or AIDS. ? You use needles to inject street drugs. ? You live with someone who has hepatitis B. ? You have had sex with someone who has hepatitis B. ? You get hemodialysis treatment. ?  You take certain medicines for conditions, including cancer, organ transplantation, and autoimmune conditions.  Hepatitis C  Blood testing is recommended for: ? Everyone born from [birth year] through 1966. ? Anyone with known risk factors for hepatitis C.  Sexually transmitted infections (STIs)  You should be screened for sexually transmitted infections (STIs) including gonorrhea and chlamydia if: ? You are sexually active and are younger than 68 years of age. ? You are older than 68 years of age and your health care provider tells you that you are at risk for this type of infection. ? Your sexual activity has changed since you were last screened and you are at an increased risk for chlamydia or gonorrhea. Ask your health care provider if you are at risk.  If you do not have HIV, but are at risk, it may be recommended that you take a prescription medicine daily to prevent HIV infection. This is called pre-exposure prophylaxis (PrEP). You are considered at risk if: ? You are sexually active and do not regularly use condoms or know the HIV status of your partner(s). ? You take drugs by injection. ? You are sexually active with a partner who has HIV.  Talk with your health care provider about whether you are at high risk of being infected with HIV. If you choose to begin PrEP, you should first be tested for HIV. You should then be tested every 3 months for as long as you are taking PrEP. Pregnancy  If you are premenopausal and you may become pregnant, ask your health care provider  about preconception counseling.  If you may become pregnant, take 400 to 800 micrograms (mcg) of folic acid every day.  If you want to prevent pregnancy, talk to your health care provider about birth control (contraception). Osteoporosis and menopause  Osteoporosis is a disease in which the bones lose minerals and strength with aging. This can result in serious bone fractures. Your risk for osteoporosis can be identified using a bone density scan.  If you are 38 years of age or older, or if you are at risk for osteoporosis and fractures, ask your health care provider if you should be screened.  Ask your health care provider whether you should take a calcium or vitamin D supplement to lower your risk for osteoporosis.  Menopause may have certain physical symptoms and risks.  Hormone replacement therapy may reduce some of these symptoms and risks. Talk to your health care provider about whether hormone replacement therapy is right for you. Follow these instructions at home:  Schedule regular health, dental, and eye exams.  Stay current with your immunizations.  Do not use any tobacco products including cigarettes, chewing tobacco, or electronic cigarettes.  If you are pregnant, do not drink alcohol.  If you are breastfeeding, limit how much and how often you drink alcohol.  Limit alcohol intake to no more than 1 drink per day for nonpregnant women. One drink equals 12 ounces of beer, 5 ounces of wine, or 1 ounces of hard liquor.  Do not use street drugs.  Do not share needles.  Ask your health care provider for help if you need support or information about quitting drugs.  Tell your health care provider if you often feel depressed.  Tell your health care provider if you have ever been abused or do not feel safe at home. This information is not intended to replace advice given to you by your health care provider. Make sure you discuss any questions you have  with your health care  provider. Document Released: 12/17/2010 Document Revised: 11/09/2015 Document Reviewed: 03/07/2015 Elsevier Interactive Patient Education  [employer].

## 2017-12-12 NOTE — Progress Notes (Addendum)
Subjective:   Shirley Melendez is a 67 y.o. female who presents for Medicare Annual (Subsequent) preventive examination.  Review of Systems:  No ROS.  Medicare Wellness Visit. Additional risk factors are reflected in the social history.  Cardiac Risk Factors include: advanced age (>8men, >35 women);dyslipidemia Sleep patterns: feels rested on waking, gets up 1 times nightly to void and sleeps7-8 hours nightly.    Home Safety/Smoke Alarms: Feels safe in home. Smoke alarms in place.  Living environment; residence and Firearm Safety: 1-story house/ trailer, no firearms. Lives with husband, no needs for DME, good support system Seat Belt Safety/Bike Helmet: Wears seat belt.      Objective:     Vitals: BP (!) 112/58   Pulse 61   Resp 18   Ht 5' (1.524 m)   Wt 110 lb (49.9 kg)   SpO2 99%   BMI 21.48 kg/m   Body mass index is 21.48 kg/m.  Advanced Directives 12/12/2017 07/31/2017 07/08/2016 01/08/2016 01/05/2015 11/25/2014 11/11/2014  Does Patient Have a Medical Advance Directive? Yes Yes Yes Yes Yes Yes Yes  Type of Advance Directive Living will Living will Living will Living will [place];Living will - Living will  Does patient want to make changes to medical advance directive? Yes (ED - Information included in AVS) No - Patient declined - - No - Patient declined - -  Copy of [place] in Chart? - - - Yes Yes - -    Tobacco Social History   Tobacco Use  Smoking Status Never Smoker  Smokeless Tobacco Never Used     Counseling given: Not Answered  Past Medical History:  Diagnosis Date  . Arthritis    chronic neck and back pain  . Breast cancer ([place]) 1997   surgery, chemo, & radiation  . Breast cancer ([facility]) 2012   surgery, chemo, & radiation with axillary dissection  . CTS (carpal tunnel syndrome) 2013   Dr [name] , Neurology  . Fasting hyperglycemia 2012   FBS 110  . Hyperlipidemia   . Hypertension   . 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 MRSA (methicillin resistant staph aureus) culture positive 2012   swab was positive, treated per protocol.  No other issues.  . Osteopenia   . Personal history of chemotherapy 1997  . Personal history of radiation therapy 1997   x2  . PONV (postoperative nausea and vomiting) 1997   no problems since   Past Surgical History:  Procedure Laterality Date  . AXILLARY NODE DISSECTION  08/2010   left  . AXILLARY NODE DISSECTION Left 2012   with axillary dissection on left  . BREAST BIOPSY  2012   x2  . BREAST LUMPECTOMY  09/1995, 10/1995   left - lumpectomy   . BREAST LUMPECTOMY  2012   Lumpectomy Axilla   . BREAST SURGERY  2012    axillary node dissection left  . CARPAL TUNNEL RELEASE  10/29/2011   Procedure: CARPAL TUNNEL RELEASE;  Surgeon: Cammie Sickle., MD;  Location: [facility];  Service: Orthopedics;  Laterality: Right;  . COLONOSCOPY  2005   negative  . DILATION AND CURETTAGE, DIAGNOSTIC / THERAPEUTIC  2002  . KNEE SURGERY  2000   left  . TRIGGER FINGER RELEASE  10/29/2011   Procedure: RELEASE TRIGGER FINGER/A-1 PULLEY;  Surgeon: Cammie Sickle., MD;  Location: [facility];  Service: Orthopedics;  Laterality: Right;  right long and right index    Family History  Problem Relation Age of Onset  . Hypertension Mother   . Arthritis Mother        OA  . Arthritis Maternal Grandfather        OA  . Heart attack Maternal Grandfather 87  . Heart attack Maternal Uncle 65  . Stroke Neg Hx   . Diabetes Neg Hx   . Breast cancer Neg Hx    Social History   Socioeconomic History  . Marital status: Married    Spouse name: Not on file  . Number of children: 1  . Years of education: Not on file  . Highest education level: Not on file  Occupational History  . Not on file  Social Needs  . Financial resource strain: Not hard at all  . Food insecurity:    Worry: Never true     Inability: Never true  . Transportation needs:    Medical: No    Non-medical: No  Tobacco Use  . Smoking status: Never Smoker  . Smokeless tobacco: Never Used  Substance and Sexual Activity  . Alcohol use: No    Alcohol/week: 0.0 oz  . Drug use: No  . Sexual activity: Not on file  Lifestyle  . Physical activity:    Days per week: 4 days    Minutes per session: 60 min  . Stress: Not at all  Relationships  . Social connections:    Talks on phone: More than three times a week    Gets together: More than three times a week    Attends religious service: More than 4 times per year    Active member of club or organization: Yes    Attends meetings of clubs or organizations: More than 4 times per year    Relationship status: Married  Other Topics Concern  . Not on file  Social History Narrative   Retired Licensed conveyancer      Exercise: walking    Outpatient Encounter Medications as of 12/12/2017  Medication Sig  . anastrozole (ARIMIDEX) 1 MG tablet TAKE ONE TABLET BY MOUTH DAILY  . aspirin 81 MG tablet Take 81 mg by mouth daily.    . Calcium Carbonate-Vitamin D (CALCIUM 600 + D PO) Take 1 capsule by mouth 2 (two) times daily. 600/400  . DULoxetine (CYMBALTA) 60 MG capsule Take 60 mg by mouth daily.  Marland Kitchen gabapentin (NEURONTIN) 100 MG capsule Take 2 capsules (200 mg total) by mouth at bedtime.  Marland Kitchen ibuprofen (ADVIL,MOTRIN) 200 MG tablet Take 200 mg by mouth every 6 (six) hours as needed.    Marland Kitchen LYSINE PO Take 500 mg by mouth as needed.    . meloxicam (MOBIC) 15 MG tablet Take 1 tablet (15 mg total) by mouth daily.  . methocarbamol (ROBAXIN) 500 MG tablet Take 1 tablet (500 mg total) by mouth every 8 (eight) hours as needed for muscle spasms.  . metoprolol tartrate (LOPRESSOR) 50 MG tablet Take 1 tablet (50 mg total) by mouth 2 (two) times daily. -- Office visit needed for further refills  . Multiple Vitamins-Minerals (SENIOR MULTIVITAMIN PLUS PO) Take by mouth daily.    . TRAZODONE HCL PO Take 1  tablet by mouth at bedtime.  . Vitamin E 200 UNITS TABS Take 400 Units by mouth daily.   Marland Kitchen spironolactone (ALDACTONE) 25 MG tablet Take 1 tablet (25 mg total) by mouth once.  . [DISCONTINUED] oxyCODONE-acetaminophen (PERCOCET) 5-325 MG per tablet Take 1 tablet by mouth  daily as needed.   No facility-administered encounter medications on file as of 12/12/2017.     Activities of Daily Living In your present state of health, do you have any difficulty performing the following activities: 12/12/2017  Hearing? N  Vision? N  Difficulty concentrating or making decisions? N  Walking or climbing stairs? N  Dressing or bathing? N  Doing errands, shopping? N  Preparing Food and eating ? N  Using the Toilet? N  In the past six months, have you accidently leaked urine? N  Do you have problems with loss of bowel control? N  Managing your Medications? N  Managing your Finances? N  Housekeeping or managing your Housekeeping? N  Some recent data might be hidden    Patient Care Team: [name], MD as PCP - General (Internal Medicine) Ladell Pier, MD as Attending Physician (Internal Medicine) Arloa Koh, MD (Radiation Oncology)    Assessment:   This is a routine wellness examination for Brittane. Physical assessment deferred to PCP.   Exercise Activities and Dietary recommendations Current Exercise Habits: Home exercise routine, Type of exercise: walking, Time (Minutes): 40, Frequency (Times/Week): 4, Weekly Exercise (Minutes/Week): 160, Intensity: Mild, Exercise limited by: orthopedic condition(s) Diet (meal preparation, eat out, water intake, caffeinated beverages, dairy products, fruits and vegetables): in general, a "healthy" diet  , well balanced   Reviewed heart healthy diet. Encouraged patient to increase daily water and fluid intake.    Goals    . Patient Stated     Increase my physical activity by being more consistent with walking more routinely in the park and do the PT  exercises I have learned at home.       Fall Risk Fall Risk  12/12/2017 12/11/2016 10/20/2014 07/07/2014  Falls in the past year? No No No No    Depression Screen PHQ 2/9 Scores 12/12/2017 12/11/2016 12/11/2016 10/20/2014  PHQ - 2 Score 0 0 0 0  PHQ- 9 Score 0 1 - -     Cognitive Function MMSE - Mini Mental State Exam 12/12/2017  Orientation to time 5  Orientation to Place 5  Registration 3  Attention/ Calculation 5  Recall 2  Language- name 2 objects 2  Language- repeat 1  Language- follow 3 step command 3  Language- read & follow direction 1  Write a sentence 1  Copy design 1  Total score 29        Immunization History  Administered Date(s) Administered  . Influenza Split 03/18/2011  . Influenza Whole 04/01/2007, 03/10/2008  . Influenza, High Dose Seasonal PF 02/23/2016, 04/01/2017  . Influenza-Unspecified 02/15/2013, 02/15/2014  . Pneumococcal Conjugate-13 10/10/2015  . Pneumococcal Polysaccharide-23 12/11/2016  . Td 08/24/2008   Screening Tests Health Maintenance  Topic Date Due  . INFLUENZA VACCINE  01/15/2018  . TETANUS/TDAP  08/25/2018  . DEXA SCAN  11/14/2018  . MAMMOGRAM  11/08/2019  . COLONOSCOPY  11/24/2024  . Hepatitis C Screening  Completed  . PNA vac Low Risk Adult  Completed      Plan:   Continue doing brain stimulating activities (puzzles, reading, adult coloring books, staying active) to keep memory sharp.   Continue to eat heart healthy diet (full of fruits, vegetables, whole grains, lean protein, water--limit salt, fat, and sugar intake) and increase physical activity as tolerated.   I have personally reviewed and noted the following in the patient's chart:   . Medical and social history . Use of alcohol, tobacco or illicit drugs  . Current  medications and supplements . Functional ability and status . Nutritional status . Physical activity . Advanced directives . List of other physicians . Vitals . Screenings to include cognitive,  depression, and falls . Referrals and appointments  In addition, I have reviewed and discussed with patient certain preventive protocols, quality metrics, and best practice recommendations. A written personalized care plan for preventive services as well as general preventive health recommendations were provided to patient.     Michiel Cowboy, RN  12/12/2017   Medical screening examination/treatment/procedure(s) were performed by non-physician practitioner and as supervising physician I was immediately available for consultation/collaboration. I agree with above. [name], MD

## 2017-12-15 ENCOUNTER — Other Ambulatory Visit (INDEPENDENT_AMBULATORY_CARE_PROVIDER_SITE_OTHER): Payer: Medicare Other

## 2017-12-15 DIAGNOSIS — R739 Hyperglycemia, unspecified: Secondary | ICD-10-CM

## 2017-12-15 DIAGNOSIS — I10 Essential (primary) hypertension: Secondary | ICD-10-CM

## 2017-12-15 DIAGNOSIS — Z0000 Encounter for general adult medical examination without abnormal findings: Secondary | ICD-10-CM | POA: Diagnosis not present

## 2017-12-15 DIAGNOSIS — E7800 Pure hypercholesterolemia, unspecified: Secondary | ICD-10-CM | POA: Diagnosis not present

## 2017-12-15 LAB — CBC WITH DIFFERENTIAL/PLATELET
BASOS PCT: 0.6 % (ref 0.0–3.0)
Basophils Absolute: 0 10*3/uL (ref 0.0–0.1)
Eosinophils Absolute: 0.2 10*3/uL (ref 0.0–0.7)
Eosinophils Relative: 2.9 % (ref 0.0–5.0)
HEMATOCRIT: 42.8 % (ref 36.0–46.0)
Hemoglobin: 14.6 g/dL (ref 12.0–15.0)
LYMPHS ABS: 1.5 10*3/uL (ref 0.7–4.0)
LYMPHS PCT: 23.1 % (ref 12.0–46.0)
MCHC: 34.1 g/dL (ref 30.0–36.0)
MCV: 93.7 fl (ref 78.0–100.0)
MONOS PCT: 11.8 % (ref 3.0–12.0)
Monocytes Absolute: 0.8 10*3/uL (ref 0.1–1.0)
NEUTROS ABS: 4 10*3/uL (ref 1.4–7.7)
Neutrophils Relative %: 61.6 % (ref 43.0–77.0)
Platelets: 264 10*3/uL (ref 150.0–400.0)
RBC: 4.57 Mil/uL (ref 3.87–5.11)
RDW: 13.1 % (ref 11.5–15.5)
WBC: 6.5 10*3/uL (ref 4.0–10.5)

## 2017-12-15 LAB — COMPREHENSIVE METABOLIC PANEL
ALT: 15 U/L (ref 0–35)
AST: 13 U/L (ref 0–37)
Albumin: 4.2 g/dL (ref 3.5–5.2)
Alkaline Phosphatase: 58 U/L (ref 39–117)
BILIRUBIN TOTAL: 0.6 mg/dL (ref 0.2–1.2)
BUN: 17 mg/dL (ref 6–23)
CALCIUM: 10.2 mg/dL (ref 8.4–10.5)
CHLORIDE: 105 meq/L (ref 96–112)
CO2: 32 meq/L (ref 19–32)
Creatinine, Ser: 1.03 mg/dL (ref 0.40–1.20)
GFR: 56.6 mL/min — AB (ref >60.00)
GLUCOSE: 104 mg/dL — AB (ref 70–99)
Potassium: 4.9 mEq/L (ref 3.5–5.1)
Sodium: 142 mEq/L (ref 135–145)
Total Protein: 6.5 g/dL (ref 6.0–8.3)

## 2017-12-15 LAB — LIPID PANEL
CHOL/HDL RATIO: 3
CHOLESTEROL: 227 mg/dL — AB (ref 0–200)
HDL: 83.3 mg/dL (ref >39.00)
LDL CALC: 128 mg/dL — AB (ref 0–99)
NONHDL: 144.16
Triglycerides: 79 mg/dL (ref 0.0–149.0)
VLDL: 15.8 mg/dL (ref 0.0–40.0)

## 2017-12-15 LAB — TSH: TSH: 3.16 u[IU]/mL (ref 0.35–4.50)

## 2017-12-15 LAB — HEMOGLOBIN A1C: HEMOGLOBIN A1C: 5.8 % (ref 4.6–6.5)

## 2017-12-15 NOTE — Patient Instructions (Addendum)
Your blood work was reviewed.    All other Health Maintenance issues reviewed.   All recommended immunizations and age-appropriate screenings are up-to-date or discussed.  No immunizations administered today.   Medications reviewed and updated.  No changes recommended at this time.  Your prescription(s) have been submitted to your pharmacy. Please take as directed and contact our office if you believe you are having problem(s) with the medication(s).   Please followup in one year   Health Maintenance, Female Adopting a healthy lifestyle and getting preventive care can go a long way to promote health and wellness. Talk with your health care provider about what schedule of regular examinations is right for you. This is a good chance for you to check in with your provider about disease prevention and staying healthy. In between checkups, there are plenty of things you can do on your own. Experts have done a lot of research about which lifestyle changes and preventive measures are most likely to keep you healthy. Ask your health care provider for more information. Weight and diet Eat a healthy diet  Be sure to include plenty of vegetables, fruits, low-fat dairy products, and lean protein.  Do not eat a lot of foods high in solid fats, added sugars, or salt.  Get regular exercise. This is one of the most important things you can do for your health. ? Most adults should exercise for at least 150 minutes each week. The exercise should increase your heart rate and make you sweat (moderate-intensity exercise). ? Most adults should also do strengthening exercises at least twice a week. This is in addition to the moderate-intensity exercise.  Maintain a healthy weight  Body mass index (BMI) is a measurement that can be used to identify possible weight problems. It estimates body fat based on height and weight. Your health care provider can help determine your BMI and help you achieve or maintain  a healthy weight.  For females 36 years of age and older: ? A BMI below 18.5 is considered underweight. ? A BMI of 18.5 to 24.9 is normal. ? A BMI of 25 to 29.9 is considered overweight. ? A BMI of 30 and above is considered obese.  Watch levels of cholesterol and blood lipids  You should start having your blood tested for lipids and cholesterol at 68 years of age, then have this test every 5 years.  You may need to have your cholesterol levels checked more often if: ? Your lipid or cholesterol levels are high. ? You are older than 68 years of age. ? You are at high risk for heart disease.  Cancer screening Lung Cancer  Lung cancer screening is recommended for adults 72-18 years old who are at high risk for lung cancer because of a history of smoking.  A yearly low-dose CT scan of the lungs is recommended for people who: ? Currently smoke. ? Have quit within the past 15 years. ? Have at least a 30-pack-year history of smoking. A pack year is smoking an average of one pack of cigarettes a day for 1 year.  Yearly screening should continue until it has been 15 years since you quit.  Yearly screening should stop if you develop a health problem that would prevent you from having lung cancer treatment.  Breast Cancer  Practice breast self-awareness. This means understanding how your breasts normally appear and feel.  It also means doing regular breast self-exams. Let your health care provider know about any changes, no matter how  small.  If you are in your 20s or 30s, you should have a clinical breast exam (CBE) by a health care provider every 1-3 years as part of a regular health exam.  If you are 40 or older, have a CBE every year. Also consider having a breast X-ray (mammogram) every year.  If you have a family history of breast cancer, talk to your health care provider about genetic screening.  If you are at high risk for breast cancer, talk to your health care provider about  having an MRI and a mammogram every year.  Breast cancer gene (BRCA) assessment is recommended for women who have family members with BRCA-related cancers. BRCA-related cancers include: ? Breast. ? Ovarian. ? Tubal. ? Peritoneal cancers.  Results of the assessment will determine the need for genetic counseling and BRCA1 and BRCA2 testing.  Cervical Cancer Your health care provider may recommend that you be screened regularly for cancer of the pelvic organs (ovaries, uterus, and vagina). This screening involves a pelvic examination, including checking for microscopic changes to the surface of your cervix (Pap test). You may be encouraged to have this screening done every 3 years, beginning at age 21.  For women ages 30-65, health care providers may recommend pelvic exams and Pap testing every 3 years, or they may recommend the Pap and pelvic exam, combined with testing for human papilloma virus (HPV), every 5 years. Some types of HPV increase your risk of cervical cancer. Testing for HPV may also be done on women of any age with unclear Pap test results.  Other health care providers may not recommend any screening for nonpregnant women who are considered low risk for pelvic cancer and who do not have symptoms. Ask your health care provider if a screening pelvic exam is right for you.  If you have had past treatment for cervical cancer or a condition that could lead to cancer, you need Pap tests and screening for cancer for at least 20 years after your treatment. If Pap tests have been discontinued, your risk factors (such as having a new sexual partner) need to be reassessed to determine if screening should resume. Some women have medical problems that increase the chance of getting cervical cancer. In these cases, your health care provider may recommend more frequent screening and Pap tests.  Colorectal Cancer  This type of cancer can be detected and often prevented.  Routine colorectal cancer  screening usually begins at 68 years of age and continues through 68 years of age.  Your health care provider may recommend screening at an earlier age if you have risk factors for colon cancer.  Your health care provider may also recommend using home test kits to check for hidden blood in the stool.  A small camera at the end of a tube can be used to examine your colon directly (sigmoidoscopy or colonoscopy). This is done to check for the earliest forms of colorectal cancer.  Routine screening usually begins at age 50.  Direct examination of the colon should be repeated every 5-10 years through 68 years of age. However, you may need to be screened more often if early forms of precancerous polyps or small growths are found.  Skin Cancer  Check your skin from head to toe regularly.  Tell your health care provider about any new moles or changes in moles, especially if there is a change in a mole's shape or color.  Also tell your health care provider if you have a mole   that is larger than the size of a pencil eraser.  Always use sunscreen. Apply sunscreen liberally and repeatedly throughout the day.  Protect yourself by wearing long sleeves, pants, a wide-brimmed hat, and sunglasses whenever you are outside.  Heart disease, diabetes, and high blood pressure  High blood pressure causes heart disease and increases the risk of stroke. High blood pressure is more likely to develop in: ? People who have blood pressure in the high end of the normal range (130-139/85-89 mm Hg). ? People who are overweight or obese. ? People who are African American.  If you are 18-39 years of age, have your blood pressure checked every 3-5 years. If you are 40 years of age or older, have your blood pressure checked every year. You should have your blood pressure measured twice-once when you are at a hospital or clinic, and once when you are not at a hospital or clinic. Record the average of the two measurements.  To check your blood pressure when you are not at a hospital or clinic, you can use: ? An automated blood pressure machine at a pharmacy. ? A home blood pressure monitor.  If you are between 55 years and 79 years old, ask your health care provider if you should take aspirin to prevent strokes.  Have regular diabetes screenings. This involves taking a blood sample to check your fasting blood sugar level. ? If you are at a normal weight and have a low risk for diabetes, have this test once every three years after 68 years of age. ? If you are overweight and have a high risk for diabetes, consider being tested at a younger age or more often. Preventing infection Hepatitis B  If you have a higher risk for hepatitis B, you should be screened for this virus. You are considered at high risk for hepatitis B if: ? You were born in a country where hepatitis B is common. Ask your health care provider which countries are considered high risk. ? Your parents were born in a high-risk country, and you have not been immunized against hepatitis B (hepatitis B vaccine). ? You have HIV or AIDS. ? You use needles to inject street drugs. ? You live with someone who has hepatitis B. ? You have had sex with someone who has hepatitis B. ? You get hemodialysis treatment. ? You take certain medicines for conditions, including cancer, organ transplantation, and autoimmune conditions.  Hepatitis C  Blood testing is recommended for: ? Everyone born from 1945 through 1965. ? Anyone with known risk factors for hepatitis C.  Sexually transmitted infections (STIs)  You should be screened for sexually transmitted infections (STIs) including gonorrhea and chlamydia if: ? You are sexually active and are younger than 68 years of age. ? You are older than 68 years of age and your health care provider tells you that you are at risk for this type of infection. ? Your sexual activity has changed since you were last screened  and you are at an increased risk for chlamydia or gonorrhea. Ask your health care provider if you are at risk.  If you do not have HIV, but are at risk, it may be recommended that you take a prescription medicine daily to prevent HIV infection. This is called pre-exposure prophylaxis (PrEP). You are considered at risk if: ? You are sexually active and do not regularly use condoms or know the HIV status of your partner(s). ? You take drugs by injection. ? You are   sexually active with a partner who has HIV.  Talk with your health care provider about whether you are at high risk of being infected with HIV. If you choose to begin PrEP, you should first be tested for HIV. You should then be tested every 3 months for as long as you are taking PrEP. Pregnancy  If you are premenopausal and you may become pregnant, ask your health care provider about preconception counseling.  If you may become pregnant, take 400 to 800 micrograms (mcg) of folic acid every day.  If you want to prevent pregnancy, talk to your health care provider about birth control (contraception). Osteoporosis and menopause  Osteoporosis is a disease in which the bones lose minerals and strength with aging. This can result in serious bone fractures. Your risk for osteoporosis can be identified using a bone density scan.  If you are 65 years of age or older, or if you are at risk for osteoporosis and fractures, ask your health care provider if you should be screened.  Ask your health care provider whether you should take a calcium or vitamin D supplement to lower your risk for osteoporosis.  Menopause may have certain physical symptoms and risks.  Hormone replacement therapy may reduce some of these symptoms and risks. Talk to your health care provider about whether hormone replacement therapy is right for you. Follow these instructions at home:  Schedule regular health, dental, and eye exams.  Stay current with your  immunizations.  Do not use any tobacco products including cigarettes, chewing tobacco, or electronic cigarettes.  If you are pregnant, do not drink alcohol.  If you are breastfeeding, limit how much and how often you drink alcohol.  Limit alcohol intake to no more than 1 drink per day for nonpregnant women. One drink equals 12 ounces of beer, 5 ounces of wine, or 1 ounces of hard liquor.  Do not use street drugs.  Do not share needles.  Ask your health care provider for help if you need support or information about quitting drugs.  Tell your health care provider if you often feel depressed.  Tell your health care provider if you have ever been abused or do not feel safe at home. This information is not intended to replace advice given to you by your health care provider. Make sure you discuss any questions you have with your health care provider. Document Released: 12/17/2010 Document Revised: 11/09/2015 Document Reviewed: 03/07/2015 [employer] Interactive Patient Education  2018 [employer]  

## 2017-12-15 NOTE — Progress Notes (Signed)
Subjective:    Patient ID: Shirley Melendez, female    DOB: [birth year], 67 y.o.   MRN: [id number]  HPI She is here for a physical exam.   She feels good and has no questions, except the shingles vaccine.    She had blood work done and would like to review it.    Medications and allergies reviewed with patient and updated if appropriate.  Patient Active Problem List   Diagnosis Date Noted  . Sciatica of left side 07/25/2017  . Hyperglycemia 12/11/2016  . Essential hypertension, benign 07/16/2013  . Lymphedema of upper extremity following lymphadenectomy 11/07/2011  . Nonspecific abnormal electrocardiogram (ECG) (EKG) 09/27/2011  . Cervicalgia 04/12/2009  . BACK PAIN, LUMBAR 04/12/2009  . Hyperlipidemia 08/24/2008  . Osteoarthritis 08/24/2008  . Osteopenia 08/24/2008  . hx: breast cancer, left, invasive ductal carcinoma with axillary mets, receptor + 08/24/2008    Current Outpatient Medications on File Prior to Visit  Medication Sig Dispense Refill  . anastrozole (ARIMIDEX) 1 MG tablet TAKE ONE TABLET BY MOUTH DAILY 90 tablet 2  . aspirin 81 MG tablet Take 81 mg by mouth daily.      . Calcium Carbonate-Vitamin D (CALCIUM 600 + D PO) Take 1 capsule by mouth 2 (two) times daily. 600/400    . DULoxetine (CYMBALTA) 60 MG capsule Take 60 mg by mouth daily.    Marland Kitchen ibuprofen (ADVIL,MOTRIN) 200 MG tablet Take 200 mg by mouth every 6 (six) hours as needed.      Marland Kitchen LYSINE PO Take 500 mg by mouth as needed.      . metoprolol tartrate (LOPRESSOR) 50 MG tablet Take 1 tablet (50 mg total) by mouth 2 (two) times daily. -- Office visit needed for further refills 180 tablet 0  . Multiple Vitamins-Minerals (SENIOR MULTIVITAMIN PLUS PO) Take by mouth daily.      . TRAZODONE HCL PO Take 1 tablet by mouth at bedtime.    . Vitamin E 200 UNITS TABS Take 400 Units by mouth daily.      No current facility-administered medications on file prior to visit.     Past Medical History:  Diagnosis Date  .  Arthritis    chronic neck and back pain  . Breast cancer ([place]) 1997   surgery, chemo, & radiation  . Breast cancer ([place]) 2012   surgery, chemo, & radiation with axillary dissection  . CTS (carpal tunnel syndrome) 2013   Dr [name] , Neurology  . Fasting hyperglycemia 2012   FBS 110  . Hyperlipidemia   . Hypertension   . 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 MRSA (methicillin resistant staph aureus) culture positive 2012   swab was positive, treated per protocol.  No other issues.  . Osteopenia   . Personal history of chemotherapy 1997  . Personal history of radiation therapy 1997   x2  . PONV (postoperative nausea and vomiting) 1997   no problems since    Past Surgical History:  Procedure Laterality Date  . AXILLARY NODE DISSECTION  08/2010   left  . AXILLARY NODE DISSECTION Left 2012   with axillary dissection on left  . BREAST BIOPSY  2012   x2  . BREAST LUMPECTOMY  09/1995, 10/1995   left - lumpectomy   . BREAST LUMPECTOMY  2012   Lumpectomy Axilla   . BREAST SURGERY  2012    axillary node dissection left  . CARPAL TUNNEL RELEASE  10/29/2011  Procedure: CARPAL TUNNEL RELEASE;  Surgeon: Cammie Sickle., MD;  Location: [facility];  Service: Orthopedics;  Laterality: Right;  . COLONOSCOPY  2005   negative  . DILATION AND CURETTAGE, DIAGNOSTIC / THERAPEUTIC  2002  . KNEE SURGERY  2000   left  . TRIGGER FINGER RELEASE  10/29/2011   Procedure: RELEASE TRIGGER FINGER/A-1 PULLEY;  Surgeon: Cammie Sickle., MD;  Location: [facility];  Service: Orthopedics;  Laterality: Right;  right long and right index     Social History   Socioeconomic History  . Marital status: Married    Spouse name: Not on file  . Number of children: 1  . Years of education: Not on file  . Highest education level: Not on file  Occupational History  . Not on file  Social Needs  . Financial resource strain: Not  hard at all  . Food insecurity:    Worry: Never true    Inability: Never true  . Transportation needs:    Medical: No    Non-medical: No  Tobacco Use  . Smoking status: Never Smoker  . Smokeless tobacco: Never Used  Substance and Sexual Activity  . Alcohol use: No    Alcohol/week: 0.0 oz  . Drug use: No  . Sexual activity: Not on file  Lifestyle  . Physical activity:    Days per week: 4 days    Minutes per session: 60 min  . Stress: Not at all  Relationships  . Social connections:    Talks on phone: More than three times a week    Gets together: More than three times a week    Attends religious service: More than 4 times per year    Active member of club or organization: Yes    Attends meetings of clubs or organizations: More than 4 times per year    Relationship status: Married  Other Topics Concern  . Not on file  Social History Narrative   Retired Licensed conveyancer      Exercise: walking    Family History  Problem Relation Age of Onset  . Hypertension Mother   . Arthritis Mother        OA  . Arthritis Maternal Grandfather        OA  . Heart attack Maternal Grandfather 87  . Heart attack Maternal Uncle 65  . Stroke Neg Hx   . Diabetes Neg Hx   . Breast cancer Neg Hx     Review of Systems  Constitutional: Negative for chills and fever.  Eyes: Negative for visual disturbance (intermittent blurry vision with fatigue - eye exam up to date).  Respiratory: Negative for cough, shortness of breath and wheezing.   Cardiovascular: Negative for chest pain, palpitations and leg swelling.  Gastrointestinal: Negative for abdominal pain, blood in stool, constipation, diarrhea and nausea.       No gerd  Genitourinary: Negative for dysuria and hematuria.  Musculoskeletal: Positive for back pain (left lower back pain). Negative for arthralgias.  Skin: Negative for color change and rash.  Neurological: Negative for light-headedness and headaches.  Psychiatric/Behavioral: Negative  for dysphoric mood. The patient is not nervous/anxious.        Objective:   Vitals:   12/16/17 1408  BP: 124/72  Pulse: 84  Resp: 16  Temp: 98.7 F (37.1 C)  SpO2: 98%   Filed Weights   12/16/17 1408  Weight: 110 lb (49.9 kg)   Body mass index is 21.48 kg/m.  Wt Readings from Last 3 Encounters:  12/16/17 110 lb (49.9 kg)  12/12/17 110 lb (49.9 kg)  07/25/17 108 lb (49 kg)     Physical Exam Constitutional: She appears well-developed and well-nourished. No distress.  HENT:  Head: Normocephalic and atraumatic.  Right Ear: External ear normal. Normal ear canal and TM Left Ear: External ear normal.  Normal ear canal and TM Mouth/Throat: Oropharynx is clear and moist.  Eyes: Conjunctivae and EOM are normal.  Neck: Neck supple. No tracheal deviation present. No thyromegaly present.  No carotid bruit  Cardiovascular: Normal rate, regular rhythm and normal heart sounds.   No murmur heard.  No edema. Pulmonary/Chest: Effort normal and breath sounds normal. No respiratory distress. She has no wheezes. She has no rales.  Breast: deferred to Gyn Abdominal: Soft. She exhibits no distension. There is no tenderness.  Lymphadenopathy: She has no cervical adenopathy.  Skin: Skin is warm and dry. She is not diaphoretic.  Psychiatric: She has a normal mood and affect. Her behavior is normal.        Assessment & Plan:   Physical exam: Screening blood work  reviewed Immunizations   Discussed shingles, others up to date Colonoscopy  Up to date  Mammogram   Up to date  Gyn  Not current seeing Dexa   Up to date  Eye exams  Up to date  EKG  Done 06/2013 Exercise    Doing some walking Weight    Normal BMI Skin   No concerns Substance abuse  none  See Problem List for Assessment and Plan of chronic medical problems.   FU in one year

## 2017-12-16 ENCOUNTER — Other Ambulatory Visit: Payer: Self-pay | Admitting: Internal Medicine

## 2017-12-16 ENCOUNTER — Encounter: Payer: Self-pay | Admitting: Internal Medicine

## 2017-12-16 ENCOUNTER — Ambulatory Visit (INDEPENDENT_AMBULATORY_CARE_PROVIDER_SITE_OTHER): Payer: Medicare Other | Admitting: Internal Medicine

## 2017-12-16 VITALS — BP 124/72 | HR 84 | Temp 98.7°F | Resp 16 | Wt 110.0 lb

## 2017-12-16 DIAGNOSIS — R7303 Prediabetes: Secondary | ICD-10-CM

## 2017-12-16 DIAGNOSIS — I10 Essential (primary) hypertension: Secondary | ICD-10-CM | POA: Diagnosis not present

## 2017-12-16 DIAGNOSIS — E782 Mixed hyperlipidemia: Secondary | ICD-10-CM | POA: Diagnosis not present

## 2017-12-16 DIAGNOSIS — Z0000 Encounter for general adult medical examination without abnormal findings: Secondary | ICD-10-CM

## 2017-12-16 DIAGNOSIS — M8580 Other specified disorders of bone density and structure, unspecified site: Secondary | ICD-10-CM

## 2017-12-16 DIAGNOSIS — M5432 Sciatica, left side: Secondary | ICD-10-CM

## 2017-12-16 NOTE — Assessment & Plan Note (Signed)
Improved with lifestyle No medication needed at this time Continue regular exercise

## 2017-12-16 NOTE — Assessment & Plan Note (Signed)
zometa this month by oncology Encouraged regular exercise Taking calcium and vitamin d daily

## 2017-12-16 NOTE — Assessment & Plan Note (Signed)
Did PT Still doing exercises at home  Still has some lower back pain on left side

## 2017-12-16 NOTE — Assessment & Plan Note (Signed)
Check a1c Low sugar / carb diet Stressed regular exercise   

## 2017-12-16 NOTE — Assessment & Plan Note (Signed)
BP well controlled Current regimen effective and well tolerated Continue current medications at current doses cmp normal

## 2018-01-02 ENCOUNTER — Other Ambulatory Visit: Payer: Self-pay | Admitting: Internal Medicine

## 2018-01-02 DIAGNOSIS — I10 Essential (primary) hypertension: Secondary | ICD-10-CM

## 2018-01-13 ENCOUNTER — Telehealth: Payer: Self-pay | Admitting: Oncology

## 2018-01-13 ENCOUNTER — Inpatient Hospital Stay: Payer: Medicare Other

## 2018-01-13 ENCOUNTER — Encounter: Payer: Self-pay | Admitting: Nurse Practitioner

## 2018-01-13 ENCOUNTER — Inpatient Hospital Stay: Payer: Medicare Other | Attending: Oncology | Admitting: Nurse Practitioner

## 2018-01-13 VITALS — BP 114/77 | HR 80 | Temp 99.2°F | Resp 18 | Ht 60.0 in | Wt 111.1 lb

## 2018-01-13 DIAGNOSIS — R232 Flushing: Secondary | ICD-10-CM | POA: Diagnosis not present

## 2018-01-13 DIAGNOSIS — I10 Essential (primary) hypertension: Secondary | ICD-10-CM | POA: Diagnosis not present

## 2018-01-13 DIAGNOSIS — Z79811 Long term (current) use of aromatase inhibitors: Secondary | ICD-10-CM | POA: Diagnosis not present

## 2018-01-13 DIAGNOSIS — Z170 Estrogen receptor positive status [ER+]: Secondary | ICD-10-CM | POA: Diagnosis not present

## 2018-01-13 DIAGNOSIS — G5603 Carpal tunnel syndrome, bilateral upper limbs: Secondary | ICD-10-CM | POA: Insufficient documentation

## 2018-01-13 DIAGNOSIS — C50912 Malignant neoplasm of unspecified site of left female breast: Secondary | ICD-10-CM

## 2018-01-13 DIAGNOSIS — M8580 Other specified disorders of bone density and structure, unspecified site: Secondary | ICD-10-CM | POA: Diagnosis not present

## 2018-01-13 DIAGNOSIS — Z853 Personal history of malignant neoplasm of breast: Secondary | ICD-10-CM

## 2018-01-13 DIAGNOSIS — C773 Secondary and unspecified malignant neoplasm of axilla and upper limb lymph nodes: Secondary | ICD-10-CM | POA: Insufficient documentation

## 2018-01-13 DIAGNOSIS — M899 Disorder of bone, unspecified: Secondary | ICD-10-CM

## 2018-01-13 DIAGNOSIS — M7989 Other specified soft tissue disorders: Secondary | ICD-10-CM | POA: Insufficient documentation

## 2018-01-13 DIAGNOSIS — M949 Disorder of cartilage, unspecified: Principal | ICD-10-CM

## 2018-01-13 LAB — BASIC METABOLIC PANEL - CANCER CENTER ONLY
ANION GAP: 8 (ref 5–15)
BUN: 18 mg/dL (ref 8–23)
CALCIUM: 9.5 mg/dL (ref 8.9–10.3)
CO2: 30 mmol/L (ref 22–32)
Chloride: 100 mmol/L (ref 98–111)
Creatinine: 1.02 mg/dL — ABNORMAL HIGH (ref 0.44–1.00)
GFR, Est AFR Am: >60 mL/min (ref >60)
GFR, Estimated: 55 mL/min — ABNORMAL LOW (ref >60)
GLUCOSE: 104 mg/dL — AB (ref 70–99)
POTASSIUM: 4 mmol/L (ref 3.5–5.1)
Sodium: 138 mmol/L (ref 135–145)

## 2018-01-13 MED ORDER — SODIUM CHLORIDE 0.9 % IV SOLN
INTRAVENOUS | Status: DC
  Administered 2018-01-13: 16:00:00 via INTRAVENOUS
  Filled 2018-01-13: qty 250

## 2018-01-13 MED ORDER — ZOLEDRONIC ACID 4 MG/100ML IV SOLN
4.0000 mg | Freq: Once | INTRAVENOUS | Status: AC
Start: 2018-01-13 — End: 2018-01-13
  Administered 2018-01-13: 4 mg via INTRAVENOUS
  Filled 2018-01-13: qty 100

## 2018-01-13 NOTE — Telephone Encounter (Signed)
Appointments scheduled AVS/Calendar printed per 7/30 los °

## 2018-01-13 NOTE — Patient Instructions (Signed)

## 2018-01-13 NOTE — Progress Notes (Addendum)
  [facility] OFFICE PROGRESS NOTE   Diagnosis: Breast cancer  INTERVAL HISTORY:   Shirley Melendez returns as scheduled.  She continues Arimidex.  She has an occasional hot flash.  No significant arthralgias. No interim illnesses or infections.  She has minimal swelling of the left arm.  No significant associated pain.  No bowel or bladder problems.  No nausea/vomiting.  No fever, cough or shortness of breath.  No dental issues.  Objective:  Vital signs in last 24 hours:  Blood pressure 114/77, pulse 80, temperature 99.2 F (37.3 C), temperature source Oral, resp. rate 18, height 5' (1.524 m), weight 111 lb 1.6 oz (50.4 kg), SpO2 99 %.    HEENT: No thrush or ulcers. Lymphatics: No palpable cervical, supraclavicular or axillary lymph nodes. Resp: Lungs clear bilaterally. Cardio: Regular rate and rhythm. GI: Abdomen soft and nontender.  No hepatomegaly. Vascular: No leg edema. Breast: Status post left lumpectomy.  No mass palpated in either breast.   Lab Results:  Lab Results  Component Value Date   WBC 6.5 12/15/2017   HGB 14.6 12/15/2017   HCT 42.8 12/15/2017   MCV 93.7 12/15/2017   PLT 264.0 12/15/2017   NEUTROABS 4.0 12/15/2017    Imaging:  No results found.  Medications: I have reviewed the patient's current medications.  Assessment/Plan: 1. Stage I (T1 NX) left-sided breast cancer diagnosed in April 1997. She was treated with a lumpectomy, left breast radiation, CMF chemotherapy, and 5 years of tamoxifen. 2. Metastatic carcinoma involving a left axillary lymph node, ER-positive, PR-positive, and HER-2-negative. She is status post a biopsy of a left axillary lymph node 08/13/2010. Status post a left axillary lymph node dissection 09/13/2010 with the pathology confirming 3/11 axillary lymph nodes containing metastatic carcinoma. A staging PET scan on 08/23/2010 confirmed a hypermetabolic left axillary lymph node and no other evidence of metastatic disease.   1. Status post 4 cycles of "adjuvant" T-C chemotherapy with cycle #4 given on 01/07/2011. 2. She began Arimidex on 03/26/2011 3. Left arm lymphedema, likely lymphedema related to the left axillary lymph node dissection. She is now followed at the lymphedema clinic. Improved with physical therapy and a lymphedema sleeve. 4. Pain and numbness at the fingers bilaterally-right greater than left, she reports being diagnosed with carpal tunnel syndrome and underwent a right sided carpal tunnel surgery with clinical improvement  5. Osteopenia-she received Zometa in July of 2018.bone density scanMay 2018confirmed osteopenia at the femoral neck 6. Hypertension    Disposition: Shirley Melendez remains in clinical remission from breast cancer.  She will continue Arimidex.  She is scheduled to receive Zometa today.  She will return for a follow-up visit in 6 months.  Patient seen with Dr. Benay Spice.    Ned Card ANP/GNP-BC   01/13/2018  2:48 PM This was a shared visit with Ned Card.  Shirley Melendez is in clinical remission from breast cancer.  She will continue yearly Zometa for treatment of osteopenia.  She will continue Arimidex.  Julieanne Manson, MD

## 2018-02-17 ENCOUNTER — Ambulatory Visit: Payer: Self-pay | Admitting: Internal Medicine

## 2018-02-17 NOTE — Progress Notes (Signed)
Subjective:    Patient ID: Shirley Melendez, female    DOB: 1951/06/11, 67 y.o.   MRN: [id number]  HPI The patient is here for an acute visit for not feeling right since taking trazodone and oxycodone. .   Side effect from medication-hallucinations: She follows with pain management.  She was on oxycodone and cymbalta for her nerve pain.  She stopped the oxycodone in March.  She was also prescribed trazodone for sleep.  She was on this for a few months and stopped it the middle of last month.  She had hallucinations, did not know who is husband was, blurry vision, and does not recall some of the time she was on the medication.  She is doing better now.    Over the past two weeks she denies hallucinations, confusion, memory concerns, or changes in vision.   She has never tried melatonin or anything natural or over-the-counter for her sleep.  Neuropathy secondary to chemotherapy: She is currently taking Cymbalta 60 mg daily and this is controlling her nerve pain.  She is doing fine without the oxycodone.  She would like me to prescribe this so she no longer needs to see the specialist.  She denies any side effects from the Cymbalta.  Hypertension: She is taking her medication daily. She is compliant with a low sodium diet.  She denies chest pain, palpitations, edema, shortness of breath and regular headaches.    Medications and allergies reviewed with patient and updated if appropriate.  Patient Active Problem List   Diagnosis Date Noted  . Sciatica of left side 07/25/2017  . Prediabetes 12/11/2016  . Essential hypertension, benign 07/16/2013  . Lymphedema of upper extremity following lymphadenectomy 11/07/2011  . Nonspecific abnormal electrocardiogram (ECG) (EKG) 09/27/2011  . Cervicalgia 04/12/2009  . BACK PAIN, LUMBAR 04/12/2009  . Hyperlipidemia 08/24/2008  . Osteoarthritis 08/24/2008  . Osteopenia 08/24/2008  . hx: breast cancer, left, invasive ductal carcinoma with axillary  mets, receptor + 08/24/2008    Current Outpatient Medications on File Prior to Visit  Medication Sig Dispense Refill  . anastrozole (ARIMIDEX) 1 MG tablet TAKE ONE TABLET BY MOUTH DAILY 90 tablet 2  . Calcium Carbonate-Vitamin D (CALCIUM 600 + D PO) Take 1 capsule by mouth 2 (two) times daily. 600/400    . DULoxetine (CYMBALTA) 60 MG capsule Take 60 mg by mouth daily.    Marland Kitchen ibuprofen (ADVIL,MOTRIN) 200 MG tablet Take 200 mg by mouth every 6 (six) hours as needed.      Marland Kitchen LYSINE PO Take 500 mg by mouth as needed.      . metoprolol tartrate (LOPRESSOR) 50 MG tablet Take 1 tablet (50 mg total) by mouth 2 (two) times daily. 180 tablet 1  . Multiple Vitamins-Minerals (SENIOR MULTIVITAMIN PLUS PO) Take by mouth daily.      Marland Kitchen spironolactone (ALDACTONE) 25 MG tablet TAKE ONE TABLET BY MOUTH DAILY 90 tablet 3  . Vitamin E 200 UNITS TABS Take 400 Units by mouth daily.      No current facility-administered medications on file prior to visit.     Past Medical History:  Diagnosis Date  . Arthritis    chronic neck and back pain  . Breast cancer ([place]) 1997   surgery, chemo, & radiation  . Breast cancer ([place]) 2012   surgery, chemo, & radiation with axillary dissection  . CTS (carpal tunnel syndrome) 2013   Dr [name] , Neurology  . Fasting hyperglycemia 2012   FBS 110  .  Hyperlipidemia   . Hypertension   . 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 MRSA (methicillin resistant staph aureus) culture positive 2012   swab was positive, treated per protocol.  No other issues.  . Osteopenia   . Personal history of chemotherapy 1997  . Personal history of radiation therapy 1997   x2  . PONV (postoperative nausea and vomiting) 1997   no problems since    Past Surgical History:  Procedure Laterality Date  . AXILLARY NODE DISSECTION  08/2010   left  . AXILLARY NODE DISSECTION Left 2012   with axillary dissection on left  . BREAST BIOPSY  2012   x2  .  BREAST LUMPECTOMY  09/1995, 10/1995   left - lumpectomy   . BREAST LUMPECTOMY  2012   Lumpectomy Axilla   . BREAST SURGERY  2012    axillary node dissection left  . CARPAL TUNNEL RELEASE  10/29/2011   Procedure: CARPAL TUNNEL RELEASE;  Surgeon: Cammie Sickle., MD;  Location: [facility];  Service: Orthopedics;  Laterality: Right;  . COLONOSCOPY  2005   negative  . DILATION AND CURETTAGE, DIAGNOSTIC / THERAPEUTIC  2002  . KNEE SURGERY  2000   left  . TRIGGER FINGER RELEASE  10/29/2011   Procedure: RELEASE TRIGGER FINGER/A-1 PULLEY;  Surgeon: Cammie Sickle., MD;  Location: [facility];  Service: Orthopedics;  Laterality: Right;  right long and right index     Social History   Socioeconomic History  . Marital status: Married    Spouse name: Not on file  . Number of children: 1  . Years of education: Not on file  . Highest education level: Not on file  Occupational History  . Not on file  Social Needs  . Financial resource strain: Not hard at all  . Food insecurity:    Worry: Never true    Inability: Never true  . Transportation needs:    Medical: No    Non-medical: No  Tobacco Use  . Smoking status: Never Smoker  . Smokeless tobacco: Never Used  Substance and Sexual Activity  . Alcohol use: No    Alcohol/week: 0.0 standard drinks  . Drug use: No  . Sexual activity: Not on file  Lifestyle  . Physical activity:    Days per week: 4 days    Minutes per session: 60 min  . Stress: Not at all  Relationships  . Social connections:    Talks on phone: More than three times a week    Gets together: More than three times a week    Attends religious service: More than 4 times per year    Active member of club or organization: Yes    Attends meetings of clubs or organizations: More than 4 times per year    Relationship status: Married  Other Topics Concern  . Not on file  Social History Narrative   Retired Licensed conveyancer      Exercise: walking      Family History  Problem Relation Age of Onset  . Hypertension Mother   . Arthritis Mother        OA  . Arthritis Maternal Grandfather        OA  . Heart attack Maternal Grandfather 87  . Heart attack Maternal Uncle 65  . Stroke Neg Hx   . Diabetes Neg Hx   . Breast cancer Neg Hx     Review  of Systems  Constitutional: Negative for fever.  Eyes: Negative for visual disturbance.  Respiratory: Negative for shortness of breath.   Cardiovascular: Negative for chest pain, palpitations and leg swelling.  Neurological: Negative for headaches.  Psychiatric/Behavioral: Negative for confusion, dysphoric mood and hallucinations. The patient is not nervous/anxious.        Objective:   Vitals:   02/18/18 1301  BP: 120/78  Pulse: 81  Resp: 16  Temp: 98.7 F (37.1 C)  SpO2: 97%   BP Readings from Last 3 Encounters:  02/18/18 120/78  01/13/18 114/77  12/16/17 124/72   Wt Readings from Last 3 Encounters:  02/18/18 109 lb 12.8 oz (49.8 kg)  01/13/18 111 lb 1.6 oz (50.4 kg)  12/16/17 110 lb (49.9 kg)   Body mass index is 21.44 kg/m.   Physical Exam    Constitutional: Appears well-developed and well-nourished. No distress.  HENT:  Head: Normocephalic and atraumatic.  Neck: Neck supple. No tracheal deviation present. No thyromegaly present.  No cervical lymphadenopathy Cardiovascular: Normal rate, regular rhythm and normal heart sounds.   No murmur heard. No carotid bruit .  No edema Pulmonary/Chest: Effort normal and breath sounds normal. No respiratory distress. No has no wheezes. No rales.  Skin: Skin is warm and dry. Not diaphoretic.  Psychiatric: Normal mood and affect. Behavior is normal. Thought content normal.  Judgment normal.      Assessment & Plan:    See Problem List for Assessment and Plan of chronic medical problems.

## 2018-02-17 NOTE — Telephone Encounter (Signed)
She called in c/o having hallucinations (not presently), I was thinking there were people in my house when there wasn't.   See below for details as she was telling me. She was on Oxycodone from the pain management doctor for nerve pain from Cancer pain.    She stopped taking the Oxycodone at the end of March.   I was having trouble sleeping as a result so my pain management doctor prescribed Trazodone to take at bedtime.   It made me dizzy, blurred my vision and I had difficulty swallowing.   Those symptoms finally subsided and I was ok.   She took Trazodone 6/13-8/12.     8/12 was her last dose of Trazodone. See below. She still does not feel right.    She is going to try and contact her pain management doctor since he prescribed the Trazodone but I also made her an appt with Dr. [name] too for 02/18/18 at 1:00.    If she gets in touch with her pain medicine doctor and the problem is addressed she will cancel the appt with Dr. [name].  I routed this to Dr. [name] so she would be aware of pt's situation.   Reason for Disposition . [1] Longstanding confusion (e.g., dementia, stroke) AND [2] NO worsening or change  Answer Assessment - Initial Assessment Questions 1. SYMPTOM: "What is the main symptom you are concerned about?" (e.g., weakness, numbness)     I've been under pain management care from [facility].    I was on Oxycodone to help with the nerve pain.   It helped.   I decided to get off of the Oxycodone.   I stopped taking it at the end of March.   I was having trouble sleeping.   She prescribed Trazodone 50 mg to take at bedtime.    I got dizzy, blurred vision, difficulty swallowing.   It lessened and became ok.    Time for renewal for the Trazodone.   June 13th I renewed it.  6/13 and 8/12 I was on Trazodone.  I have hallucinations, mistake people for other people, think there was other people in the house and I was alone.     Someone called the pain dr. For me and the pain dr said to stop the Trazodone.  I  still don't feel right.   August 12th since I took the last dose of Trazodone.   2. ONSET: "When did this start?" (minutes, hours, days; while sleeping)     I'm having trouble identifying people still since mid to late July.   People around me said I was not myself.    3. LAST NORMAL: "When was the last time you were normal (no symptoms)?"     *No Answer* 4. PATTERN "Does this come and go, or has it been constant since it started?"  "Is it present now?"     *No Answer* 5. CARDIAC SYMPTOMS: "Have you had any of the following symptoms: chest pain, difficulty breathing, palpitations?"     *No Answer* 6. NEUROLOGIC SYMPTOMS: "Have you had any of the following symptoms: headache, dizziness, vision loss, double vision, changes in speech, unsteady on your feet?"     *No Answer* 7. OTHER SYMPTOMS: "Do you have any other symptoms?"     *No Answer* 8. PREGNANCY: "Is there any chance you are pregnant?" "When was your last menstrual period?"     *No Answer*  Protocols used: CONFUSION - DELIRIUM-A-AH, NEUROLOGIC DEFICIT-A-AH

## 2018-02-18 ENCOUNTER — Encounter: Payer: Self-pay | Admitting: Internal Medicine

## 2018-02-18 ENCOUNTER — Ambulatory Visit: Payer: Medicare Other | Admitting: Internal Medicine

## 2018-02-18 VITALS — BP 120/78 | HR 81 | Temp 98.7°F | Resp 16 | Ht 60.0 in | Wt 109.8 lb

## 2018-02-18 DIAGNOSIS — G479 Sleep disorder, unspecified: Secondary | ICD-10-CM

## 2018-02-18 DIAGNOSIS — R443 Hallucinations, unspecified: Secondary | ICD-10-CM | POA: Diagnosis not present

## 2018-02-18 DIAGNOSIS — Z23 Encounter for immunization: Secondary | ICD-10-CM

## 2018-02-18 DIAGNOSIS — G620 Drug-induced polyneuropathy: Secondary | ICD-10-CM

## 2018-02-18 DIAGNOSIS — T451X5A Adverse effect of antineoplastic and immunosuppressive drugs, initial encounter: Secondary | ICD-10-CM

## 2018-02-18 DIAGNOSIS — G622 Polyneuropathy due to other toxic agents: Secondary | ICD-10-CM | POA: Insufficient documentation

## 2018-02-18 DIAGNOSIS — I10 Essential (primary) hypertension: Secondary | ICD-10-CM | POA: Diagnosis not present

## 2018-02-18 HISTORY — DX: Drug-induced polyneuropathy: T45.1X5A

## 2018-02-18 HISTORY — DX: Drug-induced polyneuropathy: G62.0

## 2018-02-18 NOTE — Assessment & Plan Note (Signed)
BP well controlled Current regimen effective and well tolerated Continue current medications at current doses  

## 2018-02-18 NOTE — Patient Instructions (Addendum)
Try lavender and other essential oils for your sleep.    Try melatonin or other natural supplements for your sleep, such as sleepy time tea.  You can start with 1 mg of melatonin at night and increase slightly if needed.    I would ideally avoid any prescription medication for your sleep.    Follow up in 6 months, sooner if needed

## 2018-02-18 NOTE — Assessment & Plan Note (Addendum)
Has been going pain management and was on oxycodone for a while, but that was discontinued several months ago Currently taking Cymbalta 60 mg daily, which is effective She is asked that I will prescribe the Cymbalta, which I am okay with Continue Cymbalta 60 mg daily She will follow-up in 6 months She would know if she needs a prescription

## 2018-02-18 NOTE — Assessment & Plan Note (Signed)
She has been experiencing sleep difficulties for a while and was started on trazodone by pain management.  She took this for a few months and for approximately 1 month she was experiencing side effects including hallucinations, blurry vision, not knowing her husband was in memory difficulties She stopped the medication approximately 2 weeks ago and the symptoms have resolved Trazodone added to allergy list Currently not taking anything She has not tried anything natural in the past and discussed that if she does need something she should try something natural first I do not recommend any prescription medications since she had the above side effects

## 2018-02-20 ENCOUNTER — Telehealth: Payer: Self-pay

## 2018-02-20 DIAGNOSIS — R4182 Altered mental status, unspecified: Secondary | ICD-10-CM

## 2018-02-20 NOTE — Telephone Encounter (Signed)
Spoke with husband about notes below and he stated that wife is doing better today. I advised that in order for [place] to know if she has had a stroke she would need some testing done like a CT scan. He states he did not want to do that at the moment he wanted to wait and see how she did today. I advised if sxs got worse or did not improve from yesterday to go to ED.

## 2018-02-20 NOTE — Telephone Encounter (Signed)
Copied from [place] [phone]. Topic: General - Other >> Feb 19, 2018  4:19 PM Mcneil, Ja-Kwan wrote: Reason for CRM: Pt states she was seen on 02/18/18 and she forgot to ask Dr [name] if there is a possibility that she could have had a slight stroke. Pt requests call back. Cb# [phone]

## 2018-02-20 NOTE — Telephone Encounter (Signed)
When she saw me she was not having any more symptoms.    If she is still having any concerning symptoms it is not likely from the trazodone and something else could be going on.   So if she is still having symptoms we need to do further testing - imaging of her brain, possible neurology referral.

## 2018-02-20 NOTE — Telephone Encounter (Signed)
Copied from [place] [phone]. Topic: Quick Communication - See Telephone Encounter >> Feb 19, 2018  2:26 PM Reyne Dumas L wrote: CRM for notification. See Telephone encounter for: 02/19/18.  Pt's spouse calling in.  States that his wife can't remember who he is.  Pt's spouse states pt was on a psychedelic medication and he wants her to have something that will counteract that. [name], can be reached reached at [phone]

## 2018-02-23 ENCOUNTER — Ambulatory Visit (HOSPITAL_COMMUNITY)
Admission: RE | Admit: 2018-02-23 | Discharge: 2018-02-23 | Disposition: A | Discharge status: Home / Self Care | Payer: Medicare Other | Source: Ambulatory Visit | Attending: Internal Medicine | Admitting: Internal Medicine

## 2018-02-23 DIAGNOSIS — Z853 Personal history of malignant neoplasm of breast: Secondary | ICD-10-CM | POA: Diagnosis not present

## 2018-02-23 DIAGNOSIS — R4182 Altered mental status, unspecified: Secondary | ICD-10-CM | POA: Diagnosis not present

## 2018-02-23 NOTE — Telephone Encounter (Signed)
Pts husband aware of order.

## 2018-02-23 NOTE — Telephone Encounter (Signed)
Spoke with husband this morning and he has changed his mind on CT scan. He states that he would like to have a CT scan as soon as possible. Wife is in and out on remembering who he is and he would like to rule out a stroke. Please advise.

## 2018-02-23 NOTE — Telephone Encounter (Signed)
Husband states they could not do a CT Scan on Friday, and wanted to reschedule for today. I do not see where pt had an appt for a ct scan or an order. He would like to take her to [facility] today for the CT scan Please advise

## 2018-02-23 NOTE — Telephone Encounter (Signed)
CT of head ordered - they will call her.  Depending on results we will determine what to do next.

## 2018-03-02 ENCOUNTER — Telehealth: Payer: Self-pay | Admitting: Internal Medicine

## 2018-03-02 DIAGNOSIS — R413 Other amnesia: Secondary | ICD-10-CM

## 2018-03-02 DIAGNOSIS — R410 Disorientation, unspecified: Secondary | ICD-10-CM

## 2018-03-02 NOTE — Telephone Encounter (Signed)
Copied from [place] [phone]. Topic: Referral - Request >> Mar 02, 2018  9:39 AM Reyne Dumas L wrote: Reason for CRM:   Pt's spouse, [name], calling in.  Pt would like a referral to a Psychologist, occupational. [name] can be reached at [phone]   Has to do with LOV 02/18/18.

## 2018-03-03 NOTE — Telephone Encounter (Signed)
Referral ordered

## 2018-03-03 NOTE — Telephone Encounter (Signed)
Spoke with husband and he advised at this point any neurologist is fine. He just wants a referral. He is concerned that his wife is having instances here and there where she does not remember who he is.

## 2018-03-04 ENCOUNTER — Telehealth: Payer: Self-pay | Admitting: Neurology

## 2018-03-04 ENCOUNTER — Ambulatory Visit: Payer: Medicare Other | Admitting: Neurology

## 2018-03-04 ENCOUNTER — Encounter: Payer: Self-pay | Admitting: Neurology

## 2018-03-04 VITALS — BP 113/72 | HR 81 | Ht 60.0 in | Wt 108.0 lb

## 2018-03-04 DIAGNOSIS — R410 Disorientation, unspecified: Secondary | ICD-10-CM | POA: Insufficient documentation

## 2018-03-04 DIAGNOSIS — R413 Other amnesia: Secondary | ICD-10-CM

## 2018-03-04 NOTE — Telephone Encounter (Signed)
[employer] Medicare order sent to [facility]. No auth they will reach out to the pt to schedule.  °

## 2018-03-04 NOTE — Patient Instructions (Signed)
Drink a lots of water

## 2018-03-04 NOTE — Progress Notes (Addendum)
PATIENT: LAMOINE FREDRICKSEN DOB: 10/17/1950  Chief Complaint  Patient presents with  . Memory Loss    MMSE 30/30 - 14 animals. She is here with her husband, [name], to have her memory loss, confusion spells and visual hallucinations further evaluated.  She felt these symptoms started shortly after being prescribed trazodone 50mg  at bedtime for sleep.  However, she stopped the medication in August but her symptoms have not improved.  She just recently completed a CT head.   Marland Kitchen PCP    [name], MD     HISTORICAL  MARIKAY ROADS is a 67 year old female, seen in request by [name], [name] for evaluation of memory loss, she is accompanied by her [name] at today's clinical visit, initial evaluation was on March 04, 2018.  I have reviewed and summarized the referring note from the referring physician, she had a history of left breast cancer, was able to review her most recent oncology evaluation with Dr. [name] in January 2019, stage I left-sided breast cancer in April 1997, treated with lobectomy radiation, CMF chemotherapy, 5 years of tamoxifen, metastatic carcinoma involving left axillary lymph node, had left axillary lymph node dissection on September 13, 2010, status post 4 cycle adjuvant TC chemotherapy, completed on January 07, 2011, begin Arimidex in December 2012, left arm lymphedema,  She is a retired Art therapist, enjoys reading, had a sudden onset memory change since May 2019, she has chronic insomnia, was giving a prescription of trazodone in May 2019 to June 2019, which did not help her sleep much, but during that period of time, she began to have spells of hallucinations, seeing people at her house, woke up in the middle of the night confused, staring at her husband could not recognize him, spell lasted for few minutes, intermittent,  Since trazodone was stopped, she remains symptomatic, sometimes while eating breakfast, she is confused where she is,  could not recognize her husband  REVIEW OF SYSTEMS:  Full 14 system review of systems performed and notable only for blurred vision, memory loss, confusion, anxiety, decreased energy, hallucinations  all other review of systems were negative.  ALLERGIES: Allergies  Allergen Reactions  . Compazine Other (See Comments)    Muscle spasms in neck & back with jaw tightness  . Trazodone And Nefazodone     Confusion and hallucinations  . Erythromycin Nausea And Vomiting  . Prochlorperazine Edisylate     nausea    HOME MEDICATIONS: Current Outpatient Medications  Medication Sig Dispense Refill  . anastrozole (ARIMIDEX) 1 MG tablet TAKE ONE TABLET BY MOUTH DAILY 90 tablet 2  . Calcium Carbonate-Vitamin D (CALCIUM 600 + D PO) Take 1 capsule by mouth 2 (two) times daily. 600/400    . DULoxetine (CYMBALTA) 60 MG capsule Take 60 mg by mouth daily.    Marland Kitchen ibuprofen (ADVIL,MOTRIN) 200 MG tablet Take 200 mg by mouth every 6 (six) hours as needed.      Marland Kitchen LYSINE PO Take 500 mg by mouth as needed.      . metoprolol tartrate (LOPRESSOR) 50 MG tablet Take 1 tablet (50 mg total) by mouth 2 (two) times daily. 180 tablet 1  . Multiple Vitamins-Minerals (SENIOR MULTIVITAMIN PLUS PO) Take by mouth daily.      Marland Kitchen spironolactone (ALDACTONE) 25 MG tablet TAKE ONE TABLET BY MOUTH DAILY 90 tablet 3  . Vitamin E 200 UNITS TABS Take 400 Units by mouth daily.      No  current facility-administered medications for this visit.     PAST MEDICAL HISTORY: Past Medical History:  Diagnosis Date  . Arthritis    chronic neck and back pain  . Breast cancer ([facility]) 1997   surgery, chemo, & radiation  . Breast cancer ([facility]) 2012   surgery, chemo, & radiation with axillary dissection  . CTS (carpal tunnel syndrome) 2013   Dr [name] , Neurology  . Fasting hyperglycemia 2012   FBS 110  . Hyperlipidemia   . Hypertension   . 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 Memory loss     . MRSA (methicillin resistant staph aureus) culture positive 2012   swab was positive, treated per protocol.  No other issues.  . Osteopenia   . Personal history of chemotherapy 1997  . Personal history of radiation therapy 1997   x2  . PONV (postoperative nausea and vomiting) 1997   no problems since    PAST SURGICAL HISTORY: Past Surgical History:  Procedure Laterality Date  . AXILLARY NODE DISSECTION  08/2010   left  . AXILLARY NODE DISSECTION Left 2012   with axillary dissection on left  . BREAST BIOPSY  2012   x2  . BREAST LUMPECTOMY  09/1995, 10/1995   left - lumpectomy   . BREAST LUMPECTOMY  2012   Lumpectomy Axilla   . BREAST SURGERY  2012    axillary node dissection left  . CARPAL TUNNEL RELEASE  10/29/2011   Procedure: CARPAL TUNNEL RELEASE;  Surgeon: Cammie Sickle., MD;  Location: [facility];  Service: Orthopedics;  Laterality: Right;  . COLONOSCOPY  2005   negative  . DILATION AND CURETTAGE, DIAGNOSTIC / THERAPEUTIC  2002  . KNEE SURGERY  2000   left  . TRIGGER FINGER RELEASE  10/29/2011   Procedure: RELEASE TRIGGER FINGER/A-1 PULLEY;  Surgeon: Cammie Sickle., MD;  Location: [facility];  Service: Orthopedics;  Laterality: Right;  right long and right index     FAMILY HISTORY: Family History  Problem Relation Age of Onset  . Hypertension Mother   . Arthritis Mother        OA  . Other Father        unknown medical history  . Arthritis Maternal Grandfather        OA  . Heart attack Maternal Grandfather 87  . Heart attack Maternal Uncle 65  . Stroke Neg Hx   . Diabetes Neg Hx   . Breast cancer Neg Hx     SOCIAL HISTORY: Social History   Socioeconomic History  . Marital status: Married    Spouse name: Not on file  . Number of children: 1  . Years of education: college  . Highest education level: Master's degree (e.g., MA, MS, MEng, MEd, MSW, MBA)  Occupational History  . Occupation: Retired  Scientific laboratory technician  .  Financial resource strain: Not hard at all  . Food insecurity:    Worry: Never true    Inability: Never true  . Transportation needs:    Medical: No    Non-medical: No  Tobacco Use  . Smoking status: Never Smoker  . Smokeless tobacco: Never Used  Substance and Sexual Activity  . Alcohol use: No    Alcohol/week: 0.0 standard drinks  . Drug use: No  . Sexual activity: Not on file  Lifestyle  . Physical activity:    Days per week: 4 days    Minutes  per session: 60 min  . Stress: Not at all  Relationships  . Social connections:    Talks on phone: More than three times a week    Gets together: More than three times a week    Attends religious service: More than 4 times per year    Active member of club or organization: Yes    Attends meetings of clubs or organizations: More than 4 times per year    Relationship status: Married  . Intimate partner violence:    Fear of current or ex partner: No    Emotionally abused: No    Physically abused: No    Forced sexual activity: No  Other Topics Concern  . Not on file  Social History Narrative   Retired Licensed conveyancer      Exercise: walking      Lives at home with her husband.   Right-handed.   2-3 cups coffee per day.     PHYSICAL EXAM   Vitals:   03/04/18 1146  BP: 113/72  Pulse: 81  Weight: 108 lb (49 kg)  Height: 5' (1.524 m)    Not recorded      Body mass index is 21.09 kg/m.  PHYSICAL EXAMNIATION:  Gen: NAD, conversant, well nourised, obese, well groomed                     Cardiovascular: Regular rate rhythm, no peripheral edema, warm, nontender. Eyes: Conjunctivae clear without exudates or hemorrhage Neck: Supple, no carotid bruits. Pulmonary: Clear to auscultation bilaterally   NEUROLOGICAL EXAM:  MMSE - Mini Mental State Exam 03/04/2018 12/12/2017  Orientation to time 5 5  Orientation to Place 5 5  Registration 3 3  Attention/ Calculation 5 5  Recall 3 2  Language- name 2 objects 2 2  Language-  repeat 1 1  Language- follow 3 step command 3 3  Language- read & follow direction 1 1  Write a sentence 1 1  Copy design 1 1  Total score 30 29  animal naming 14.   CRANIAL NERVES: CN II: Visual fields are full to confrontation. Fundoscopic exam is normal with sharp discs and no vascular changes. Pupils are round equal and briskly reactive to light. CN III, IV, VI: extraocular movement are normal. No ptosis. CN V: Facial sensation is intact to pinprick in all 3 divisions bilaterally. Corneal responses are intact.  CN VII: Face is symmetric with normal eye closure and smile. CN VIII: Hearing is normal to rubbing fingers CN IX, X: Palate elevates symmetrically. Phonation is normal. CN XI: Head turning and shoulder shrug are intact CN XII: Tongue is midline with normal movements and no atrophy.  MOTOR: There is no pronator drift of out-stretched arms. Muscle bulk and tone are normal. Muscle strength is normal.  REFLEXES: Reflexes are 2+ and symmetric at the biceps, triceps, knees, and ankles. Plantar responses are flexor.  SENSORY: Intact to light touch, pinprick, positional sensation and vibratory sensation are intact in fingers and toes.  COORDINATION: Rapid alternating movements and fine finger movements are intact. There is no dysmetria on finger-to-nose and heel-knee-shin.    GAIT/STANCE: Posture is normal. Gait is steady with normal steps, base, arm swing, and turning. Heel and toe walking are normal. Tandem gait is normal.  Romberg is absent.   DIAGNOSTIC DATA (LABS, IMAGING, TESTING) - I reviewed patient records, labs, notes, testing and imaging myself where available.   ASSESSMENT AND PLAN  CORBYN STEEDMAN is a 67 y.o.  female   Subacute onset of transient confusion History of metastatic breast carcinoma involving axillary lymph node  MRI of the brain with without contrast to rule out structural lesion    Vitamin B12 level    Marcial Pacas, M.D. Ph.D.  [facility] [address], [place], [place] [address] Ph: [phone] Fax: [phone]  CC:  [name], MD

## 2018-03-05 ENCOUNTER — Telehealth: Payer: Self-pay | Admitting: *Deleted

## 2018-03-05 LAB — VITAMIN B12: Vitamin B-12: 1698 pg/mL — ABNORMAL HIGH (ref 232–1245)

## 2018-03-05 NOTE — Telephone Encounter (Signed)
-----   Message from Marcial Pacas, MD sent at 03/05/2018  8:27 AM EDT ----- Please call patient for high normal B12, likely due to Vit B12 supplement.

## 2018-03-05 NOTE — Telephone Encounter (Signed)
Spoke to patient - states she is only getting a B12 supplement of 51mcg daily within her multivitamin.  Per vo by Dr. [name], it is okay for her to continue her multivitamin.  The patient has a pending appt on 04/02/18.

## 2018-03-16 NOTE — Telephone Encounter (Signed)
Left message, on her daughter's voicemail, requesting a call back Shirley Melendez).

## 2018-03-16 NOTE — Telephone Encounter (Signed)
Pt daughter on dpr-LAURIE [facility] is asking for a call to discuss why pts B12 levels are so high

## 2018-03-16 NOTE — Telephone Encounter (Signed)
Spoke to patient's daughter who is aware of Dr. Rhea Belton response below.  She is agreeable to this plan.

## 2018-03-16 NOTE — Telephone Encounter (Signed)
Please call patient, literature reviewed, significant elevated B12 level could indicate increased production versus decreased metabolism of vitamin B12,  It can be associated with hematological disorder, severe liver disease, is increased cobalamin release, and decreased clearance by affected liver cells,  Her laboratory evaluation in July 2019 showed normal liver functional tests, normal CBC,  We can potentially repeat B12 level, and refer her to hematologist for further evaluation if this remains elevated    Significance of elevated cobalamin (vitamin B12) levels in blood. Ermens AA1, Vlasveld LT, Lindemans J.  Chief Strategy Officer information 1 Clinical Laboratory, [facility], [address] [place], [place], [place]. [email or web address] Abstract Elevated levels of serum cobalamin may be a sign of a serious, even life-threatening, disease. Hematologic disorders like chronic myelogeneous leukemia, promyelocytic leukemia, polycythemia vera and also the hypereosinophilic syndrome can result in elevated levels of cobalamin. Not surprisingly, a rise of the cobalamin concentration in serum is one of the diagnostic criteria for the latter two diseases. The increase in circulating cobalamin levels is predominantly caused by enhanced production of haptocorrin. Several liver diseases like acute hepatitis, cirrhosis, hepatocellular carcinoma and metastatic liver disease can also be accompanied by an increase in circulating cobalamin. This phenomenon is predominantly caused by cobalamin release during hepatic cytolysis and/or decreased cobalamin clearance by the affected liver. Altogether it can be concluded that an observed elevation of cobalamin in blood merits the a full diagnostic work up to assess the presence of disease.

## 2018-03-16 NOTE — Telephone Encounter (Signed)
Attempted to reach patient's daughter again.  Phone rang repeatedly but no answer or machine this time.

## 2018-03-21 ENCOUNTER — Ambulatory Visit
Admission: RE | Admit: 2018-03-21 | Discharge: 2018-03-21 | Disposition: A | Discharge status: Home / Self Care | Payer: Medicare Other | Source: Ambulatory Visit | Attending: Neurology | Admitting: Neurology

## 2018-03-21 DIAGNOSIS — R413 Other amnesia: Secondary | ICD-10-CM

## 2018-03-21 MED ORDER — GADOBENATE DIMEGLUMINE 529 MG/ML IV SOLN
9.0000 mL | Freq: Once | INTRAVENOUS | Status: AC | PRN
  Administered 2018-03-21: 9 mL via INTRAVENOUS

## 2018-03-23 ENCOUNTER — Telehealth: Payer: Self-pay | Admitting: Neurology

## 2018-03-23 NOTE — Telephone Encounter (Signed)
Spoke to patient - she is aware of her MRI results and will keep her pending appt on 04/02/18 for further review.

## 2018-03-23 NOTE — Telephone Encounter (Signed)
Please call patient MRI of the brain showed no acute abnormality and there is evidence of generalized atrophy, small vessel disease.  IMPRESSION: This MRI of the brain with and without contrast shows the following: 1.     Mild generalized cortical atrophy that has mildly progressed when compared to the 04/09/2009 CT scan. 2.     Minimal chronic microvascular ischemic changes 3.     There is a normal enhancement pattern and there are no acute findings.

## 2018-03-30 ENCOUNTER — Telehealth: Payer: Self-pay | Admitting: Neurology

## 2018-03-30 NOTE — Telephone Encounter (Signed)
I called and spoke with patient and made her aware. She was very tearful and stated that she's been having more confusion and hallucinations over the last few days. She is coming in for an appt on Thursday. She just wanted to make you aware of this.

## 2018-03-30 NOTE — Telephone Encounter (Signed)
Patient calling to ask if she will have lab work done at next appointment on 04-02-18 with Dr. [name]. If so she will not take a vitamin. Please call and advise.

## 2018-03-30 NOTE — Telephone Encounter (Signed)
Yes, we can have repeat Vit B12 level done at next office visit

## 2018-04-02 ENCOUNTER — Ambulatory Visit: Payer: Medicare Other | Admitting: Neurology

## 2018-04-02 ENCOUNTER — Encounter: Payer: Self-pay | Admitting: Neurology

## 2018-04-02 VITALS — BP 126/79 | HR 85 | Ht 60.0 in | Wt 108.0 lb

## 2018-04-02 DIAGNOSIS — R7989 Other specified abnormal findings of blood chemistry: Secondary | ICD-10-CM

## 2018-04-02 DIAGNOSIS — G3184 Mild cognitive impairment, so stated: Secondary | ICD-10-CM | POA: Diagnosis not present

## 2018-04-02 HISTORY — DX: Other specified abnormal findings of blood chemistry: R79.89

## 2018-04-02 MED ORDER — DONEPEZIL HCL 10 MG PO TABS: 10.0000 mg | ORAL_TABLET | Freq: Every day | ORAL | 11 refills | Status: DC

## 2018-04-02 NOTE — Progress Notes (Signed)
PATIENT: Shirley Melendez DOB: 02/04/1951  Chief Complaint  Patient presents with  . Follow-up    PCP: Dr. Billey Gosling.   . Memory Loss    Pt reports that she is still having a lot trouble with confusion and hallucinations.      HISTORICAL  Shirley Melendez is a 67 year old female, seen in request by [name], [name] for evaluation of memory loss, she is accompanied by her [name] at today's clinical visit, initial evaluation was on March 04, 2018.  I have reviewed and summarized the referring note from the referring physician, she had a history of left breast cancer, was able to review her most recent oncology evaluation with Dr. [name] in January 2019, stage I left-sided breast cancer in April 1997, treated with lobectomy radiation, CMF chemotherapy, 5 years of tamoxifen, metastatic carcinoma involving left axillary lymph node, had left axillary lymph node dissection on September 13, 2010, status post 4 cycle adjuvant TC chemotherapy, completed on January 07, 2011, begin Arimidex in December 2012, left arm lymphedema,  She is a retired Art therapist, enjoys reading, had a sudden onset memory change since May 2019, she has chronic insomnia, was giving a prescription of trazodone in May 2019 to June 2019, which did not help her sleep much, but during that period of time, she began to have spells of hallucinations, seeing people at her house, woke up in the middle of the night confused, staring at her husband could not recognize him, spell lasted for few minutes, intermittent,  Since trazodone was stopped, she remains symptomatic, sometimes while eating breakfast, she is confused where she is, could not recognize her husband  UPDATE Apr 02 2018: She is accompanied by her husband and daughter [name] at today's visit, we personally reviewed MRI of the brain with without contrast on March 21, 2018, there is evidence of generalized atrophy, progressed compared to  previous CT scan in 2010, mild supratentorium small vessel disease.  She has stopped trazodone since August 2019, she continue have episode of confusion, she gave me an example, one afternoon she came back from outside to her house, saw a man she seems to know before trying explain to her that she married to him for 41 years, there are three different mans, husband explained that it was him trying to explains to her what is going on, she was confused.  She denies significant memory loss, but has intermittent confusion spells,  Laboratory evaluations showed elevated B12 1698, mild elevated creatinine 1.02, elevated total cholesterol 227, LDL 128, normal CBC, TSH, A1c was 5.8,  REVIEW OF SYSTEMS:  Full 14 system review of systems performed and notable only for blurred vision, back pain, neck pain, back pain, hallucinations,  ALLERGIES: Allergies  Allergen Reactions  . Compazine Other (See Comments)    Muscle spasms in neck & back with jaw tightness  . Trazodone And Nefazodone     Confusion and hallucinations  . Erythromycin Nausea And Vomiting  . Prochlorperazine Edisylate     nausea    HOME MEDICATIONS: Current Outpatient Medications  Medication Sig Dispense Refill  . anastrozole (ARIMIDEX) 1 MG tablet TAKE ONE TABLET BY MOUTH DAILY 90 tablet 2  . Calcium Carbonate-Vitamin D (CALCIUM 600 + D PO) Take 1 capsule by mouth 2 (two) times daily. 600/400    . DULoxetine (CYMBALTA) 60 MG capsule Take 60 mg by mouth daily.    Marland Kitchen ibuprofen (ADVIL,MOTRIN) 200 MG tablet Take 200 mg  by mouth every 6 (six) hours as needed.      Marland Kitchen LYSINE PO Take 500 mg by mouth as needed.      . metoprolol tartrate (LOPRESSOR) 50 MG tablet Take 1 tablet (50 mg total) by mouth 2 (two) times daily. 180 tablet 1  . Multiple Vitamins-Minerals (SENIOR MULTIVITAMIN PLUS PO) Take by mouth daily.      Marland Kitchen spironolactone (ALDACTONE) 25 MG tablet TAKE ONE TABLET BY MOUTH DAILY 90 tablet 3  . Vitamin E 200 UNITS TABS Take 400  Units by mouth daily.      No current facility-administered medications for this visit.     PAST MEDICAL HISTORY: Past Medical History:  Diagnosis Date  . Arthritis    chronic neck and back pain  . Breast cancer ([place]) 1997   surgery, chemo, & radiation  . Breast cancer ([place]) 2012   surgery, chemo, & radiation with axillary dissection  . CTS (carpal tunnel syndrome) 2013   Dr [name] , Neurology  . Fasting hyperglycemia 2012   FBS 110  . Hyperlipidemia   . Hypertension   . 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 Memory loss   . MRSA (methicillin resistant staph aureus) culture positive 2012   swab was positive, treated per protocol.  No other issues.  . Osteopenia   . Personal history of chemotherapy 1997  . Personal history of radiation therapy 1997   x2  . PONV (postoperative nausea and vomiting) 1997   no problems since    PAST SURGICAL HISTORY: Past Surgical History:  Procedure Laterality Date  . AXILLARY NODE DISSECTION  08/2010   left  . AXILLARY NODE DISSECTION Left 2012   with axillary dissection on left  . BREAST BIOPSY  2012   x2  . BREAST LUMPECTOMY  09/1995, 10/1995   left - lumpectomy   . BREAST LUMPECTOMY  2012   Lumpectomy Axilla   . BREAST SURGERY  2012    axillary node dissection left  . CARPAL TUNNEL RELEASE  10/29/2011   Procedure: CARPAL TUNNEL RELEASE;  Surgeon: Cammie Sickle., MD;  Location: [facility];  Service: Orthopedics;  Laterality: Right;  . COLONOSCOPY  2005   negative  . DILATION AND CURETTAGE, DIAGNOSTIC / THERAPEUTIC  2002  . KNEE SURGERY  2000   left  . TRIGGER FINGER RELEASE  10/29/2011   Procedure: RELEASE TRIGGER FINGER/A-1 PULLEY;  Surgeon: Cammie Sickle., MD;  Location: [facility];  Service: Orthopedics;  Laterality: Right;  right long and right index     FAMILY HISTORY: Family History  Problem Relation Age of Onset  . Hypertension Mother    . Arthritis Mother        OA  . Other Father        unknown medical history  . Arthritis Maternal Grandfather        OA  . Heart attack Maternal Grandfather 87  . Heart attack Maternal Uncle 65  . Stroke Neg Hx   . Diabetes Neg Hx   . Breast cancer Neg Hx     SOCIAL HISTORY: Social History   Socioeconomic History  . Marital status: Married    Spouse name: Not on file  . Number of children: 1  . Years of education: college  . Highest education level: Master's degree (e.g., MA, MS, MEng, MEd, MSW, MBA)  Occupational History  . Occupation: Retired  Scientific laboratory technician  .  Financial resource strain: Not hard at all  . Food insecurity:    Worry: Never true    Inability: Never true  . Transportation needs:    Medical: No    Non-medical: No  Tobacco Use  . Smoking status: Never Smoker  . Smokeless tobacco: Never Used  Substance and Sexual Activity  . Alcohol use: No    Alcohol/week: 0.0 standard drinks  . Drug use: No  . Sexual activity: Not on file  Lifestyle  . Physical activity:    Days per week: 4 days    Minutes per session: 60 min  . Stress: Not at all  Relationships  . Social connections:    Talks on phone: More than three times a week    Gets together: More than three times a week    Attends religious service: More than 4 times per year    Active member of club or organization: Yes    Attends meetings of clubs or organizations: More than 4 times per year    Relationship status: Married  . Intimate partner violence:    Fear of current or ex partner: No    Emotionally abused: No    Physically abused: No    Forced sexual activity: No  Other Topics Concern  . Not on file  Social History Narrative   Retired Licensed conveyancer      Exercise: walking      Lives at home with her husband.   Right-handed.   2-3 cups coffee per day.     PHYSICAL EXAM   Vitals:   04/02/18 1252  BP: 126/79  Pulse: 85  Weight: 108 lb (49 kg)  Height: 5' (1.524 m)    Not recorded        Body mass index is 21.09 kg/m.  PHYSICAL EXAMNIATION:  Gen: NAD, conversant, well nourised, obese, well groomed                     Cardiovascular: Regular rate rhythm, no peripheral edema, warm, nontender. Eyes: Conjunctivae clear without exudates or hemorrhage Neck: Supple, no carotid bruits. Pulmonary: Clear to auscultation bilaterally   NEUROLOGICAL EXAM:  MMSE - Mini Mental State Exam 03/04/2018 12/12/2017  Orientation to time 5 5  Orientation to Place 5 5  Registration 3 3  Attention/ Calculation 5 5  Recall 3 2  Language- name 2 objects 2 2  Language- repeat 1 1  Language- follow 3 step command 3 3  Language- read & follow direction 1 1  Write a sentence 1 1  Copy design 1 1  Total score 30 29   Montreal Cognitive Assessment  04/02/2018  Visuospatial/ Executive (0/5) 2  Naming (0/3) 3  Attention: Read list of digits (0/2) 2  Attention: Read list of letters (0/1) 0  Attention: Serial 7 subtraction starting at 100 (0/3) 2  Language: Repeat phrase (0/2) 1  Language : Fluency (0/1) 1  Abstraction (0/2) 0  Delayed Recall (0/5) 5  Orientation (0/6) 6  Total 22     CRANIAL NERVES: CN II: Visual fields are full to confrontation. Fundoscopic exam is normal with sharp discs and no vascular changes. Pupils are round equal and briskly reactive to light. CN III, IV, VI: extraocular movement are normal. No ptosis. CN V: Facial sensation is intact to pinprick in all 3 divisions bilaterally. Corneal responses are intact.  CN VII: Face is symmetric with normal eye closure and smile. CN VIII: Hearing is normal to rubbing fingers CN  IX, X: Palate elevates symmetrically. Phonation is normal. CN XI: Head turning and shoulder shrug are intact CN XII: Tongue is midline with normal movements and no atrophy.  MOTOR: There is no pronator drift of out-stretched arms. Muscle bulk and tone are normal. Muscle strength is normal.  REFLEXES: Reflexes are 2+ and symmetric at the  biceps, triceps, knees, and ankles. Plantar responses are flexor.  SENSORY: Intact to light touch, pinprick, positional sensation and vibratory sensation are intact in fingers and toes.  COORDINATION: Rapid alternating movements and fine finger movements are intact. There is no dysmetria on finger-to-nose and heel-knee-shin.    GAIT/STANCE: Posture is normal. Gait is steady with normal steps, base, arm swing, and turning. Heel and toe walking are normal. Tandem gait is normal.  Romberg is absent.   DIAGNOSTIC DATA (LABS, IMAGING, TESTING) - I reviewed patient records, labs, notes, testing and imaging myself where available.   ASSESSMENT AND PLAN  BERNIECE ABID is a 67 y.o. female   Intermittent confusion, History of metastatic breast carcinoma involving axillary lymph node  MRI of the brain with without contrast showed evidence of generalized atrophy, minimum supratentorium small vessel disease,  Moca SCORE was 22/30, there was no significant short-term memory loss, but there is evidence of visual-spatial/executive, abstraction impairment  Most consistent with central nervous system degenerative disorder, not typical for Alzheimer's disease, differentiation diagnosis including frontotemporal dementia,  Will refer her to neuropsychiatric evaluation,  Elevated vitamin B12 level  Repeat laboratory evaluations    Marcial Pacas, M.D. Ph.D.  [facility] [address] [place], [place] [address] Ph: [phone] Fax: [phone]  CC:  [name], MD

## 2018-04-06 ENCOUNTER — Telehealth: Payer: Self-pay | Admitting: Neurology

## 2018-04-06 LAB — METHYLMALONIC ACID, SERUM: Methylmalonic Acid: 253 nmol/L (ref 0–378)

## 2018-04-06 LAB — COPPER, SERUM: Copper: 92 ug/dL (ref 72–166)

## 2018-04-06 LAB — HOMOCYSTEINE: HOMOCYSTEINE: 13 umol/L (ref 0.0–15.0)

## 2018-04-06 LAB — FOLATE: Folate: >20 ng/mL (ref >3.0)

## 2018-04-06 LAB — VITAMIN B12: VITAMIN B 12: 1611 pg/mL — AB (ref 232–1245)

## 2018-04-06 NOTE — Telephone Encounter (Signed)
Please call patient, laboratory evaluations showed elevated Vit B12, with normal methylmalonic acid level, homocystine, folic acid, copper,  I have forwarded laboratory evaluation to her hematology oncologist Dr. [name], she may contact him to see further evaluation is needed,

## 2018-04-06 NOTE — Telephone Encounter (Signed)
Left patient a detailed message, with results, on voicemail (ok per DPR).  Provided our number to call back with any questions.  

## 2018-04-08 NOTE — Telephone Encounter (Addendum)
Per patient, she had her evaluation by Dr. Learta Codding.  States there was no concern about the elevated B12.  Says she was told her body would naturally correct the problem.  Additionally, she experienced vomiting and diarrhea when she took donepezil 10mg  at bedtime.  She is agreeable to try donepezil 5mg  at bedtime for 1-2 weeks, then attempt to increase it back up to 10mg  at bedtime.  She will call us back if the low dose is intolerable as well.

## 2018-04-08 NOTE — Telephone Encounter (Signed)
Pt requesting a call to discuss what Dr. Learta Codding told her regarding her B-12 please advise.

## 2018-04-10 ENCOUNTER — Telehealth: Payer: Self-pay | Admitting: *Deleted

## 2018-04-10 NOTE — Telephone Encounter (Signed)
Patients daughter called the office with concerns of the elevated B12. Per Dr. Benay Spice this was not concerning at this time. Daughter states the neurologist is very concerned and she is due to the life threatening conditions this can indicated. Message sent to Dr. Benay Spice to review notes and labs. Will follow up upon his return to the office.

## 2018-04-15 ENCOUNTER — Telehealth: Payer: Self-pay | Admitting: Internal Medicine

## 2018-04-15 ENCOUNTER — Encounter: Payer: Self-pay | Admitting: Oncology

## 2018-04-15 MED ORDER — DULOXETINE HCL 60 MG PO CPEP: 60.0000 mg | ORAL_CAPSULE | Freq: Every day | ORAL | 1 refills | Status: DC

## 2018-04-15 NOTE — Telephone Encounter (Addendum)
Yes I will prescribe - sent

## 2018-04-15 NOTE — Telephone Encounter (Signed)
Pts daughter(Laurie on DPR) called stating that she has been unable to contact Dr. Carin Hock office. Unsure if Dr. [name] would like to discuss lab results with him personally or if she should wait until the pt can schedule an appt. Please call to advise

## 2018-04-15 NOTE — Telephone Encounter (Signed)
Copied from [place] [id number]. Topic: General - Other >> Apr 15, 2018 11:15 AM Judyann Munson wrote: Reason for CRM: Patient is requesting a  calling back in regards to DULoxetine (CYMBALTA) 60 MG capsule she is getting this medication prescribe by another provider and she would like to have Dr.Burns be the primary provider to prescribe this medication.

## 2018-04-15 NOTE — Telephone Encounter (Signed)
Spoke to patient's daughter on Shirley Melendez).  She has further questions about her appt w/ Dr. Learta Codding concerning her B12 elevation. She is going to contact Dr. Carin Hock office for further discussion.

## 2018-04-16 ENCOUNTER — Telehealth: Payer: Self-pay | Admitting: *Deleted

## 2018-04-16 ENCOUNTER — Other Ambulatory Visit: Payer: Self-pay | Admitting: *Deleted

## 2018-04-16 DIAGNOSIS — Z853 Personal history of malignant neoplasm of breast: Secondary | ICD-10-CM

## 2018-04-16 NOTE — Telephone Encounter (Signed)
Telephone call returned to patients daughter. She expresses concern about having a PET scan done to check for recurrance. Explained to patient that this would not be clinically indicated at this time. Patient would be responsible for scan out of pocket. Daughter understands. She would like to have a CBC drawn to check blood counts since this was not completed with the other labs drawn. Dr. Benay Spice agrees. Ordered and scheduled lab appt for Friday. Patient advised of appt date and time.

## 2018-04-17 ENCOUNTER — Inpatient Hospital Stay: Payer: Medicare Other | Attending: Oncology

## 2018-04-17 DIAGNOSIS — Z853 Personal history of malignant neoplasm of breast: Secondary | ICD-10-CM

## 2018-04-17 DIAGNOSIS — Z170 Estrogen receptor positive status [ER+]: Secondary | ICD-10-CM | POA: Insufficient documentation

## 2018-04-17 DIAGNOSIS — C50912 Malignant neoplasm of unspecified site of left female breast: Secondary | ICD-10-CM | POA: Insufficient documentation

## 2018-04-17 LAB — CBC WITH DIFFERENTIAL (CANCER CENTER ONLY)
Abs Immature Granulocytes: 0.02 K/uL (ref 0.00–0.07)
Basophils Absolute: 0 K/uL (ref 0.0–0.1)
Basophils Relative: 0 %
Eosinophils Absolute: 0.1 K/uL (ref 0.0–0.5)
Eosinophils Relative: 1 %
HCT: 42.5 % (ref 36.0–46.0)
Hemoglobin: 14.3 g/dL (ref 12.0–15.0)
Immature Granulocytes: 0 %
Lymphocytes Relative: 13 %
Lymphs Abs: 1.5 K/uL (ref 0.7–4.0)
MCH: 31.2 pg (ref 26.0–34.0)
MCHC: 33.6 g/dL (ref 30.0–36.0)
MCV: 92.8 fL (ref 80.0–100.0)
Monocytes Absolute: 1 K/uL (ref 0.1–1.0)
Monocytes Relative: 8 %
Neutro Abs: 9.4 K/uL — ABNORMAL HIGH (ref 1.7–7.7)
Neutrophils Relative %: 78 %
Platelet Count: 312 K/uL (ref 150–400)
RBC: 4.58 MIL/uL (ref 3.87–5.11)
RDW: 12.2 % (ref 11.5–15.5)
WBC Count: 12 K/uL — ABNORMAL HIGH (ref 4.0–10.5)
nRBC: 0 % (ref 0.0–0.2)

## 2018-04-21 ENCOUNTER — Other Ambulatory Visit: Payer: Self-pay | Admitting: *Deleted

## 2018-04-21 ENCOUNTER — Telehealth: Payer: Self-pay | Admitting: *Deleted

## 2018-04-21 DIAGNOSIS — Z853 Personal history of malignant neoplasm of breast: Secondary | ICD-10-CM

## 2018-04-21 NOTE — Telephone Encounter (Signed)
Dr. Benay Spice will check CBC,smear and hold SST in next month. Orders placed and notified daughter via VM that scheduler will be calling her.

## 2018-04-22 ENCOUNTER — Telehealth: Payer: Self-pay | Admitting: Oncology

## 2018-04-22 NOTE — Telephone Encounter (Signed)
Scheduled appt per 11/5 sch message - pt is aware aware of appt date and time.

## 2018-04-23 ENCOUNTER — Telehealth: Payer: Self-pay | Admitting: Neurology

## 2018-04-23 NOTE — Telephone Encounter (Signed)
Pt requesting a call would like to discuss being referred to [facility]

## 2018-04-24 ENCOUNTER — Other Ambulatory Visit: Payer: Self-pay | Admitting: *Deleted

## 2018-04-24 ENCOUNTER — Telehealth: Payer: Self-pay | Admitting: *Deleted

## 2018-04-24 NOTE — Telephone Encounter (Signed)
Daughter called requesting to move her 05/22/18 lab appointment to within the next 2 weeks. She is not comfortable waiting until December. Also asking for MD to draw a panel to check her kidneys and liver functions. Informed her that her request and concerns will be forwarded to MD.

## 2018-04-27 ENCOUNTER — Telehealth: Payer: Self-pay | Admitting: *Deleted

## 2018-04-27 DIAGNOSIS — Z853 Personal history of malignant neoplasm of breast: Secondary | ICD-10-CM

## 2018-04-27 NOTE — Telephone Encounter (Signed)
I have talked to patient and I have sent her referral to Edgardo Roys [phone] .

## 2018-04-27 NOTE — Telephone Encounter (Signed)
Notified daughter that MD agrees to move lab to week of 11/18 and to add the requested Cmet. Sent scheduling message and to call patient with new appointment.

## 2018-04-28 ENCOUNTER — Telehealth: Payer: Self-pay | Admitting: Oncology

## 2018-04-28 NOTE — Telephone Encounter (Signed)
Called regarding 11/18 °

## 2018-05-04 ENCOUNTER — Inpatient Hospital Stay: Payer: Medicare Other

## 2018-05-04 DIAGNOSIS — Z853 Personal history of malignant neoplasm of breast: Secondary | ICD-10-CM

## 2018-05-04 DIAGNOSIS — C50912 Malignant neoplasm of unspecified site of left female breast: Secondary | ICD-10-CM | POA: Diagnosis not present

## 2018-05-04 LAB — CBC WITH DIFFERENTIAL (CANCER CENTER ONLY)
Abs Immature Granulocytes: 0.03 10*3/uL (ref 0.00–0.07)
Basophils Absolute: 0 10*3/uL (ref 0.0–0.1)
Basophils Relative: 1 %
EOS PCT: 1 %
Eosinophils Absolute: 0.1 10*3/uL (ref 0.0–0.5)
HCT: 44.8 % (ref 36.0–46.0)
HEMOGLOBIN: 15.1 g/dL — AB (ref 12.0–15.0)
Immature Granulocytes: 0 %
LYMPHS ABS: 1.4 10*3/uL (ref 0.7–4.0)
LYMPHS PCT: 16 %
MCH: 31.7 pg (ref 26.0–34.0)
MCHC: 33.7 g/dL (ref 30.0–36.0)
MCV: 93.9 fL (ref 80.0–100.0)
MONO ABS: 0.8 10*3/uL (ref 0.1–1.0)
MONOS PCT: 9 %
Neutro Abs: 6.5 10*3/uL (ref 1.7–7.7)
Neutrophils Relative %: 73 %
Platelet Count: 320 10*3/uL (ref 150–400)
RBC: 4.77 MIL/uL (ref 3.87–5.11)
RDW: 12.4 % (ref 11.5–15.5)
WBC Count: 8.9 10*3/uL (ref 4.0–10.5)
nRBC: 0 % (ref 0.0–0.2)

## 2018-05-04 LAB — CMP (CANCER CENTER ONLY)
ALT: 21 U/L (ref 0–44)
AST: 20 U/L (ref 15–41)
Albumin: 4 g/dL (ref 3.5–5.0)
Alkaline Phosphatase: 69 U/L (ref 38–126)
Anion gap: 8 (ref 5–15)
BUN: 15 mg/dL (ref 8–23)
CO2: 31 mmol/L (ref 22–32)
CREATININE: 1.17 mg/dL — AB (ref 0.44–1.00)
Calcium: 9.9 mg/dL (ref 8.9–10.3)
Chloride: 101 mmol/L (ref 98–111)
GFR, EST AFRICAN AMERICAN: 54 mL/min — AB (ref >60)
GFR, Estimated: 47 mL/min — ABNORMAL LOW (ref >60)
Glucose, Bld: 102 mg/dL — ABNORMAL HIGH (ref 70–99)
Potassium: 4.1 mmol/L (ref 3.5–5.1)
Sodium: 140 mmol/L (ref 135–145)
Total Bilirubin: 0.8 mg/dL (ref 0.3–1.2)
Total Protein: 6.9 g/dL (ref 6.5–8.1)

## 2018-05-04 LAB — SAVE SMEAR(SSMR), FOR PROVIDER SLIDE REVIEW

## 2018-05-07 ENCOUNTER — Telehealth: Payer: Self-pay | Admitting: *Deleted

## 2018-05-07 NOTE — Telephone Encounter (Signed)
-----   Message from Shirley Pier, MD sent at 05/07/2018  1:46 PM EST ----- Please call patient, repeat white blood cell count and white cell differential are normal okay to return as scheduled unless she wants to come in sooner to discuss the elevated B12 level, I do not think this is anything to be concerned about

## 2018-05-07 NOTE — Telephone Encounter (Signed)
Left VM for daughter with result message from Dr. Benay Spice. Instructed her to call if they want to be seen sooner than her 07/16/2018 appointment.

## 2018-05-22 ENCOUNTER — Other Ambulatory Visit: Payer: Medicare Other

## 2018-05-22 ENCOUNTER — Ambulatory Visit: Payer: Medicare Other | Admitting: Podiatry

## 2018-05-22 DIAGNOSIS — T451X5A Adverse effect of antineoplastic and immunosuppressive drugs, initial encounter: Secondary | ICD-10-CM

## 2018-05-22 DIAGNOSIS — G620 Drug-induced polyneuropathy: Secondary | ICD-10-CM | POA: Diagnosis not present

## 2018-05-22 DIAGNOSIS — B351 Tinea unguium: Secondary | ICD-10-CM | POA: Diagnosis not present

## 2018-05-22 DIAGNOSIS — M79674 Pain in right toe(s): Secondary | ICD-10-CM | POA: Diagnosis not present

## 2018-05-22 DIAGNOSIS — M79675 Pain in left toe(s): Secondary | ICD-10-CM | POA: Diagnosis not present

## 2018-05-22 NOTE — Patient Instructions (Signed)

## 2018-05-25 ENCOUNTER — Other Ambulatory Visit: Payer: Self-pay | Admitting: Internal Medicine

## 2018-05-25 ENCOUNTER — Other Ambulatory Visit: Payer: Self-pay | Admitting: Oncology

## 2018-05-25 DIAGNOSIS — I10 Essential (primary) hypertension: Secondary | ICD-10-CM

## 2018-05-25 DIAGNOSIS — Z853 Personal history of malignant neoplasm of breast: Secondary | ICD-10-CM

## 2018-06-17 HISTORY — PX: OTHER SURGICAL HISTORY: SHX169

## 2018-06-23 ENCOUNTER — Encounter: Payer: Self-pay | Admitting: Podiatry

## 2018-06-23 NOTE — Progress Notes (Signed)
Subjective: Shirley Melendez presents today referred by [name], MD with cc of painful, discolored, thick toenails bilateral great toes which interfere with daily activities. Duration is greater than 3 months. Pain is aggravated when wearing enclosed shoe gear.   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  Past Medical History:  Diagnosis Date  . Arthritis    chronic neck and back pain  . Breast cancer ([place]) 1997   surgery, chemo, & radiation  . Breast cancer ([facility]) 2012   surgery, chemo, & radiation with axillary dissection  . CTS (carpal tunnel syndrome) 2013   Dr [name] , Neurology  . Fasting hyperglycemia 2012   FBS 110  . Hyperlipidemia   . Hypertension   . 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 Memory loss   . MRSA (methicillin resistant staph aureus) culture positive 2012   swab was positive, treated per protocol.  No other issues.  . Osteopenia   . Personal history of chemotherapy 1997  . Personal history of radiation therapy 1997   x2  . PONV (postoperative nausea and vomiting) 1997   no problems since    Patient Active Problem List   Diagnosis Date Noted  . Mild cognitive impairment 04/02/2018  . High serum vitamin B12 04/02/2018  . Confusion 03/04/2018  . Hallucination 02/18/2018  . Neuropathy due to chemotherapeutic drug ([place]) 02/18/2018  . Sleep difficulties 02/18/2018  . Sciatica of left side 07/25/2017  . Prediabetes 12/11/2016  . Essential hypertension, benign 07/16/2013  . Lymphedema of upper extremity following lymphadenectomy 11/07/2011  . Nonspecific abnormal electrocardiogram (ECG) (EKG) 09/27/2011  . Cervicalgia 04/12/2009  . BACK PAIN, LUMBAR 04/12/2009  . Hyperlipidemia 08/24/2008  . Osteoarthritis 08/24/2008  . Osteopenia 08/24/2008  . hx: breast cancer, left, invasive ductal carcinoma with axillary mets, receptor + 08/24/2008    Past Surgical History:  Procedure  Laterality Date  . AXILLARY NODE DISSECTION  08/2010   left  . AXILLARY NODE DISSECTION Left 2012   with axillary dissection on left  . BREAST BIOPSY  2012   x2  . BREAST LUMPECTOMY  09/1995, 10/1995   left - lumpectomy   . BREAST LUMPECTOMY  2012   Lumpectomy Axilla   . BREAST SURGERY  2012    axillary node dissection left  . CARPAL TUNNEL RELEASE  10/29/2011   Procedure: CARPAL TUNNEL RELEASE;  Surgeon: Cammie Sickle., MD;  Location: [facility];  Service: Orthopedics;  Laterality: Right;  . COLONOSCOPY  2005   negative  . DILATION AND CURETTAGE, DIAGNOSTIC / THERAPEUTIC  2002  . KNEE SURGERY  2000   left  . TRIGGER FINGER RELEASE  10/29/2011   Procedure: RELEASE TRIGGER FINGER/A-1 PULLEY;  Surgeon: Cammie Sickle., MD;  Location: [facility];  Service: Orthopedics;  Laterality: Right;  right long and right index      Current Outpatient Medications:  .  Calcium Carbonate-Vitamin D (CALCIUM 600 + D PO), Take 1 capsule by mouth 2 (two) times daily. 600/400, Disp: , Rfl:  .  donepezil (ARICEPT) 10 MG tablet, Take 1 tablet (10 mg total) by mouth at bedtime., Disp: 30 tablet, Rfl: 11 .  DULoxetine (CYMBALTA) 60 MG capsule, Take 1 capsule (60 mg total) by mouth daily., Disp: 90 capsule, Rfl: 1 .  ibuprofen (ADVIL,MOTRIN) 200 MG tablet, Take 200 mg by mouth every 6 (six) hours as needed.  , Disp: , Rfl:  .  LYSINE PO, Take 500 mg by mouth as needed.  , Disp: , Rfl:  .  Multiple Vitamins-Minerals (SENIOR MULTIVITAMIN PLUS PO), Take by mouth daily.  , Disp: , Rfl:  .  spironolactone (ALDACTONE) 25 MG tablet, TAKE ONE TABLET BY MOUTH DAILY, Disp: 90 tablet, Rfl: 3 .  Vitamin E 200 UNITS TABS, Take 400 Units by mouth daily. , Disp: , Rfl:  .  anastrozole (ARIMIDEX) 1 MG tablet, TAKE ONE TABLET BY MOUTH DAILY, Disp: 90 tablet, Rfl: 1 .  metoprolol tartrate (LOPRESSOR) 50 MG tablet, TAKE ONE TABLET BY MOUTH TWICE A DAY, Disp: 180 tablet, Rfl: 1  Allergies   Allergen Reactions  . Compazine Other (See Comments)    Muscle spasms in neck & back with jaw tightness  . Trazodone And Nefazodone     Confusion and hallucinations  . Erythromycin Nausea And Vomiting  . Prochlorperazine Edisylate     nausea    Social History   Occupational History  . Occupation: Retired  Tobacco Use  . Smoking status: Never Smoker  . Smokeless tobacco: Never Used  Substance and Sexual Activity  . Alcohol use: No    Alcohol/week: 0.0 standard drinks  . Drug use: No  . Sexual activity: Not on file    Family History  Problem Relation Age of Onset  . Hypertension Mother   . Arthritis Mother        OA  . Other Father        unknown medical history  . Arthritis Maternal Grandfather        OA  . Heart attack Maternal Grandfather 87  . Heart attack Maternal Uncle 65  . Stroke Neg Hx   . Diabetes Neg Hx   . Breast cancer Neg Hx     Immunization History  Administered Date(s) Administered  . Influenza Split 03/18/2011  . Influenza Whole 04/01/2007, 03/10/2008  . Influenza, High Dose Seasonal PF 03/07/2015, 02/23/2016, 04/01/2017, 02/18/2018  . Influenza,inj,quad, With Preservative 02/17/2014  . Influenza-Unspecified 02/15/2013, 02/15/2014  . Pneumococcal Conjugate-13 10/10/2015  . Pneumococcal Polysaccharide-23 12/11/2016  . Td 08/24/2008     Review of systems: Positive Findings in bold print.  Constitutional:  chills, fatigue, fever, sweats, weight change Communication: Optometrist, sign Ecologist, hand writing, iPad/Android device Eyes: diplopia, glare,  light sensitivity, eyeglasses, blindness Ears nose mouth throat: Hard of hearing, deaf, sign language,  vertigo,   bloody nose,  rhinitis,  cold sores, snoring Cardiovascular: HTN, edema, arrhythmia, pacemaker in place, defibrillator in place,  chest pain/tightness, chronic anticoagulation, blood clot Respiratory:  difficulty breathing, denies congestion, SOB, wheezing,  cough Gastrointestinal: abdominal pain, diarrhea, nausea, vomiting,  Genitourinary:  nocturia,  pain on urination,  blood in urine, Foley catheter, urinary urgency Musculoskeletal: Uses mobility aid,  cramping, stiff joints, painful joints,  Skin: +changes in toenails, color change dryness, itchy skin, mole changes, or rash  Neurological: numbness, paresthesias, burning in feet, denies fainting,  seizure, change in speech. denies headaches, memory problems/poor historian, cerebral palsy Endocrine: diabetes, hypothyroidism, hyperthyroidism,  dry mouth, flushing, denies heat intolerance,  cold intolerance,  excessive thirst, denies polyuria,  nocturia Hematological:  easy bleeding,  excessive bleeding, easy bruising, enlarged lymph nodes, on long term blood thinner Allergy/immunological:  hive, frequent infections, multiple drug allergies, seasonal allergies,  Psychiatric:  anxiety, depression, mood disorder, suicidal ideations, hallucinations   Objective: Vascular Examination: Capillary refill time immediate x 10 digits Dorsalis pedis and posterior tibial pulses present b/l No digital hair x 10 digits Skin temperature  gradient WNL b/l  Dermatological Examination: Skin with normal turgor, texture and tone b/l  Toenails b/l great toes discolored, thick, dystrophic with subungual debris and pain with palpation to nailbeds due to thickness of nails.  Musculoskeletal: Muscle strength 5/5 to all LE muscle groups  Neurological: Sensation intact with 10 gram monofilament Vibratory sensation intact.   Assessment: 1. Painful onychomycosis toenails bilateral great toes 2. Chemotherapy induced neuropathy  Plan: 1. Discussed onychomycosis and treatment options.  Literature dispensed on today. 2. Toenails b/l great toes were debrided in length and girth without iatrogenic bleeding. 3. Patient to continue soft, supportive shoe gear 4. Patient to report any pedal injuries to medical  professional immediately. 5. Follow up 3 months. Patient/POA to call should there be a concern in the interim.

## 2018-07-10 ENCOUNTER — Encounter: Payer: Self-pay | Admitting: Oncology

## 2018-07-13 ENCOUNTER — Other Ambulatory Visit: Payer: Self-pay | Admitting: *Deleted

## 2018-07-13 DIAGNOSIS — Z853 Personal history of malignant neoplasm of breast: Secondary | ICD-10-CM

## 2018-07-16 ENCOUNTER — Inpatient Hospital Stay: Payer: Medicare Other | Attending: Oncology | Admitting: Oncology

## 2018-07-16 ENCOUNTER — Inpatient Hospital Stay: Payer: Medicare Other

## 2018-07-16 ENCOUNTER — Telehealth: Payer: Self-pay | Admitting: Oncology

## 2018-07-16 VITALS — BP 114/75 | HR 71 | Temp 99.0°F | Resp 18 | Ht 60.0 in | Wt 111.9 lb

## 2018-07-16 DIAGNOSIS — Z170 Estrogen receptor positive status [ER+]: Secondary | ICD-10-CM | POA: Diagnosis not present

## 2018-07-16 DIAGNOSIS — E538 Deficiency of other specified B group vitamins: Secondary | ICD-10-CM | POA: Diagnosis not present

## 2018-07-16 DIAGNOSIS — Z79811 Long term (current) use of aromatase inhibitors: Secondary | ICD-10-CM | POA: Diagnosis not present

## 2018-07-16 DIAGNOSIS — M79645 Pain in left finger(s): Secondary | ICD-10-CM

## 2018-07-16 DIAGNOSIS — C50912 Malignant neoplasm of unspecified site of left female breast: Secondary | ICD-10-CM | POA: Diagnosis not present

## 2018-07-16 DIAGNOSIS — G5603 Carpal tunnel syndrome, bilateral upper limbs: Secondary | ICD-10-CM

## 2018-07-16 DIAGNOSIS — M79644 Pain in right finger(s): Secondary | ICD-10-CM | POA: Diagnosis not present

## 2018-07-16 DIAGNOSIS — M8580 Other specified disorders of bone density and structure, unspecified site: Secondary | ICD-10-CM

## 2018-07-16 DIAGNOSIS — R4182 Altered mental status, unspecified: Secondary | ICD-10-CM | POA: Diagnosis not present

## 2018-07-16 DIAGNOSIS — M7989 Other specified soft tissue disorders: Secondary | ICD-10-CM | POA: Diagnosis not present

## 2018-07-16 DIAGNOSIS — Z853 Personal history of malignant neoplasm of breast: Secondary | ICD-10-CM

## 2018-07-16 DIAGNOSIS — I10 Essential (primary) hypertension: Secondary | ICD-10-CM | POA: Diagnosis not present

## 2018-07-16 DIAGNOSIS — I890 Lymphedema, not elsewhere classified: Secondary | ICD-10-CM

## 2018-07-16 LAB — CBC WITH DIFFERENTIAL (CANCER CENTER ONLY)
ABS IMMATURE GRANULOCYTES: 0.03 10*3/uL (ref 0.00–0.07)
BASOS PCT: 0 %
Basophils Absolute: 0 10*3/uL (ref 0.0–0.1)
EOS ABS: 0.1 10*3/uL (ref 0.0–0.5)
EOS PCT: 1 %
HCT: 42.6 % (ref 36.0–46.0)
Hemoglobin: 14.3 g/dL (ref 12.0–15.0)
Immature Granulocytes: 0 %
Lymphocytes Relative: 17 %
Lymphs Abs: 1.4 10*3/uL (ref 0.7–4.0)
MCH: 31.2 pg (ref 26.0–34.0)
MCHC: 33.6 g/dL (ref 30.0–36.0)
MCV: 92.8 fL (ref 80.0–100.0)
MONO ABS: 0.7 10*3/uL (ref 0.1–1.0)
MONOS PCT: 10 %
Neutro Abs: 5.5 10*3/uL (ref 1.7–7.7)
Neutrophils Relative %: 72 %
PLATELETS: 260 10*3/uL (ref 150–400)
RBC: 4.59 MIL/uL (ref 3.87–5.11)
RDW: 12.2 % (ref 11.5–15.5)
WBC: 7.8 10*3/uL (ref 4.0–10.5)
nRBC: 0 % (ref 0.0–0.2)

## 2018-07-16 LAB — BASIC METABOLIC PANEL - CANCER CENTER ONLY
Anion gap: 8 (ref 5–15)
BUN: 19 mg/dL (ref 8–23)
CALCIUM: 9.2 mg/dL (ref 8.9–10.3)
CO2: 29 mmol/L (ref 22–32)
Chloride: 102 mmol/L (ref 98–111)
Creatinine: 1.16 mg/dL — ABNORMAL HIGH (ref 0.44–1.00)
GFR, Est AFR Am: 56 mL/min — ABNORMAL LOW (ref >60)
GFR, Estimated: 48 mL/min — ABNORMAL LOW (ref >60)
Glucose, Bld: 92 mg/dL (ref 70–99)
Potassium: 4.2 mmol/L (ref 3.5–5.1)
Sodium: 139 mmol/L (ref 135–145)

## 2018-07-16 LAB — VITAMIN B12: Vitamin B-12: 1170 pg/mL — ABNORMAL HIGH (ref 180–914)

## 2018-07-16 NOTE — Telephone Encounter (Signed)
Scheduled appt per 01/30 los. ° °Printed calendar and avs. °

## 2018-07-16 NOTE — Progress Notes (Signed)
Shirley Melendez   Diagnosis: Breast cancer  INTERVAL HISTORY:   Shirley Melendez returns as scheduled.  She continues Arimidex.  No change over either breast.  She tends to have swelling of the left arm and wears a lymphedema sleeve most of the time. She saw Dr. [name] last year after developing hallucinations.  An MRI of the brain revealed no acute change.  There is brain atrophy.  She has been referred for neuropsychiatric evaluation.  She reports the symptoms improved when she discontinued trazodone. She was found to have an elevated vitamin B12 level as part of the neurologic work-up. Objective:  Vital signs in last 24 hours:  Blood pressure 114/75, pulse 71, temperature 99 F (37.2 C), temperature source Oral, resp. rate 18, height 5' (1.524 m), weight 111 lb 14.4 oz (50.8 kg), SpO2 98 %.    HEENT: Neck without mass Lymphatics: No cervical, supraclavicular, or axillary nodes Resp: Lungs clear bilaterally Cardio: Regular rate and rhythm GI: No hepatosplenomegaly Vascular: No leg edema, lymphedema sleeve at the left arm Breast: No mass in either breast.  Status post left lumpectomy.  No evidence for local tumor recurrence.  Lab Results:  Lab Results  Component Value Date   WBC 7.8 07/16/2018   HGB 14.3 07/16/2018   HCT 42.6 07/16/2018   MCV 92.8 07/16/2018   PLT 260 07/16/2018   NEUTROABS 5.5 07/16/2018    CMP  Lab Results  Component Value Date   NA 139 07/16/2018   K 4.2 07/16/2018   CL 102 07/16/2018   CO2 29 07/16/2018   GLUCOSE 92 07/16/2018   BUN 19 07/16/2018   CREATININE 1.16 (H) 07/16/2018   CALCIUM 9.2 07/16/2018   PROT 6.9 05/04/2018   ALBUMIN 4.0 05/04/2018   AST 20 05/04/2018   ALT 21 05/04/2018   ALKPHOS 69 05/04/2018   BILITOT 0.8 05/04/2018   GFRNONAA 48 (L) 07/16/2018   GFRAA 56 (L) 07/16/2018     Medications: I have reviewed the patient's current medications.   Assessment/Plan: 1. Stage I (T1 NX) left-sided  breast cancer diagnosed in April 1997. She was treated with a lumpectomy, left breast radiation, CMF chemotherapy, and 5 years of tamoxifen. 2. Metastatic carcinoma involving a left axillary lymph node, ER-positive, PR-positive, and HER-2-negative. She is status post a biopsy of a left axillary lymph node 08/13/2010. Status post a left axillary lymph node dissection 09/13/2010 with the pathology confirming 3/11 axillary lymph nodes containing metastatic carcinoma. A staging PET scan on 08/23/2010 confirmed a hypermetabolic left axillary lymph node and no other evidence of metastatic disease.  1. Status post 4 cycles of "adjuvant" T-C chemotherapy with cycle #4 given on 01/07/2011. 2. She began Arimidex on 03/26/2011 3. Left arm lymphedema, likely lymphedema related to the left axillary lymph node dissection. She is now followed at the lymphedema clinic. Improved with physical therapy and a lymphedema sleeve. 4. Pain and numbness at the fingers bilaterally-right greater than left, she reports being diagnosed with carpal tunnel syndrome and underwent a right sided carpal tunnel surgery with clinical improvement  5. Osteopenia-she received Zometa in July of 2018.bone density scanMay 2018confirmed osteopenia at the femoral neck 6. Hypertension 7.  Altered mental status 2019-undergoing evaluation by neurology 8.  Elevated vitamin B12 level 04/02/2018- nonspecific     Disposition: Shirley Melendez remains in clinical remission from breast cancer.  She will continue Arimidex.  The CBC is normal today.  I suspect the elevated vitamin B12 level is a nonspecific  finding.  We obtained a repeat vitamin B12 level today.  Shirley Melendez will return for an office visit and Zometa in 6 months.  Shirley Coder, MD  07/16/2018  4:15 PM

## 2018-07-24 ENCOUNTER — Ambulatory Visit: Payer: Medicare Other | Admitting: Podiatry

## 2018-07-31 ENCOUNTER — Ambulatory Visit (INDEPENDENT_AMBULATORY_CARE_PROVIDER_SITE_OTHER): Payer: Medicare Other | Admitting: Podiatry

## 2018-07-31 DIAGNOSIS — B351 Tinea unguium: Secondary | ICD-10-CM

## 2018-07-31 DIAGNOSIS — M79675 Pain in left toe(s): Secondary | ICD-10-CM

## 2018-07-31 DIAGNOSIS — M79674 Pain in right toe(s): Secondary | ICD-10-CM

## 2018-08-05 NOTE — Progress Notes (Signed)
Pt presents with mycotic infection of nails 1-5 bilateral  All other systems are negative  Laser therapy administered to affected nails and tolerated well. All safety precautions were in place.  2nd treatment. Patient's first 3 treatments will be no charge, due to uncertainty of results.  Follow up in 4 weeks

## 2018-08-19 ENCOUNTER — Telehealth: Payer: Self-pay | Admitting: Neurology

## 2018-08-19 NOTE — Telephone Encounter (Signed)
Pt states she is still having continuous side effects with her donepezil (ARICEPT) 10 MG tablet pt would like a call back and be advised on what the next step should be with this medication.

## 2018-08-19 NOTE — Telephone Encounter (Addendum)
Returned call to patient.  States since starting donepezil in October 2019, she has continued to have an upset stomach daily (cramping, loose stools).  It is not as severe as when she initially started it but has never resolved completely.  In addition to this concern, states she is more restless at night (her husband says she has been kicking in her sleep, she has fallen out of the bed four times).  No vivid dreams. She decided to stop the medication two days ago.  Says her stomach is feeling better.  She would like to know if there is another medication she can try for her memory.

## 2018-08-19 NOTE — Telephone Encounter (Signed)
Per vo by Dr. [name], she may try memantine 10mg , one tablet BID.  Returned call to patient and offered the new medication.  She has a pending appt on 11/02/2018 and would now like to hold off starting anything new until she is seen again.  She would also like to move forward with the neuropsychiatric testing.

## 2018-08-20 NOTE — Telephone Encounter (Signed)
Estill [place] [phone]  Has denied seeing patient. Did not give a reason. I have called and spoke to patient and her husband and they don't want to drive outside of [place] and she states she does not want to the the testing . I relayed to them that Dr. [name] would want this Testing done before her follow up . Patient states she has to much going on at this time.

## 2018-08-27 ENCOUNTER — Other Ambulatory Visit: Payer: Medicare Other

## 2018-09-03 ENCOUNTER — Ambulatory Visit: Payer: Medicare Other

## 2018-09-03 ENCOUNTER — Other Ambulatory Visit: Payer: Self-pay

## 2018-09-03 DIAGNOSIS — M79674 Pain in right toe(s): Principal | ICD-10-CM

## 2018-09-03 DIAGNOSIS — B351 Tinea unguium: Secondary | ICD-10-CM

## 2018-09-03 DIAGNOSIS — M79675 Pain in left toe(s): Principal | ICD-10-CM

## 2018-09-04 NOTE — Progress Notes (Signed)
Pt presents with mycotic infection of nails 1-5 bilateral  All other systems are negative  Laser therapy administered to affected nails and tolerated well. All safety precautions were in place.  3rd treatment. Patient's first 3 treatments will be no charge, due to uncertainty of results.  Follow up in 4 weeks

## 2018-09-10 ENCOUNTER — Encounter: Payer: Self-pay | Admitting: Podiatry

## 2018-10-01 ENCOUNTER — Other Ambulatory Visit: Payer: Medicare Other

## 2018-10-02 ENCOUNTER — Other Ambulatory Visit: Payer: Self-pay

## 2018-10-02 ENCOUNTER — Ambulatory Visit: Payer: Medicare Other

## 2018-10-02 DIAGNOSIS — M79674 Pain in right toe(s): Principal | ICD-10-CM

## 2018-10-02 DIAGNOSIS — M79675 Pain in left toe(s): Principal | ICD-10-CM

## 2018-10-02 DIAGNOSIS — B351 Tinea unguium: Secondary | ICD-10-CM

## 2018-10-05 ENCOUNTER — Telehealth: Payer: Self-pay | Admitting: Neurology

## 2018-10-05 ENCOUNTER — Ambulatory Visit: Payer: Medicare Other | Admitting: Neurology

## 2018-10-05 NOTE — Telephone Encounter (Signed)
I called Shirley Melendez, had an extended conversation with her. She has many questions about if she can start namenda. She originally wanted to wait until her appt in May to discuss if further but now is reconsidering that. She has declined to participate in neuro psych testing right now. I offered Shirley Melendez a sooner appt with Dr. [name] but Shirley Melendez does not have any virtual capabilities. She is asking for a telephone visit.  Shirley Melendez understands that although there may be some limitations with this type of visit, we will take all precautions to reduce any security or privacy concerns.  Shirley Melendez understands that this will be treated like an in office visit and we will file with Shirley Melendez's insurance, and there may be a patient responsible charge related to this service.  Appt was scheduled for 10/07/2018 at 11:30am. Please call [phone]. May 2020 appt was cancelled.

## 2018-10-05 NOTE — Telephone Encounter (Signed)
Pt called in to discuss med list , she states she has some questions regarding Namenda and wants to know if that will be a good option for her

## 2018-10-07 ENCOUNTER — Other Ambulatory Visit: Payer: Self-pay

## 2018-10-07 ENCOUNTER — Ambulatory Visit (INDEPENDENT_AMBULATORY_CARE_PROVIDER_SITE_OTHER): Payer: Medicare Other | Admitting: Neurology

## 2018-10-07 ENCOUNTER — Encounter: Payer: Self-pay | Admitting: Neurology

## 2018-10-07 DIAGNOSIS — R410 Disorientation, unspecified: Secondary | ICD-10-CM | POA: Diagnosis not present

## 2018-10-07 DIAGNOSIS — R443 Hallucinations, unspecified: Secondary | ICD-10-CM

## 2018-10-07 DIAGNOSIS — Z853 Personal history of malignant neoplasm of breast: Secondary | ICD-10-CM

## 2018-10-07 NOTE — Progress Notes (Signed)
PATIENT: Shirley Melendez DOB: March 31, 1951   HISTORICAL  FLORENCE YEUNG is a 68 year old female, seen in request by [name], [name] for evaluation of memory loss, she is accompanied by her [name] at today's clinical visit, initial evaluation was on March 04, 2018.  I have reviewed and summarized the referring note from the referring physician, she had a history of left breast cancer, [name], stage I left-sided breast cancer in April 1997, treated with lobectomy radiation, CMF chemotherapy, 5 years of tamoxifen, metastatic carcinoma involving left axillary lymph node, had left axillary lymph node dissection on September 13, 2010, status post 4 cycle adjuvant TC chemotherapy, completed on January 07, 2011, begin Arimidex in December 2012, left arm lymphedema,  She is a retired Art therapist, enjoys reading, had a sudden onset memory change since May 2019, she has chronic insomnia, was given a prescription of trazodone in May 2019 to June 2019, which did not help her sleep much, but during that period of time, she began to have spells of hallucinations, seeing people at her house, woke up in the middle of the night confused, staring at her husband could not recognize him, spell lasted for few minutes, intermittent,  Since trazodone was stopped, she remains symptomatic, sometimes while eating breakfast, she is confused where she is, could not recognize her husband  UPDATE Apr 02 2018: She is accompanied by her husband and daughter [name] at today's visit, we personally reviewed MRI of the brain with without contrast on March 21, 2018, there is evidence of generalized atrophy, progressed compared to previous CT scan in 2010, mild supratentorium small vessel disease.  She has stopped trazodone since August 2019, she continue have episode of confusion, she gave me an example, one afternoon she came back from outside to her house, saw a man she seems  to know before trying explain to her that she married to him for 41 years, there are three different mans, husband explained that it was him trying to explains to her what is going on, she was confused.  She denies significant memory loss, but has intermittent confusion spells,  Laboratory evaluations showed elevated B12 1698, mild elevated creatinine 1.02, elevated total cholesterol 227, LDL 128, normal CBC, TSH, A1c was 5.8,  Virtual Visit via telephone  I connected with Neomia Glass on 10/07/18 at  by telephone and verified that I am speaking with the correct person using two identifiers.   I discussed the limitations, risks, security and privacy concerns of performing an evaluation and management service by telephone and the availability of in person appointments. I also discussed with the patient that there may be a patient responsible charge related to this service. The patient expressed understanding and agreed to proceed.   History of Present Illness: She is with her husband during the interview, reviewed her medication list, she is only taking arimidex 1mg  daily, cal-Vit D, cymbalta 60mg  daily, lysine 500mg  prn, metoprolol 50mg  daily, MVI, spironolactone 25mg  daily. Vit E.  She has tried aricept, complains of dizziness and GI side effect, never started on namenda, was not able to complete neuropsychiatric evaluations,   She is doing better, she eats well, sleeps well now, she still cooks, work on plant in her garden, likes to read, fiction, historical stories. She is not exercise regularly.  She does not have hallucinations, occasionally floaters.    Observations/Objective: I have reviewed problem lists, medications, allergies.  Laboratory evaluations in  Jan 2020, Vit B12 1170, BMP, Creat 1.16, normal CBC  Assessment and Plan:  Intermittent confusion, History of metastatic breast carcinoma involving axillary lymph node  MRI of the brain with without contrast showed evidence of  generalized atrophy, minimum supratentorium small vessel disease,  Moca SCORE was 22/30, there was no significant short-term memory loss, but there is evidence of visual-spatial/executive, abstraction impairment in Oct 2019  Was previously consider central nervous system degenerative disorder, not typical for Alzheimer's disease, differentiation diagnosis including frontotemporal dementia,  Discuss with patient and her husband will hold off neuropsychiatric evaluation, and medication for now  Follow Up Instructions:  Prn    I discussed the assessment and treatment plan with the patient. The patient was provided an opportunity to ask questions and all were answered. The patient agreed with the plan and demonstrated an understanding of the instructions.   The patient was advised to call back or seek an in-person evaluation if the symptoms worsen or if the condition fails to improve as anticipated.  I provided 16 minutes of non-face-to-face time during this encounter.   Marcial Pacas, MD

## 2018-10-17 IMAGING — CT CT HEAD W/O CM
3 series · 15 of 47 positions shown, 18 images · non-contrast
Comparison: None.

CLINICAL DATA: Memory problems x1 month

EXAM:
CT HEAD WITHOUT CONTRAST
TECHNIQUE: Contiguous axial images were obtained from the base of the skull
through the vertex without intravenous contrast.

[Series 2: head wo · axial · 0.47mm/px · z∈[+1588,+1718]mm · 9 of 32 slices shown, 12 images]
[im 3/32  brain]
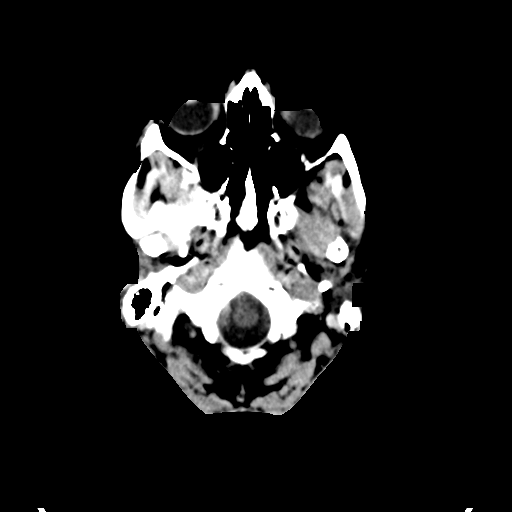
[im 3/32  bone]
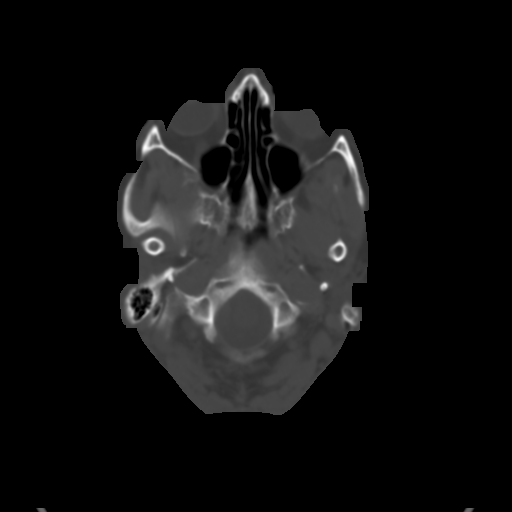
[im 6/32  brain]
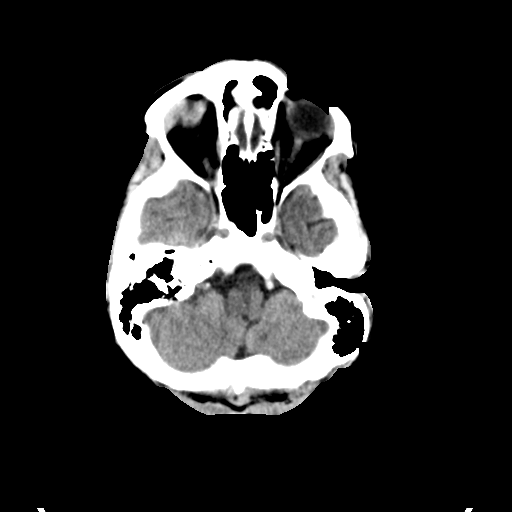
[im 9/32  brain]
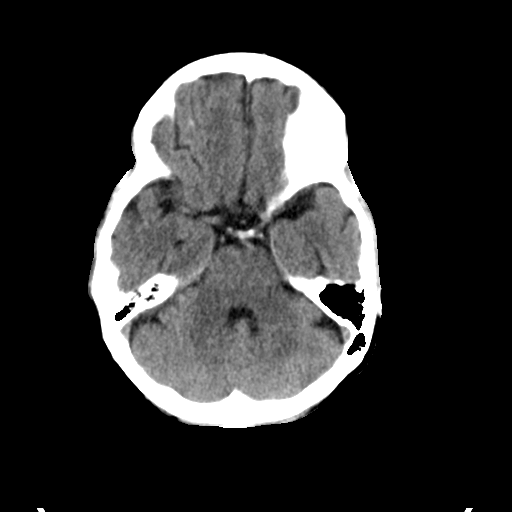
[im 12/32  brain]
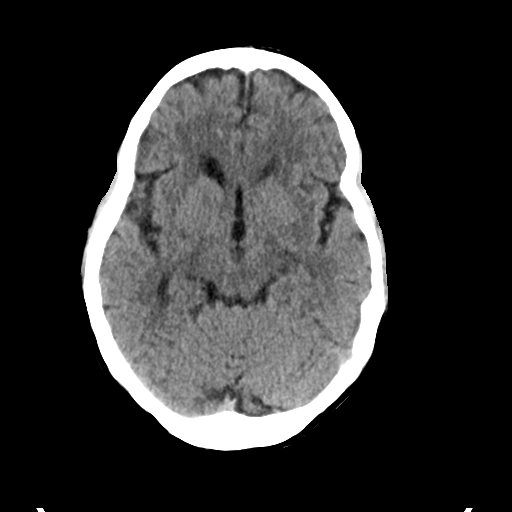
[im 17/32  brain]
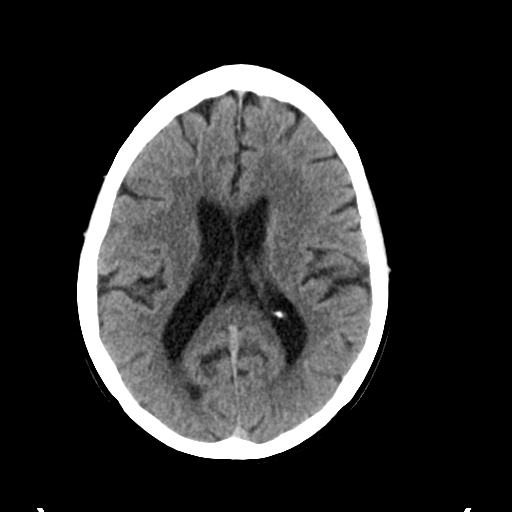
[im 17/32  bone]
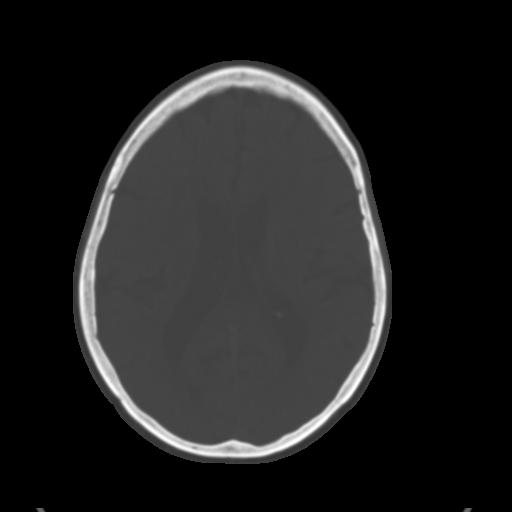
[im 20/32  brain]
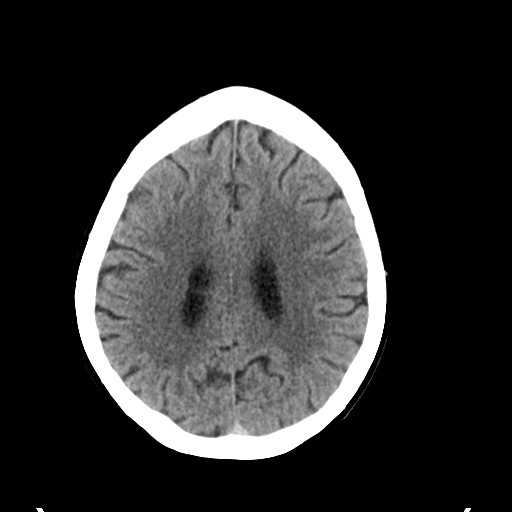
[im 23/32  brain]
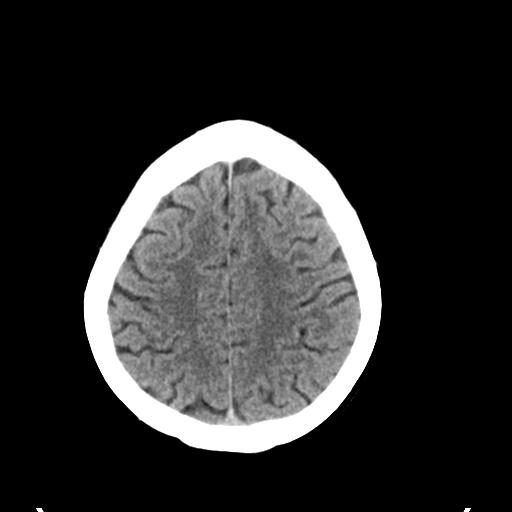
[im 26/32  brain]
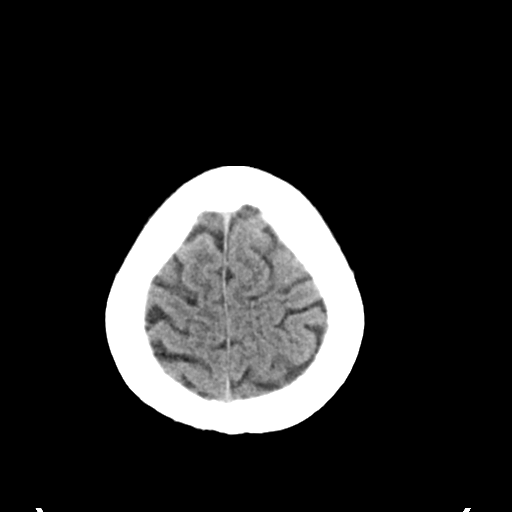
[im 29/32  brain]
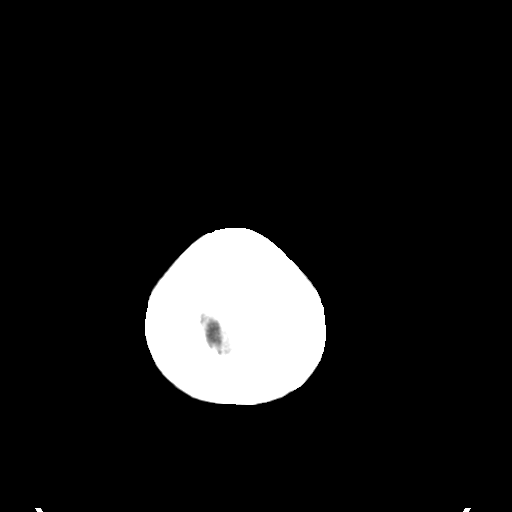
[im 29/32  bone]
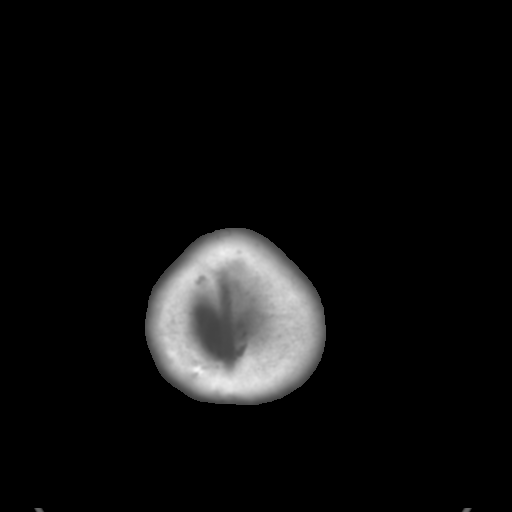

[Series 4: coronal soft tissue · coronal · 0.38mm/px · 3 of 65 slices shown]
[im 22/65  brain]
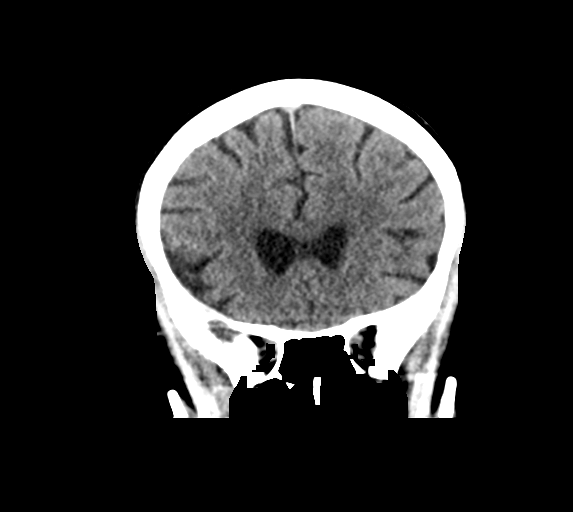
[im 29/65  brain]
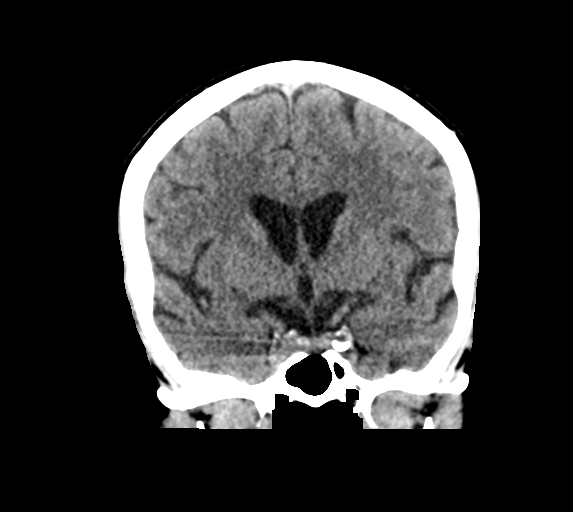
[im 36/65  brain]
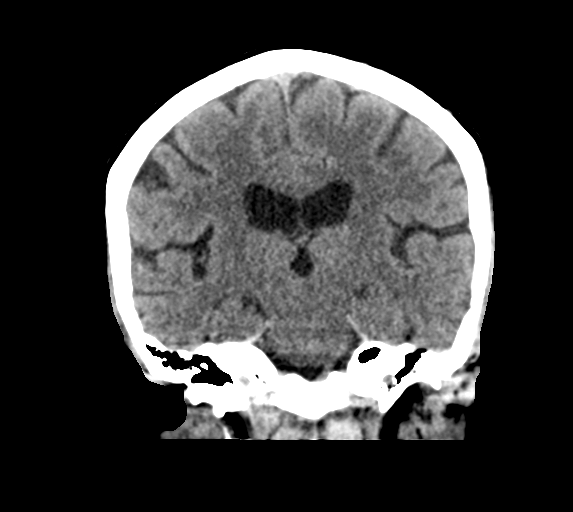

[Series 5: sagittal soft tissue · sagittal · 0.35mm/px · 3 of 51 slices shown]
[im 17/51  brain]
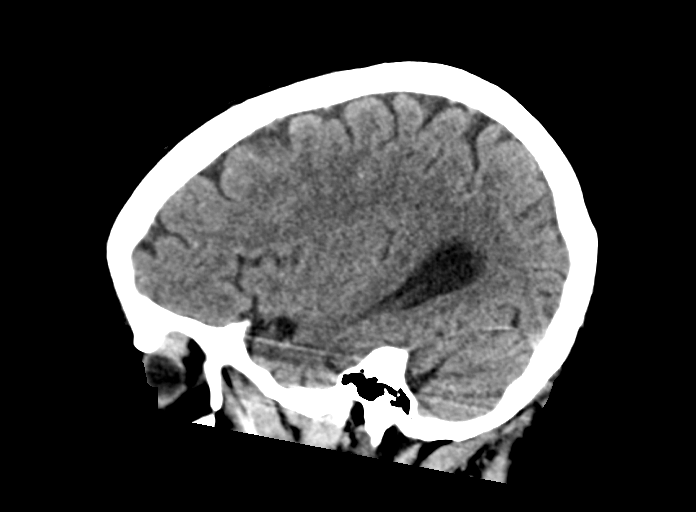
[im 26/51  brain]
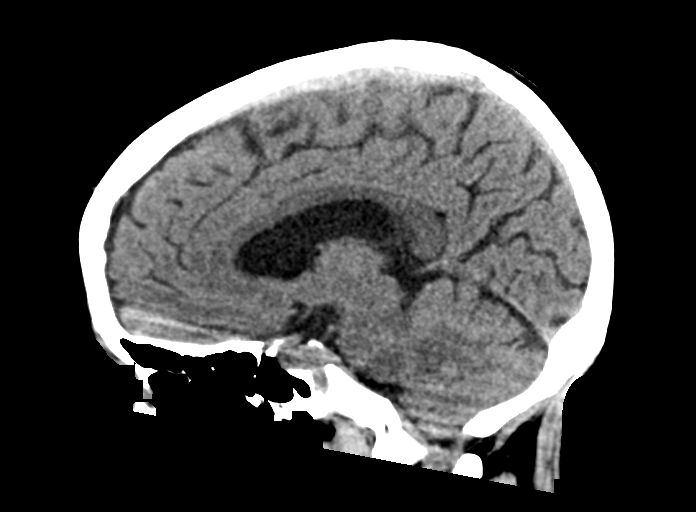
[im 34/51  brain]
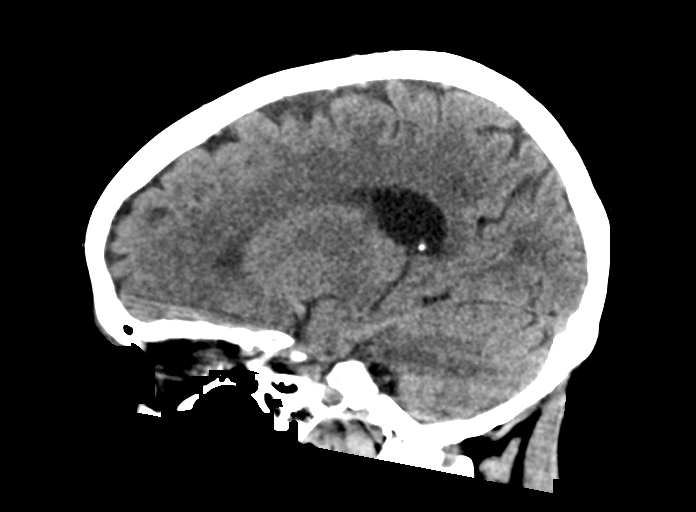

[15 of 47 positions shown; findings below may reference images not displayed]

FINDINGS: Brain: No evidence of acute infarction, hemorrhage, hydrocephalus,
extra-axial collection or mass lesion/mass effect.

Mild subcortical white matter and periventricular small vessel
ischemic changes.

Vascular: Mild intracranial atherosclerosis.

Skull: Normal. Negative for fracture or focal lesion.

Sinuses/Orbits: The visualized paranasal sinuses are essentially
clear. The mastoid air cells are unopacified.

Other: None.
IMPRESSION: No evidence of acute intracranial abnormality.

Mild small vessel ischemic changes.

## 2018-10-18 ENCOUNTER — Other Ambulatory Visit: Payer: Self-pay | Admitting: Oncology

## 2018-10-18 ENCOUNTER — Other Ambulatory Visit: Payer: Self-pay | Admitting: Internal Medicine

## 2018-10-18 DIAGNOSIS — I10 Essential (primary) hypertension: Secondary | ICD-10-CM

## 2018-10-18 DIAGNOSIS — Z853 Personal history of malignant neoplasm of breast: Secondary | ICD-10-CM

## 2018-10-19 ENCOUNTER — Telehealth: Payer: Self-pay | Admitting: Neurology

## 2018-10-19 MED ORDER — MEMANTINE HCL 5 MG PO TABS: 5.0000 mg | ORAL_TABLET | Freq: Two times a day (BID) | ORAL | 11 refills | Status: DC

## 2018-10-19 NOTE — Telephone Encounter (Signed)
Pt would like to discuss her starting on Namenda. Please advise.

## 2018-10-19 NOTE — Telephone Encounter (Signed)
I returned the call to the patient and she is in agreement to try memantine 5mg , one tab BID.

## 2018-10-19 NOTE — Telephone Encounter (Signed)
I have started namenda 5mg  bid.  memantine (NAMENDA) 5 MG tablet 5 mg, 2 times daily 11 ordered        Summary: Take 1 tablet (5 mg total) by mouth 2 (two) times daily., Starting Mon 10/19/2018, Normal  Dose, Route, Frequency: 5 mg, Oral, 2 times daily Start: 10/19/2018 Ord/Sold: 10/19/2018 (O) Report Taking:  Long-term:  Ph[facility]acy: [facility] [address], [place] - [address] Med Dose History

## 2018-10-19 NOTE — Telephone Encounter (Signed)
I spoke to the patient and she has decided she would like to try memantine.  She is concerned about starting on a full dosage because she is often sensitive to medication side effects.  She would like to know what you recommend.

## 2018-10-19 NOTE — Addendum Note (Signed)
Addended by: Marcial Pacas on: 10/19/2018 02:02 PM   Modules accepted: Orders

## 2018-10-26 NOTE — Telephone Encounter (Signed)
Pt called in and stated she has some questions about memantine 5mg 

## 2018-10-26 NOTE — Telephone Encounter (Signed)
Called, home number. Went to VM but unable to LVM. Phone call ends prior to being able to.  Tried mobile number at [phone]. LVM for pt to call back.

## 2018-10-27 NOTE — Telephone Encounter (Signed)
She started memantine 5mg  BID,  one week ago.  States she is tolerating the medication well with no adverse side effects. She has been concerned about intermittent confusion, including being forgetful of names. She will keep [place] update on her progress.

## 2018-11-02 ENCOUNTER — Ambulatory Visit: Payer: Self-pay | Admitting: Neurology

## 2018-11-03 NOTE — Progress Notes (Unsigned)
This encounter was created in error - please disregard.

## 2018-11-30 ENCOUNTER — Telehealth: Payer: Self-pay | Admitting: Neurology

## 2018-11-30 MED ORDER — MEMANTINE HCL 10 MG PO TABS: 10.0000 mg | ORAL_TABLET | Freq: Two times a day (BID) | ORAL | 5 refills | Status: DC

## 2018-11-30 NOTE — Telephone Encounter (Signed)
Left messages, requesting a return call, on both home and mobile number.

## 2018-11-30 NOTE — Telephone Encounter (Signed)
I have spoken to the patient.  She is still have occasional confusion spells but overall feels she is doing better with Namenda 5mg  BID.  Since she is tolerating the mediation so well, she is requesting to try Namenda 10mg  BID.

## 2018-11-30 NOTE — Telephone Encounter (Signed)
Per vo by Dr. [name], provide new rx for Namenda 10mg , one tablet BID.  The patient is aware this has been sent to the pharmacy for her.  She declined to schedule a six month follow up today.  She is waiting for the date of her cataracts surgery then will call back.  When she calls back, please schedule her follow up in October with Dr. [name] or Butler Denmark, NP.

## 2018-11-30 NOTE — Telephone Encounter (Signed)
Pt called wanting to report on her memantine (NAMENDA) 5 MG tablet.  Pt states she is still having confusion. Pt would like RN to call her back to discuss further about this medication.

## 2018-12-04 ENCOUNTER — Other Ambulatory Visit: Payer: Self-pay

## 2018-12-04 ENCOUNTER — Ambulatory Visit: Payer: Medicare Other | Admitting: Podiatry

## 2018-12-04 ENCOUNTER — Encounter: Payer: Self-pay | Admitting: Podiatry

## 2018-12-04 VITALS — Temp 98.1°F

## 2018-12-04 DIAGNOSIS — M79674 Pain in right toe(s): Secondary | ICD-10-CM | POA: Diagnosis not present

## 2018-12-04 DIAGNOSIS — B351 Tinea unguium: Secondary | ICD-10-CM | POA: Diagnosis not present

## 2018-12-04 DIAGNOSIS — M79675 Pain in left toe(s): Secondary | ICD-10-CM

## 2018-12-04 NOTE — Patient Instructions (Signed)

## 2018-12-08 ENCOUNTER — Ambulatory Visit: Payer: Medicare Other | Admitting: Podiatry

## 2018-12-15 ENCOUNTER — Ambulatory Visit: Payer: Medicare Other

## 2018-12-15 NOTE — Progress Notes (Signed)
Subjective:  Shirley Melendez presents to clinic today with cc of  painful, thick, discolored, elongated toenails 1-5 b/l that become tender and cannot cut because of thickness. Pain is aggravated when wearing enclosed shoe gear.  She has had 4 laser treatment sessions for her onychomycosis.  She feels she has had slight improvement.  She has h/o chemotherapy induced neuropathy.  [name], MD is her PCP.    Allergies  Allergen Reactions  . Compazine Other (See Comments)    Muscle spasms in neck & back with jaw tightness  . Donepezil Other (See Comments)    Upset stomach  . Trazodone And Nefazodone     Confusion and hallucinations  . Erythromycin Nausea And Vomiting  . Prochlorperazine Edisylate     nausea     Objective: Vitals:   12/04/18 1130  Temp: 98.1 F (36.7 C)    Physical Examination:  Vascular Examination: Capillary refill time immediate x 10 digits.  Palpable DP/PT pulses b/l.  Digital hair absent b/l.  No edema noted b/l.  Skin temperature gradient WNL b/l.  Dermatological Examination: Skin with normal turgor, texture and tone b/l.  No open wounds b/l.  No interdigital macerations noted b/l.  Elongated, thick, discolored brittle toenails b/l great toes with subungual debris and pain on dorsal palpation of nailbeds 1-5 b/l. Minimal improvement with laser therapy.  Musculoskeletal Examination: Muscle strength 5/5 to all muscle groups b/l.  No pain, crepitus or joint discomfort with active/passive ROM.  Neurological Examination: Sensation intact 5/5 b/l with 10 gram monofilament.  Vibratory sensation intact b/l.  Assessment: Mycotic nail infection with pain 1-5 b/l Chemotherapy induced neuropathy  Plan: 1. Bilateral great toenails 1-5 b/l were debrided in length and girth without iatrogenic laceration. Discussed need for topical antifungal. She agrees. Rx written for nonformulary compounding topical antifungal: [facility]:  Antifungal cream - Terbinafine 3%, Fluconazole 2%, Tea Tree Oil 5%, Urea 10%, Ibuprofen 2% in DMSO Suspension #29ml. Apply to the affected nail(s) at bedtime. She may schedule her last laser treatment if she'd like. 2.  Continue soft, supportive shoe gear daily. 3.  Report any pedal injuries to medical professional. 4.  Follow up 3 months. 5.  Patient/POA to call should there be a question/concern in there interim.

## 2019-01-01 ENCOUNTER — Telehealth: Payer: Self-pay | Admitting: *Deleted

## 2019-01-01 NOTE — Telephone Encounter (Addendum)
Just had cataract surgery of left eye and scheduled for right eye on 01/22/19. Has OV/Zometa infusion on 01/15/19. Asking if there is any need to hold off on Zometa till after she has the surgeries finished? Per Dr. Benay Spice: No contraindication for Zometa between procedures. She will discuss w/patient and call back if she decides to not come.

## 2019-01-07 ENCOUNTER — Telehealth: Payer: Self-pay | Admitting: *Deleted

## 2019-01-07 ENCOUNTER — Telehealth: Payer: Self-pay | Admitting: Oncology

## 2019-01-07 NOTE — Telephone Encounter (Signed)
R/s appt per 7/23 sch message - pt aware of new appt date and time

## 2019-01-07 NOTE — Telephone Encounter (Signed)
Called to request moving her appointments from July 31st to early September. Scheduling message sent.

## 2019-01-12 ENCOUNTER — Other Ambulatory Visit: Payer: Self-pay | Admitting: Internal Medicine

## 2019-01-12 DIAGNOSIS — Z1231 Encounter for screening mammogram for malignant neoplasm of breast: Secondary | ICD-10-CM

## 2019-01-13 ENCOUNTER — Other Ambulatory Visit: Payer: Self-pay | Admitting: Internal Medicine

## 2019-01-13 DIAGNOSIS — I10 Essential (primary) hypertension: Secondary | ICD-10-CM

## 2019-01-14 ENCOUNTER — Telehealth: Payer: Self-pay | Admitting: Internal Medicine

## 2019-01-14 DIAGNOSIS — G3184 Mild cognitive impairment, so stated: Secondary | ICD-10-CM

## 2019-01-14 DIAGNOSIS — E782 Mixed hyperlipidemia: Secondary | ICD-10-CM

## 2019-01-14 DIAGNOSIS — I10 Essential (primary) hypertension: Secondary | ICD-10-CM

## 2019-01-14 DIAGNOSIS — R7303 Prediabetes: Secondary | ICD-10-CM

## 2019-01-14 DIAGNOSIS — M8580 Other specified disorders of bone density and structure, unspecified site: Secondary | ICD-10-CM

## 2019-01-14 NOTE — Telephone Encounter (Signed)
Medications refilled, LVM for pt to call back to schedule follow-up.

## 2019-01-14 NOTE — Telephone Encounter (Signed)
Patient is scheduled for her physical on Wednesday, August 19th. She wanted to know if she should have labs done prior to coming in for the appointment. Please advise.

## 2019-01-15 ENCOUNTER — Ambulatory Visit: Payer: Medicare Other

## 2019-01-15 ENCOUNTER — Ambulatory Visit: Payer: Medicare Other | Admitting: Oncology

## 2019-01-15 ENCOUNTER — Other Ambulatory Visit: Payer: Medicare Other

## 2019-01-15 NOTE — Telephone Encounter (Signed)
Yes, that would be ideal so we can discuss at her visit.  Labs ordered.

## 2019-01-15 NOTE — Telephone Encounter (Signed)
LVM letting pt know labs were put in

## 2019-02-02 NOTE — Patient Instructions (Addendum)
Tests ordered today. Your results will be released to [place] (or called to you) after review.  If any changes need to be made, you will be notified at that same time.  All other Health Maintenance issues reviewed.   All recommended immunizations and age-appropriate screenings are up-to-date or discussed.  No immunization administered today.   Medications reviewed and updated.  Changes include :   none  Your prescription(s) have been submitted to your pharmacy. Please take as directed and contact our office if you believe you are having problem(s) with the medication(s).   Please followup in 6 months   Vaccines:  I would recommend getting the Flu vaccine and the Tetanus vaccine.  You should also consider getting the shingles vaccine, which should be at the same time as the flu vaccine ( they should ideally be a month apart)   Health Maintenance, Female Adopting a healthy lifestyle and getting preventive care are important in promoting health and wellness. Ask your health care provider about:  The right schedule for you to have regular tests and exams.  Things you can do on your own to prevent diseases and keep yourself healthy. What should I know about diet, weight, and exercise? Eat a healthy diet   Eat a diet that includes plenty of vegetables, fruits, low-fat dairy products, and lean protein.  Do not eat a lot of foods that are high in solid fats, added sugars, or sodium. Maintain a healthy weight Body mass index (BMI) is used to identify weight problems. It estimates body fat based on height and weight. Your health care provider can help determine your BMI and help you achieve or maintain a healthy weight. Get regular exercise Get regular exercise. This is one of the most important things you can do for your health. Most adults should:  Exercise for at least 150 minutes each week. The exercise should increase your heart rate and make you sweat (moderate-intensity exercise).   Do strengthening exercises at least twice a week. This is in addition to the moderate-intensity exercise.  Spend less time sitting. Even light physical activity can be beneficial. Watch cholesterol and blood lipids Have your blood tested for lipids and cholesterol at 69 years of age, then have this test every 5 years. Have your cholesterol levels checked more often if:  Your lipid or cholesterol levels are high.  You are older than 69 years of age.  You are at high risk for heart disease. What should I know about cancer screening? Depending on your health history and family history, you may need to have cancer screening at various ages. This may include screening for:  Breast cancer.  Cervical cancer.  Colorectal cancer.  Skin cancer.  Lung cancer. What should I know about heart disease, diabetes, and high blood pressure? Blood pressure and heart disease  High blood pressure causes heart disease and increases the risk of stroke. This is more likely to develop in people who have high blood pressure readings, are of African descent, or are overweight.  Have your blood pressure checked: ? Every 3-5 years if you are 13-13 years of age. ? Every year if you are 67 years old or older. Diabetes Have regular diabetes screenings. This checks your fasting blood sugar level. Have the screening done:  Once every three years after age 63 if you are at a normal weight and have a low risk for diabetes.  More often and at a younger age if you are overweight or have a high  risk for diabetes. What should I know about preventing infection? Hepatitis B If you have a higher risk for hepatitis B, you should be screened for this virus. Talk with your health care provider to find out if you are at risk for hepatitis B infection. Hepatitis C Testing is recommended for:  Everyone born from [birth year] through 1966.  Anyone with known risk factors for hepatitis C. Sexually transmitted infections  (STIs)  Get screened for STIs, including gonorrhea and chlamydia, if: ? You are sexually active and are younger than 69 years of age. ? You are older than 69 years of age and your health care provider tells you that you are at risk for this type of infection. ? Your sexual activity has changed since you were last screened, and you are at increased risk for chlamydia or gonorrhea. Ask your health care provider if you are at risk.  Ask your health care provider about whether you are at high risk for HIV. Your health care provider may recommend a prescription medicine to help prevent HIV infection. If you choose to take medicine to prevent HIV, you should first get tested for HIV. You should then be tested every 3 months for as long as you are taking the medicine. Pregnancy  If you are about to stop having your period (premenopausal) and you may become pregnant, seek counseling before you get pregnant.  Take 400 to 800 micrograms (mcg) of folic acid every day if you become pregnant.  Ask for birth control (contraception) if you want to prevent pregnancy. Osteoporosis and menopause Osteoporosis is a disease in which the bones lose minerals and strength with aging. This can result in bone fractures. If you are 64 years old or older, or if you are at risk for osteoporosis and fractures, ask your health care provider if you should:  Be screened for bone loss.  Take a calcium or vitamin D supplement to lower your risk of fractures.  Be given hormone replacement therapy (HRT) to treat symptoms of menopause. Follow these instructions at home: Lifestyle  Do not use any products that contain nicotine or tobacco, such as cigarettes, e-cigarettes, and chewing tobacco. If you need help quitting, ask your health care provider.  Do not use street drugs.  Do not share needles.  Ask your health care provider for help if you need support or information about quitting drugs. Alcohol use  Do not drink  alcohol if: ? Your health care provider tells you not to drink. ? You are pregnant, may be pregnant, or are planning to become pregnant.  If you drink alcohol: ? Limit how much you use to 0-1 drink a day. ? Limit intake if you are breastfeeding.  Be aware of how much alcohol is in your drink. In the [place] one drink equals one 12 oz bottle of beer (355 mL), one 5 oz glass of wine (148 mL), or one 1 oz glass of hard liquor (44 mL). General instructions  Schedule regular health, dental, and eye exams.  Stay current with your vaccines.  Tell your health care provider if: ? You often feel depressed. ? You have ever been abused or do not feel safe at home. Summary  Adopting a healthy lifestyle and getting preventive care are important in promoting health and wellness.  Follow your health care provider's instructions about healthy diet, exercising, and getting tested or screened for diseases.  Follow your health care provider's instructions on monitoring your cholesterol and blood pressure. This information is  not intended to replace advice given to you by your health care provider. Make sure you discuss any questions you have with your health care provider. Document Released: 12/17/2010 Document Revised: 05/27/2018 Document Reviewed: 05/27/2018 Elsevier Patient Education  2020 [employer].

## 2019-02-02 NOTE — Progress Notes (Signed)
Subjective:    Patient ID: Shirley Melendez, female    DOB: 09-07-1950, 68 y.o.   MRN: [id number]  HPI She is here for a physical exam.     Medications and allergies reviewed with patient and updated if appropriate.  Patient Active Problem List   Diagnosis Date Noted  . Mild cognitive impairment 04/02/2018  . High serum vitamin B12 04/02/2018  . Confusion 03/04/2018  . Hallucination 02/18/2018  . Neuropathy due to chemotherapeutic drug ([place]) 02/18/2018  . Sleep difficulties 02/18/2018  . Sciatica of left side 07/25/2017  . Prediabetes 12/11/2016  . Essential hypertension, benign 07/16/2013  . Lymphedema of upper extremity following lymphadenectomy 11/07/2011  . Nonspecific abnormal electrocardiogram (ECG) (EKG) 09/27/2011  . Cervicalgia 04/12/2009  . BACK PAIN, LUMBAR 04/12/2009  . Hyperlipidemia 08/24/2008  . Osteoarthritis 08/24/2008  . Osteopenia 08/24/2008  . hx: breast cancer, left, invasive ductal carcinoma with axillary mets, receptor + 08/24/2008    Current Outpatient Medications on File Prior to Visit  Medication Sig Dispense Refill  . anastrozole (ARIMIDEX) 1 MG tablet TAKE ONE TABLET BY MOUTH DAILY 90 tablet 1  . Calcium Carbonate-Vitamin D (CALCIUM 600 + D PO) Take 1 capsule by mouth 2 (two) times daily. 600/400    . DULoxetine (CYMBALTA) 60 MG capsule Take 1 capsule (60 mg total) by mouth daily. 90 capsule 1  . ibuprofen (ADVIL,MOTRIN) 200 MG tablet Take 200 mg by mouth every 6 (six) hours as needed.      Marland Kitchen LYSINE PO Take 500 mg by mouth as needed.      . memantine (NAMENDA) 10 MG tablet Take 1 tablet (10 mg total) by mouth 2 (two) times daily. 60 tablet 5  . metoprolol tartrate (LOPRESSOR) 50 MG tablet TAKE ONE TABLET BY MOUTH TWICE A DAY 180 tablet 0  . Multiple Vitamins-Minerals (SENIOR MULTIVITAMIN PLUS PO) Take by mouth daily.      . NON FORMULARY Antifungal cream from [place]    . spironolactone (ALDACTONE) 25 MG tablet TAKE ONE TABLET BY  MOUTH DAILY 90 tablet 0  . Vitamin E 200 UNITS TABS Take 400 Units by mouth daily.      No current facility-administered medications on file prior to visit.     Past Medical History:  Diagnosis Date  . Arthritis    chronic neck and back pain  . Breast cancer ([place]) 1997   surgery, chemo, & radiation  . Breast cancer ([place]) 2012   surgery, chemo, & radiation with axillary dissection  . CTS (carpal tunnel syndrome) 2013   Dr [name] , Neurology  . Fasting hyperglycemia 2012   FBS 110  . Hyperlipidemia   . Hypertension   . 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 Memory loss   . MRSA (methicillin resistant staph aureus) culture positive 2012   swab was positive, treated per protocol.  No other issues.  . Osteopenia   . Personal history of chemotherapy 1997  . Personal history of radiation therapy 1997   x2  . PONV (postoperative nausea and vomiting) 1997   no problems since    Past Surgical History:  Procedure Laterality Date  . AXILLARY NODE DISSECTION  08/2010   left  . AXILLARY NODE DISSECTION Left 2012   with axillary dissection on left  . BREAST BIOPSY  2012   x2  . BREAST LUMPECTOMY  09/1995, 10/1995   left - lumpectomy   . BREAST LUMPECTOMY  2012   Lumpectomy Axilla   . BREAST SURGERY  2012    axillary node dissection left  . CARPAL TUNNEL RELEASE  10/29/2011   Procedure: CARPAL TUNNEL RELEASE;  Surgeon: Cammie Sickle., MD;  Location: [facility];  Service: Orthopedics;  Laterality: Right;  . cataract surgery  Bilateral 2020  . COLONOSCOPY  2005   negative  . DILATION AND CURETTAGE, DIAGNOSTIC / THERAPEUTIC  2002  . KNEE SURGERY  2000   left  . TRIGGER FINGER RELEASE  10/29/2011   Procedure: RELEASE TRIGGER FINGER/A-1 PULLEY;  Surgeon: Cammie Sickle., MD;  Location: [facility];  Service: Orthopedics;  Laterality: Right;  right long and right index     Social History    Socioeconomic History  . Marital status: Married    Spouse name: Not on file  . Number of children: 1  . Years of education: college  . Highest education level: Master's degree (e.g., MA, MS, MEng, MEd, MSW, MBA)  Occupational History  . Occupation: Retired  Scientific laboratory technician  . Financial resource strain: Not hard at all  . Food insecurity    Worry: Never true    Inability: Never true  . Transportation needs    Medical: No    Non-medical: No  Tobacco Use  . Smoking status: Never Smoker  . Smokeless tobacco: Never Used  Substance and Sexual Activity  . Alcohol use: No    Alcohol/week: 0.0 standard drinks  . Drug use: No  . Sexual activity: Not on file  Lifestyle  . Physical activity    Days per week: 4 days    Minutes per session: 60 min  . Stress: Not at all  Relationships  . Social connections    Talks on phone: More than three times a week    Gets together: More than three times a week    Attends religious service: More than 4 times per year    Active member of club or organization: Yes    Attends meetings of clubs or organizations: More than 4 times per year    Relationship status: Married  Other Topics Concern  . Not on file  Social History Narrative   Retired Licensed conveyancer      Exercise: walking      Lives at home with her husband.   Right-handed.   2-3 cups coffee per day.    Family History  Problem Relation Age of Onset  . Hypertension Mother   . Arthritis Mother        OA  . Other Father        unknown medical history  . Arthritis Maternal Grandfather        OA  . Heart attack Maternal Grandfather 87  . Heart attack Maternal Uncle 65  . Stroke Neg Hx   . Diabetes Neg Hx   . Breast cancer Neg Hx     Review of Systems  Constitutional: Positive for fatigue. Negative for chills and fever.  Eyes: Negative for visual disturbance.  Respiratory: Negative for cough, shortness of breath and wheezing.   Cardiovascular: Negative for chest pain, palpitations  and leg swelling.  Gastrointestinal: Negative for abdominal pain, blood in stool, constipation, diarrhea and nausea.       No gerd  Genitourinary: Negative for dysuria and hematuria.  Musculoskeletal: Positive for back pain (limits her activity, takes advil). Negative for arthralgias.  Skin: Negative for color change and rash.  Neurological: Negative for light-headedness and  headaches.  Psychiatric/Behavioral: Negative for dysphoric mood. The patient is nervous/anxious (mild).        Objective:   Vitals:   02/03/19 1440  BP: 110/70  Pulse: 89  Temp: 98.5 F (36.9 C)  SpO2: 96%   Filed Weights   02/03/19 1440  Weight: 111 lb (50.3 kg)   Body mass index is 21.68 kg/m.  BP Readings from Last 3 Encounters:  02/03/19 110/70  07/16/18 114/75  04/02/18 126/79    Wt Readings from Last 3 Encounters:  02/03/19 111 lb (50.3 kg)  07/16/18 111 lb 14.4 oz (50.8 kg)  04/02/18 108 lb (49 kg)     Physical Exam Constitutional: She appears well-developed and well-nourished. No distress.  HENT:  Head: Normocephalic and atraumatic.  Right Ear: External ear normal. Normal ear canal and TM Left Ear: External ear normal.  Normal ear canal and TM Mouth/Throat: Oropharynx is clear and moist.  Eyes: Conjunctivae and EOM are normal.  Neck: Neck supple. No tracheal deviation present. No thyromegaly present.  No carotid bruit  Cardiovascular: Normal rate, regular rhythm and normal heart sounds.   No murmur heard.  No edema. Pulmonary/Chest: Effort normal and breath sounds normal. No respiratory distress. She has no wheezes. She has no rales.  Breast: deferred   Abdominal: Soft. She exhibits no distension. There is no tenderness.  Lymphadenopathy: She has no cervical adenopathy.  Skin: Skin is warm and dry. She is not diaphoretic.  Psychiatric: She has a normal mood and affect. Her behavior is normal.        Assessment & Plan:   Physical exam: Screening blood work ordered  Immunizations   Discussed shingrix,  Discussed tdap, recommended flu vaccine Colonoscopy   Up to date  Mammogram  Scheduled for 02/26/19 Gyn   - no longer seeing Dexa   Due -- ordered Eye exams    Up to date  Exercise  Walking 1-2 miles  Weight  Normal BMI Skin  No concerns Substance abuse   none  See Problem List for Assessment and Plan of chronic medical problems.    FU in 6 months

## 2019-02-03 ENCOUNTER — Other Ambulatory Visit: Payer: Self-pay

## 2019-02-03 ENCOUNTER — Encounter: Payer: Self-pay | Admitting: Internal Medicine

## 2019-02-03 ENCOUNTER — Ambulatory Visit (INDEPENDENT_AMBULATORY_CARE_PROVIDER_SITE_OTHER): Payer: Medicare Other | Admitting: Internal Medicine

## 2019-02-03 ENCOUNTER — Inpatient Hospital Stay: Admission: RE | Admit: 2019-02-03 | Payer: Medicare Other | Source: Ambulatory Visit

## 2019-02-03 VITALS — BP 110/70 | HR 89 | Temp 98.5°F | Ht 60.0 in | Wt 111.0 lb

## 2019-02-03 DIAGNOSIS — G3184 Mild cognitive impairment, so stated: Secondary | ICD-10-CM

## 2019-02-03 DIAGNOSIS — M85851 Other specified disorders of bone density and structure, right thigh: Secondary | ICD-10-CM

## 2019-02-03 DIAGNOSIS — M545 Low back pain: Secondary | ICD-10-CM

## 2019-02-03 DIAGNOSIS — E782 Mixed hyperlipidemia: Secondary | ICD-10-CM | POA: Diagnosis not present

## 2019-02-03 DIAGNOSIS — I10 Essential (primary) hypertension: Secondary | ICD-10-CM

## 2019-02-03 DIAGNOSIS — R7303 Prediabetes: Secondary | ICD-10-CM

## 2019-02-03 DIAGNOSIS — T451X5A Adverse effect of antineoplastic and immunosuppressive drugs, initial encounter: Secondary | ICD-10-CM

## 2019-02-03 DIAGNOSIS — G620 Drug-induced polyneuropathy: Secondary | ICD-10-CM

## 2019-02-03 DIAGNOSIS — Z0000 Encounter for general adult medical examination without abnormal findings: Secondary | ICD-10-CM

## 2019-02-03 DIAGNOSIS — M85852 Other specified disorders of bone density and structure, left thigh: Secondary | ICD-10-CM

## 2019-02-03 DIAGNOSIS — G8929 Other chronic pain: Secondary | ICD-10-CM

## 2019-02-03 MED ORDER — DULOXETINE HCL 60 MG PO CPEP: 60.0000 mg | ORAL_CAPSULE | Freq: Every day | ORAL | 1 refills | Status: DC

## 2019-02-03 NOTE — Assessment & Plan Note (Signed)
Has nerve pain under left axilla Controlled w/ cymbalta 60 mg daily Will continue - refill today

## 2019-02-03 NOTE — Assessment & Plan Note (Signed)
Monitored and treated by oncology Due for dexa - had here at [facility] so I will order Continue walking Continue calcium and vitamin

## 2019-02-03 NOTE — Assessment & Plan Note (Signed)
Check lipid panel  Lifestyle controlled Regular exercise and healthy diet encouraged

## 2019-02-03 NOTE — Assessment & Plan Note (Signed)
BP well controlled Current regimen effective and well tolerated Continue current medications at current doses cmp  

## 2019-02-03 NOTE — Assessment & Plan Note (Signed)
Following with neurology [place]

## 2019-02-03 NOTE — Assessment & Plan Note (Signed)
Chronic Takes advil prn

## 2019-02-03 NOTE — Assessment & Plan Note (Signed)
Check a1c Low sugar / carb diet Stressed regular exercise   

## 2019-02-09 ENCOUNTER — Other Ambulatory Visit: Payer: Self-pay

## 2019-02-09 ENCOUNTER — Ambulatory Visit (INDEPENDENT_AMBULATORY_CARE_PROVIDER_SITE_OTHER)
Admission: RE | Admit: 2019-02-09 | Discharge: 2019-02-09 | Disposition: A | Discharge status: Home / Self Care | Payer: Medicare Other | Source: Ambulatory Visit | Attending: Internal Medicine | Admitting: Internal Medicine

## 2019-02-09 ENCOUNTER — Other Ambulatory Visit (INDEPENDENT_AMBULATORY_CARE_PROVIDER_SITE_OTHER): Payer: Medicare Other

## 2019-02-09 DIAGNOSIS — E782 Mixed hyperlipidemia: Secondary | ICD-10-CM

## 2019-02-09 DIAGNOSIS — Z0000 Encounter for general adult medical examination without abnormal findings: Secondary | ICD-10-CM | POA: Diagnosis not present

## 2019-02-09 DIAGNOSIS — R7303 Prediabetes: Secondary | ICD-10-CM

## 2019-02-09 DIAGNOSIS — M85852 Other specified disorders of bone density and structure, left thigh: Secondary | ICD-10-CM

## 2019-02-09 DIAGNOSIS — M85851 Other specified disorders of bone density and structure, right thigh: Secondary | ICD-10-CM

## 2019-02-09 DIAGNOSIS — I10 Essential (primary) hypertension: Secondary | ICD-10-CM | POA: Diagnosis not present

## 2019-02-09 LAB — LIPID PANEL
Cholesterol: 227 mg/dL — ABNORMAL HIGH (ref 0–200)
HDL: 85.9 mg/dL (ref >39.00)
LDL Cholesterol: 113 mg/dL — ABNORMAL HIGH (ref 0–99)
NonHDL: 141.43
Total CHOL/HDL Ratio: 3
Triglycerides: 143 mg/dL (ref 0.0–149.0)
VLDL: 28.6 mg/dL (ref 0.0–40.0)

## 2019-02-09 LAB — COMPREHENSIVE METABOLIC PANEL
ALT: 19 U/L (ref 0–35)
AST: 15 U/L (ref 0–37)
Albumin: 4.3 g/dL (ref 3.5–5.2)
Alkaline Phosphatase: 62 U/L (ref 39–117)
BUN: 15 mg/dL (ref 6–23)
CO2: 28 mEq/L (ref 19–32)
Calcium: 9.3 mg/dL (ref 8.4–10.5)
Chloride: 99 mEq/L (ref 96–112)
Creatinine, Ser: 0.94 mg/dL (ref 0.40–1.20)
GFR: 58.98 mL/min — ABNORMAL LOW (ref >60.00)
Glucose, Bld: 108 mg/dL — ABNORMAL HIGH (ref 70–99)
Potassium: 4.3 mEq/L (ref 3.5–5.1)
Sodium: 134 mEq/L — ABNORMAL LOW (ref 135–145)
Total Bilirubin: 0.7 mg/dL (ref 0.2–1.2)
Total Protein: 6.5 g/dL (ref 6.0–8.3)

## 2019-02-09 LAB — CBC WITH DIFFERENTIAL/PLATELET
Basophils Absolute: 0 10*3/uL (ref 0.0–0.1)
Basophils Relative: 0.3 % (ref 0.0–3.0)
Eosinophils Absolute: 0 10*3/uL (ref 0.0–0.7)
Eosinophils Relative: 0.3 % (ref 0.0–5.0)
HCT: 41.7 % (ref 36.0–46.0)
Hemoglobin: 14.4 g/dL (ref 12.0–15.0)
Lymphocytes Relative: 9.8 % — ABNORMAL LOW (ref 12.0–46.0)
Lymphs Abs: 1 10*3/uL (ref 0.7–4.0)
MCHC: 34.5 g/dL (ref 30.0–36.0)
MCV: 92.9 fl (ref 78.0–100.0)
Monocytes Absolute: 0.8 10*3/uL (ref 0.1–1.0)
Monocytes Relative: 7.7 % (ref 3.0–12.0)
Neutro Abs: 8.8 10*3/uL — ABNORMAL HIGH (ref 1.4–7.7)
Neutrophils Relative %: 81.9 % — ABNORMAL HIGH (ref 43.0–77.0)
Platelets: 302 10*3/uL (ref 150.0–400.0)
RBC: 4.48 Mil/uL (ref 3.87–5.11)
RDW: 13.3 % (ref 11.5–15.5)
WBC: 10.7 10*3/uL — ABNORMAL HIGH (ref 4.0–10.5)

## 2019-02-09 LAB — TSH: TSH: 1.39 u[IU]/mL (ref 0.35–4.50)

## 2019-02-09 LAB — VITAMIN D 25 HYDROXY (VIT D DEFICIENCY, FRACTURES): VITD: 53.44 ng/mL (ref 30.00–100.00)

## 2019-02-09 LAB — HEMOGLOBIN A1C: Hgb A1c MFr Bld: 5.9 % (ref 4.6–6.5)

## 2019-02-11 ENCOUNTER — Encounter: Payer: Self-pay | Admitting: Internal Medicine

## 2019-02-19 ENCOUNTER — Inpatient Hospital Stay: Payer: Medicare Other

## 2019-02-19 ENCOUNTER — Telehealth: Payer: Self-pay | Admitting: Oncology

## 2019-02-19 ENCOUNTER — Inpatient Hospital Stay: Payer: Medicare Other | Attending: Oncology

## 2019-02-19 ENCOUNTER — Other Ambulatory Visit: Payer: Self-pay

## 2019-02-19 ENCOUNTER — Inpatient Hospital Stay (HOSPITAL_BASED_OUTPATIENT_CLINIC_OR_DEPARTMENT_OTHER): Payer: Medicare Other | Admitting: Oncology

## 2019-02-19 VITALS — BP 120/72 | HR 79 | Temp 98.7°F | Resp 18 | Ht 61.0 in | Wt 109.1 lb

## 2019-02-19 DIAGNOSIS — M810 Age-related osteoporosis without current pathological fracture: Secondary | ICD-10-CM | POA: Diagnosis not present

## 2019-02-19 DIAGNOSIS — Z853 Personal history of malignant neoplasm of breast: Secondary | ICD-10-CM

## 2019-02-19 DIAGNOSIS — Z79899 Other long term (current) drug therapy: Secondary | ICD-10-CM | POA: Diagnosis not present

## 2019-02-19 DIAGNOSIS — Z923 Personal history of irradiation: Secondary | ICD-10-CM | POA: Insufficient documentation

## 2019-02-19 DIAGNOSIS — R413 Other amnesia: Secondary | ICD-10-CM | POA: Diagnosis not present

## 2019-02-19 DIAGNOSIS — C773 Secondary and unspecified malignant neoplasm of axilla and upper limb lymph nodes: Secondary | ICD-10-CM | POA: Insufficient documentation

## 2019-02-19 DIAGNOSIS — Z170 Estrogen receptor positive status [ER+]: Secondary | ICD-10-CM | POA: Insufficient documentation

## 2019-02-19 DIAGNOSIS — C50912 Malignant neoplasm of unspecified site of left female breast: Secondary | ICD-10-CM | POA: Diagnosis not present

## 2019-02-19 DIAGNOSIS — Z9221 Personal history of antineoplastic chemotherapy: Secondary | ICD-10-CM | POA: Insufficient documentation

## 2019-02-19 DIAGNOSIS — Z79811 Long term (current) use of aromatase inhibitors: Secondary | ICD-10-CM | POA: Insufficient documentation

## 2019-02-19 DIAGNOSIS — I890 Lymphedema, not elsewhere classified: Secondary | ICD-10-CM | POA: Insufficient documentation

## 2019-02-19 DIAGNOSIS — I10 Essential (primary) hypertension: Secondary | ICD-10-CM | POA: Diagnosis not present

## 2019-02-19 DIAGNOSIS — M8580 Other specified disorders of bone density and structure, unspecified site: Secondary | ICD-10-CM

## 2019-02-19 LAB — BASIC METABOLIC PANEL - CANCER CENTER ONLY
Anion gap: 9 (ref 5–15)
BUN: 14 mg/dL (ref 8–23)
CO2: 25 mmol/L (ref 22–32)
Calcium: 9.7 mg/dL (ref 8.9–10.3)
Chloride: 101 mmol/L (ref 98–111)
Creatinine: 0.96 mg/dL (ref 0.44–1.00)
GFR, Est AFR Am: >60 mL/min (ref >60)
GFR, Estimated: >60 mL/min (ref >60)
Glucose, Bld: 117 mg/dL — ABNORMAL HIGH (ref 70–99)
Potassium: 4 mmol/L (ref 3.5–5.1)
Sodium: 135 mmol/L (ref 135–145)

## 2019-02-19 MED ORDER — SODIUM CHLORIDE 0.9 % IV SOLN
Freq: Once | INTRAVENOUS | Status: AC
  Administered 2019-02-19: 12:00:00 via INTRAVENOUS
  Filled 2019-02-19: qty 250

## 2019-02-19 MED ORDER — ZOLEDRONIC ACID 4 MG/100ML IV SOLN
4.0000 mg | Freq: Once | INTRAVENOUS | Status: AC
  Administered 2019-02-19: 4 mg via INTRAVENOUS
  Filled 2019-02-19: qty 100

## 2019-02-19 NOTE — Telephone Encounter (Signed)
Called and spoke with patient. Confirmed appts  °

## 2019-02-19 NOTE — Progress Notes (Signed)
  [facility] OFFICE PROGRESS NOTE   Diagnosis: Breast cancer   INTERVAL HISTORY:   Ms. Crayton returns as scheduled.  She feels well.  She is followed by neurology for memory loss.  She is scheduled for a mammogram next week. A bone density scan on 02/09/2019 confirmed osteopenia with improvement in the bone density compared to the previous study. Objective:  Vital signs in last 24 hours:  Blood pressure 120/72, pulse 79, temperature 98.7 F (37.1 C), temperature source Temporal, resp. rate 18, height '5\' 1"'$  (1.549 m), weight 109 lb 1.6 oz (49.5 kg), SpO2 100 %.   Limited physical examination secondary to distancing with the COVID pandemic HEENT: Neck without mass Lymphatics: No cervical, supraclavicular, or left axillary nodes.  Pea-sized mobile right axillary node GI: No hepatomegaly, nontender Vascular: No leg edema Breast: Status post left lumpectomy.  No evidence for local tumor recurrence.  No mass in either breast.  Lab Results:  Lab Results  Component Value Date   WBC 10.7 (H) 02/09/2019   HGB 14.4 02/09/2019   HCT 41.7 02/09/2019   MCV 92.9 02/09/2019   PLT 302.0 02/09/2019   NEUTROABS 8.8 (H) 02/09/2019    CMP  Lab Results  Component Value Date   NA 135 02/19/2019   K 4.0 02/19/2019   CL 101 02/19/2019   CO2 25 02/19/2019   GLUCOSE 117 (H) 02/19/2019   BUN 14 02/19/2019   CREATININE 0.96 02/19/2019   CALCIUM 9.7 02/19/2019   PROT 6.5 02/09/2019   ALBUMIN 4.3 02/09/2019   AST 15 02/09/2019   ALT 19 02/09/2019   ALKPHOS 62 02/09/2019   BILITOT 0.7 02/09/2019   GFRNONAA >60 02/19/2019   GFRAA >60 02/19/2019     Medications: I have reviewed the patient's current medications.   Assessment/Plan: 1. Stage I (T1 NX) left-sided breast cancer diagnosed in April 1997. She was treated with a lumpectomy, left breast radiation, CMF chemotherapy, and 5 years of tamoxifen. 2. Metastatic carcinoma involving a left axillary lymph node, ER-positive,  PR-positive, and HER-2-negative. She is status post a biopsy of a left axillary lymph node 08/13/2010. Status post a left axillary lymph node dissection 09/13/2010 with the pathology confirming 3/11 axillary lymph nodes containing metastatic carcinoma. A staging PET scan on 08/23/2010 confirmed a hypermetabolic left axillary lymph node and no other evidence of metastatic disease.  1. Status post 4 cycles of "adjuvant" T-C chemotherapy with cycle #4 given on 01/07/2011. 2. She began Arimidex on 03/26/2011 3. Left arm lymphedema, likely lymphedema related to the left axillary lymph node dissection. She is now followed at the lymphedema clinic. Improved with physical therapy and a lymphedema sleeve. 4. Pain and numbness at the fingers bilaterally-right greater than left, she reports being diagnosed with carpal tunnel syndrome and underwent a right sided carpal tunnel surgery with clinical improvement  5. Osteopenia-she received Zometa in July of 2018.bone density scanMay 2018confirmed osteopenia at the femoral neck 6. Hypertension 7.  Altered mental status 2019-undergoing evaluation by neurology 8.  Elevated vitamin B12 level 04/02/2018- nonspecific     Disposition: Ms. Thackston is in remission from breast cancer.  She will continue Arimidex.  She will receive Zometa today.  She will return for office visit and Zometa in 6 months.  She has intermittent mild neutrophilia and an elevated vitamin B12 level.  This is likely nonspecific.  No other sign of a myeloproliferative disorder. Betsy Coder, MD  02/19/2019  11:02 AM

## 2019-02-19 NOTE — Patient Instructions (Signed)
Zoledronic Acid injection (Hypercalcemia, Oncology) What is this medicine? ZOLEDRONIC ACID (ZOE le dron ik AS id) lowers the amount of calcium loss from bone. It is used to treat too much calcium in your blood from cancer. It is also used to prevent complications of cancer that has spread to the bone. This medicine may be used for other purposes; ask your health care provider or pharmacist if you have questions. COMMON BRAND NAME(S): Zometa What should I tell my health care provider before I take this medicine? They need to know if you have any of these conditions:  aspirin-sensitive asthma  cancer, especially if you are receiving medicines used to treat cancer  dental disease or wear dentures  infection  kidney disease  receiving corticosteroids like dexamethasone or prednisone  an unusual or allergic reaction to zoledronic acid, other medicines, foods, dyes, or preservatives  pregnant or trying to get pregnant  breast-feeding How should I use this medicine? This medicine is for infusion into a vein. It is given by a health care professional in a hospital or clinic setting. Talk to your pediatrician regarding the use of this medicine in children. Special care may be needed. Overdosage: If you think you have taken too much of this medicine contact a poison control center or emergency room at once. NOTE: This medicine is only for you. Do not share this medicine with others. What if I miss a dose? It is important not to miss your dose. Call your doctor or health care professional if you are unable to keep an appointment. What may interact with this medicine?  certain antibiotics given by injection  NSAIDs, medicines for pain and inflammation, like ibuprofen or naproxen  some diuretics like bumetanide, furosemide  teriparatide  thalidomide This list may not describe all possible interactions. Give your health care provider a list of all the medicines, herbs, non-prescription  drugs, or dietary supplements you use. Also tell them if you smoke, drink alcohol, or use illegal drugs. Some items may interact with your medicine. What should I watch for while using this medicine? Visit your doctor or health care professional for regular checkups. It may be some time before you see the benefit from this medicine. Do not stop taking your medicine unless your doctor tells you to. Your doctor may order blood tests or other tests to see how you are doing. Women should inform their doctor if they wish to become pregnant or think they might be pregnant. There is a potential for serious side effects to an unborn child. Talk to your health care professional or pharmacist for more information. You should make sure that you get enough calcium and vitamin D while you are taking this medicine. Discuss the foods you eat and the vitamins you take with your health care professional. Some people who take this medicine have severe bone, joint, and/or muscle pain. This medicine may also increase your risk for jaw problems or a broken thigh bone. Tell your doctor right away if you have severe pain in your jaw, bones, joints, or muscles. Tell your doctor if you have any pain that does not go away or that gets worse. Tell your dentist and dental surgeon that you are taking this medicine. You should not have major dental surgery while on this medicine. See your dentist to have a dental exam and fix any dental problems before starting this medicine. Take good care of your teeth while on this medicine. Make sure you see your dentist for regular follow-up   appointments. What side effects may I notice from receiving this medicine? Side effects that you should report to your doctor or health care professional as soon as possible:  allergic reactions like skin rash, itching or hives, swelling of the face, lips, or tongue  anxiety, confusion, or depression  breathing problems  changes in vision  eye  pain  feeling faint or lightheaded, falls  jaw pain, especially after dental work  mouth sores  muscle cramps, stiffness, or weakness  redness, blistering, peeling or loosening of the skin, including inside the mouth  trouble passing urine or change in the amount of urine Side effects that usually do not require medical attention (report to your doctor or health care professional if they continue or are bothersome):  bone, joint, or muscle pain  constipation  diarrhea  fever  hair loss  irritation at site where injected  loss of appetite  nausea, vomiting  stomach upset  trouble sleeping  trouble swallowing  weak or tired This list may not describe all possible side effects. Call your doctor for medical advice about side effects. You may report side effects to FDA at 1-800-FDA-1088. Where should I keep my medicine? This drug is given in a hospital or clinic and will not be stored at home. NOTE: This sheet is a summary. It may not cover all possible information. If you have questions about this medicine, talk to your doctor, pharmacist, or health care provider.  2020 Elsevier/Gold Standard (2013-10-30 14:19:39)  

## 2019-02-26 ENCOUNTER — Ambulatory Visit
Admission: RE | Admit: 2019-02-26 | Discharge: 2019-02-26 | Disposition: A | Discharge status: Home / Self Care | Payer: Medicare Other | Source: Ambulatory Visit | Attending: Internal Medicine | Admitting: Internal Medicine

## 2019-02-26 ENCOUNTER — Other Ambulatory Visit: Payer: Self-pay

## 2019-02-26 DIAGNOSIS — Z1231 Encounter for screening mammogram for malignant neoplasm of breast: Secondary | ICD-10-CM

## 2019-03-06 ENCOUNTER — Ambulatory Visit: Payer: Medicare Other

## 2019-03-08 ENCOUNTER — Ambulatory Visit: Payer: Medicare Other | Admitting: Podiatry

## 2019-04-13 ENCOUNTER — Other Ambulatory Visit: Payer: Self-pay | Admitting: Internal Medicine

## 2019-04-13 ENCOUNTER — Other Ambulatory Visit: Payer: Self-pay | Admitting: Oncology

## 2019-04-13 DIAGNOSIS — I10 Essential (primary) hypertension: Secondary | ICD-10-CM

## 2019-04-13 DIAGNOSIS — Z853 Personal history of malignant neoplasm of breast: Secondary | ICD-10-CM

## 2019-05-17 ENCOUNTER — Telehealth: Payer: Self-pay | Admitting: Neurology

## 2019-05-17 NOTE — Telephone Encounter (Signed)
Patient daughter called back due to missed call and informed that if she is unable to reach the phone a VM can be left since she is also working with patients she may not be able to answer.  Please follow up.

## 2019-05-17 NOTE — Telephone Encounter (Signed)
I was able to speak to her daughter on [place].  She verbalized understanding of Dr. Rhea Belton response.  She would like for her mother to be seen in February.  A follow up has been scheduled on 08/02/2019.

## 2019-05-17 NOTE — Telephone Encounter (Signed)
Left message requesting her daughter (on [place]) to return my call.

## 2019-05-17 NOTE — Telephone Encounter (Signed)
Hello Dr. [name],  I saw [name]) over Thanksgiving and am noticing more sundowning - periodic spells of not recognizing her home, or me or dad. These often last briefly and upset her very much. This is a random question, but I have been reading research papers about hyperbaric oxygen therapy for mild cognitive impairments. Do you have any thoughts about this tx, and if it is offered through your practice? Would Medicare even cover it?  I realize this is a progressive disease and we are doing our best to slow progression. If you have any other recommendations besides the Namenda, please let me know. I also think it would be good to have another face to face visit at some point in 2021, when I can be present.  Thank you and hope this finds you well - Shirley Melendez [phone])  P.S. She has yet to be seen by a neuropsychologist - I think because of the long wait list/lack of availability in [place]?  Please call patient's daughter, it is ok to give her a follow up visit with me.  They want to hold off neuropsychiatric evaluation at last visit. If they want to proceed, we can put in refer.  Our office does not provide hyperbaric oxygen therapy, I do not think Medicare will cover it.

## 2019-05-26 ENCOUNTER — Encounter: Payer: Self-pay | Admitting: Podiatry

## 2019-05-26 ENCOUNTER — Other Ambulatory Visit: Payer: Self-pay

## 2019-05-26 ENCOUNTER — Ambulatory Visit: Payer: Medicare Other | Admitting: Podiatry

## 2019-05-26 DIAGNOSIS — M79675 Pain in left toe(s): Secondary | ICD-10-CM

## 2019-05-26 DIAGNOSIS — B351 Tinea unguium: Secondary | ICD-10-CM | POA: Diagnosis not present

## 2019-05-26 DIAGNOSIS — M79674 Pain in right toe(s): Secondary | ICD-10-CM

## 2019-06-03 NOTE — Progress Notes (Signed)
Subjective: Shirley Melendez is seen today for follow up painful, elongated, thickened toenails bilateral feet that she cannot cut. Pain interferes with daily activities. Aggravating factor includes wearing enclosed shoe gear and relieved with periodic debridement.  Medications reviewed in chart.  Allergies  Allergen Reactions  . Compazine Other (See Comments)    Muscle spasms in neck & back with jaw tightness  . Donepezil Other (See Comments)    Upset stomach  . Trazodone And Nefazodone     Confusion and hallucinations  . Erythromycin Nausea And Vomiting  . Prochlorperazine Edisylate     nausea     Objective:  Vascular Examination: Capillary refill time immediate b/l.  Dorsalis pedis present b/l.  Posterior tibial pulses present b/l.  Digital hair absent b/l.  Skin temperature gradient WNL b/l.   Dermatological Examination: Skin with normal turgor, texture and tone b/l.  Toenails 1-5 b/l discolored, thick, dystrophic with subungual debris and pain with palpation to nailbeds due to thickness of nails.  Musculoskeletal: Muscle strength 5/5 to all LE muscle groups b/l.  No gross bony deformities b/l.  No pain, crepitus or joint limitation noted with ROM.   Neurological Examination: Protective sensation intact 5/5 with 10 gram monofilament bilaterally.  Assessment: Painful onychomycosis toenails 1-5 b/l  Chemotherapy induced neuropathy  Plan: 1. She will schedule her last laser session at her convenience.  2. Toenails 1-5 b/l were debrided in length and girth without iatrogenic bleeding. 3. Patient to continue soft, supportive shoe gear daily. 4. Patient to report any pedal injuries to medical professional immediately. 5. Follow up 3 months.  6. Patient/POA to call should there be a concern in the interim.

## 2019-07-12 ENCOUNTER — Other Ambulatory Visit: Payer: Self-pay | Admitting: Internal Medicine

## 2019-07-12 DIAGNOSIS — I10 Essential (primary) hypertension: Secondary | ICD-10-CM

## 2019-07-22 ENCOUNTER — Other Ambulatory Visit: Payer: Self-pay | Admitting: Neurology

## 2019-07-25 ENCOUNTER — Other Ambulatory Visit: Payer: Self-pay | Admitting: Internal Medicine

## 2019-07-25 DIAGNOSIS — I10 Essential (primary) hypertension: Secondary | ICD-10-CM

## 2019-07-30 ENCOUNTER — Other Ambulatory Visit: Payer: Self-pay | Admitting: Internal Medicine

## 2019-08-02 ENCOUNTER — Other Ambulatory Visit: Payer: Self-pay

## 2019-08-02 ENCOUNTER — Encounter: Payer: Self-pay | Admitting: Neurology

## 2019-08-02 ENCOUNTER — Ambulatory Visit: Payer: Medicare PPO | Admitting: Neurology

## 2019-08-02 VITALS — BP 125/75 | HR 90 | Temp 97.5°F | Ht 61.0 in | Wt 112.6 lb

## 2019-08-02 DIAGNOSIS — G3184 Mild cognitive impairment, so stated: Secondary | ICD-10-CM | POA: Diagnosis not present

## 2019-08-02 DIAGNOSIS — R410 Disorientation, unspecified: Secondary | ICD-10-CM | POA: Diagnosis not present

## 2019-08-02 MED ORDER — MEMANTINE HCL 10 MG PO TABS: 10.0000 mg | ORAL_TABLET | Freq: Two times a day (BID) | ORAL | 4 refills | Status: DC

## 2019-08-02 NOTE — Progress Notes (Signed)
PATIENT: Shirley Melendez DOB: 1950-12-24  Chief Complaint  Patient presents with  . Follow-up    Rm 4, daughter  . Memory Loss     HISTORICAL  Shirley Melendez is a 69 year old female, seen in request by [name], [name] for evaluation of memory loss, she is accompanied by her husband Shirley Melendez at today's clinical visit, initial evaluation was on March 04, 2018.  I have reviewed and summarized the referring note from the referring physician, she had a history of left breast cancer, [name], stage I left-sided breast cancer in April 1997, treated with lobectomy radiation, CMF chemotherapy, 5 years of tamoxifen, metastatic carcinoma involving left axillary lymph node, had left axillary lymph node dissection on September 13, 2010, status post 4 cycle adjuvant TC chemotherapy, completed on January 07, 2011, begin Arimidex in December 2012, left arm lymphedema,  She is a retired Art therapist, enjoys reading, had a sudden onset memory change since May 2019, she has chronic insomnia, was given a prescription of trazodone in May 2019 to June 2019, which did not help her sleep much, but during that period of time, she began to have spells of hallucinations, seeing people at her house, woke up in the middle of the night confused, staring at her husband could not recognize him, spell lasted for few minutes, intermittent,  Since trazodone was stopped, she remains symptomatic, sometimes while eating breakfast, she is confused where she is, could not recognize her husband  UPDATE Apr 02 2018: She is accompanied by her husband and daughter Shirley Melendez at today's visit, we personally reviewed MRI of the brain with without contrast on March 21, 2018, there is evidence of generalized atrophy, progressed compared to previous CT scan in 2010, mild supratentorium small vessel disease.  She has stopped trazodone since August 2019, she continue have episode of confusion, she  gave me an example, one afternoon she came back from outside to her house, saw a man she seems to know before trying explain to her that she married to him for 41 years, there are three different mans, husband explained that it was him trying to explains to her what is going on, she was confused.  She denies significant memory loss, but has intermittent confusion spells,  Laboratory evaluations showed elevated B12 1698, mild elevated creatinine 1.02, elevated total cholesterol 227, LDL 128, normal CBC, TSH, A1c was 5.8,  Virtual Visit via telephone on October 07 2018  She is with her husband during the interview, reviewed her medication list, she is only taking arimidex 1mg  daily, cal-Vit D, cymbalta 60mg  daily, lysine 500mg  prn, metoprolol 50mg  daily, MVI, spironolactone 25mg  daily. Vit E.  She has tried aricept, complains of dizziness and GI side effect, has vivid nightmares, falling off her bed during sleep, never started on namenda, was not able to complete neuropsychiatric evaluations,   She is doing better, she eats well, sleeps well now, she still cooks, work on plant in her garden, likes to read, fiction, historical stories. She is not exercise regularly.  She does not have hallucinations, occasionally floaters.   Laboratory evaluations in Jan 2020, Vit B12 1170, BMP, Creat 1.16, normal CBC  UPDATE Aug 02 2019: She is accompanied by her daughter at today's clinical visit, she continues to decline slowly, there was intermittent episode of confusion, to the point that she could not recognize her husband, repeating questions, she is often frustrated about it.  She has good appetite,  she denies difficulty moving, was relatively isolated during winter months and COVID-19.  REVIEW OF SYSTEMS: Full 14 system review of systems performed and notable only for as above All other review of systems were negative.  ALLERGIES: Allergies  Allergen Reactions  . Compazine Other (See Comments)    Muscle  spasms in neck & back with jaw tightness  . Donepezil Other (See Comments)    Upset stomach  . Trazodone And Nefazodone     Confusion and hallucinations  . Erythromycin Nausea And Vomiting  . Prochlorperazine Edisylate     nausea    HOME MEDICATIONS: Current Outpatient Medications  Medication Sig Dispense Refill  . anastrozole (ARIMIDEX) 1 MG tablet TAKE ONE TABLET BY MOUTH DAILY 90 tablet 0  . Calcium Carbonate-Vitamin D (CALCIUM 600 + D PO) Take 1 capsule by mouth 2 (two) times daily. 600/400    . DULoxetine (CYMBALTA) 60 MG capsule TAKE ONE CAPSULE BY MOUTH DAILY 90 capsule 0  . ibuprofen (ADVIL,MOTRIN) 200 MG tablet Take 200 mg by mouth every 6 (six) hours as needed.      Marland Kitchen LYSINE PO Take 500 mg by mouth as needed.      . memantine (NAMENDA) 10 MG tablet TAKE 1 TABLET BY MOUTH TWO TIMES A DAY 60 tablet 11  . metoprolol tartrate (LOPRESSOR) 50 MG tablet TAKE ONE TABLET BY MOUTH TWICE A DAY 180 tablet 1  . Multiple Vitamins-Minerals (SENIOR MULTIVITAMIN PLUS PO) Take by mouth daily.      . NON FORMULARY Antifungal cream from [place]    . PROLENSA 0.07 % SOLN Place 1 drop into the left eye daily.    Marland Kitchen spironolactone (ALDACTONE) 25 MG tablet TAKE ONE TABLET BY MOUTH DAILY 90 tablet 0  . Vitamin E 200 UNITS TABS Take 400 Units by mouth daily.      No current facility-administered medications for this visit.    PAST MEDICAL HISTORY: Past Medical History:  Diagnosis Date  . Arthritis    chronic neck and back pain  . Breast cancer ([place]) 1997   surgery, chemo, & radiation  . Breast cancer ([place]) 2012   surgery, chemo, & radiation with axillary dissection  . CTS (carpal tunnel syndrome) 2013   Dr [name] , Neurology  . Fasting hyperglycemia 2012   FBS 110  . Hyperlipidemia   . Hypertension   . 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 Memory loss   . MRSA (methicillin resistant staph aureus) culture positive 2012   swab  was positive, treated per protocol.  No other issues.  . Osteopenia   . Personal history of chemotherapy 1997  . Personal history of radiation therapy 1997   x2  . PONV (postoperative nausea and vomiting) 1997   no problems since    PAST SURGICAL HISTORY: Past Surgical History:  Procedure Laterality Date  . AXILLARY NODE DISSECTION  08/2010   left  . AXILLARY NODE DISSECTION Left 2012   with axillary dissection on left  . BREAST BIOPSY  2012   x2  . BREAST LUMPECTOMY  09/1995, 10/1995   left - lumpectomy   . BREAST LUMPECTOMY  2012   Lumpectomy Axilla   . BREAST SURGERY  2012    axillary node dissection left  . CARPAL TUNNEL RELEASE  10/29/2011   Procedure: CARPAL TUNNEL RELEASE;  Surgeon: Cammie Sickle., MD;  Location: [facility];  Service: Orthopedics;  Laterality: Right;  .  cataract surgery  Bilateral 2020  . COLONOSCOPY  2005   negative  . DILATION AND CURETTAGE, DIAGNOSTIC / THERAPEUTIC  2002  . KNEE SURGERY  2000   left  . TRIGGER FINGER RELEASE  10/29/2011   Procedure: RELEASE TRIGGER FINGER/A-1 PULLEY;  Surgeon: Cammie Sickle., MD;  Location: [facility];  Service: Orthopedics;  Laterality: Right;  right long and right index     FAMILY HISTORY: Family History  Problem Relation Age of Onset  . Hypertension Mother   . Arthritis Mother        OA  . Other Father        unknown medical history  . Arthritis Maternal Grandfather        OA  . Heart attack Maternal Grandfather 87  . Heart attack Maternal Uncle 65  . Stroke Neg Hx   . Diabetes Neg Hx   . Breast cancer Neg Hx     SOCIAL HISTORY: Social History   Socioeconomic History  . Marital status: Married    Spouse name: Not on file  . Number of children: 1  . Years of education: college  . Highest education level: Master's degree (e.g., MA, MS, MEng, MEd, MSW, MBA)  Occupational History  . Occupation: Retired  Tobacco Use  . Smoking status: Never Smoker  .  Smokeless tobacco: Never Used  Substance and Sexual Activity  . Alcohol use: No    Alcohol/week: 0.0 standard drinks  . Drug use: No  . Sexual activity: Not on file  Other Topics Concern  . Not on file  Social History Narrative   Retired Licensed conveyancer      Exercise: walking      Lives at home with her husband.   Right-handed.   2-3 cups coffee per day.   Social Determinants of Health   Financial Resource Strain:   . Difficulty of Paying Living Expenses: Not on file  Food Insecurity:   . Worried About Charity fundraiser in the Last Year: Not on file  . Ran Out of Food in the Last Year: Not on file  Transportation Needs:   . Lack of Transportation (Medical): Not on file  . Lack of Transportation (Non-Medical): Not on file  Physical Activity:   . Days of Exercise per Week: Not on file  . Minutes of Exercise per Session: Not on file  Stress:   . Feeling of Stress : Not on file  Social Connections:   . Frequency of Communication with Friends and Family: Not on file  . Frequency of Social Gatherings with Friends and Family: Not on file  . Attends Religious Services: Not on file  . Active Member of Clubs or Organizations: Not on file  . Attends Archivist Meetings: Not on file  . Marital Status: Not on file  Intimate Partner Violence:   . Fear of Current or Ex-Partner: Not on file  . Emotionally Abused: Not on file  . Physically Abused: Not on file  . Sexually Abused: Not on file     PHYSICAL EXAM   Vitals:   08/02/19 1014  BP: 125/75  Pulse: 90  Temp: (!) 97.5 F (36.4 C)  Weight: 112 lb 9.6 oz (51.1 kg)  Height: 5\' 1"  (1.549 m)    Not recorded      Body mass index is 21.28 kg/m.  PHYSICAL EXAMNIATION:  Gen: NAD, conversant, well nourised, well groomed  Cardiovascular: Regular rate rhythm, no peripheral edema, warm, nontender. Eyes: Conjunctivae clear without exudates or hemorrhage Neck: Supple, no carotid bruits. Pulmonary:  Clear to auscultation bilaterally   NEUROLOGICAL EXAM:  MMSE - Mini Mental State Exam 08/02/2019 03/04/2018 12/12/2017  Orientation to time 5 5 5   Orientation to Place 3 5 5   Registration 3 3 3   Attention/ Calculation 4 5 5   Recall 3 3 2   Language- name 2 objects 2 2 2   Language- repeat 1 1 1   Language- follow 3 step command 3 3 3   Language- read & follow direction 1 1 1   Write a sentence 1 1 1   Copy design 1 1 1   Total score 27 30 29   Animal naming 10   CRANIAL NERVES: CN II: Visual fields are full to confrontation. Pupils are round equal and briskly reactive to light. CN III, IV, VI: extraocular movement are normal. No ptosis. CN V: Facial sensation is intact to light touch CN VII: Face is symmetric with normal eye closure  CN VIII: Hearing is normal to causal conversation. CN IX, X: Phonation is normal. CN XI: Head turning and shoulder shrug are intact  MOTOR: There is no pronator drift of out-stretched arms. Muscle bulk and tone are normal. Muscle strength is normal.  Left arm lymphedema, in compression sleeve  REFLEXES: Reflexes are 2+ and symmetric at the biceps, triceps, knees, and ankles. Plantar responses are flexor.  SENSORY: Intact to light touch, pinprick and vibratory sensation are intact in fingers and toes.  COORDINATION: There is no trunk or limb dysmetria noted.  GAIT/STANCE: Posture is normal. Gait is steady with normal steps, base, arm swing, and turning. Heel and toe walking are normal.    DIAGNOSTIC DATA (LABS, IMAGING, TESTING) - I reviewed patient records, labs, notes, testing and imaging myself where available.   ASSESSMENT AND PLAN  Shirley Melendez is a 69 y.o. female   Assessment and Plan:  Dementia:  Slow worsening, fluctuation, hallucinations, out of proportion to her mild memory loss,  Above features does not supported diagnosis of Alzheimer's disease at this point  Will refer her to neuropsychiatric evaluation  Namenda 10 mg twice a  day,   EEG     Marcial Pacas, M.D. Ph.D.  [facility] [address], [place], [place] [address] Ph: [phone] Fax: [phone]  CC: Referring Provider

## 2019-08-03 ENCOUNTER — Encounter: Payer: Self-pay | Admitting: Oncology

## 2019-08-06 ENCOUNTER — Other Ambulatory Visit: Payer: Self-pay

## 2019-08-06 ENCOUNTER — Encounter: Payer: Medicare Other | Admitting: Internal Medicine

## 2019-08-06 ENCOUNTER — Encounter: Payer: Self-pay | Admitting: Internal Medicine

## 2019-08-06 ENCOUNTER — Telehealth: Payer: Self-pay | Admitting: Internal Medicine

## 2019-08-06 NOTE — Telephone Encounter (Signed)
Her virtual visit on Friday 2/19 was not successful. Please call her to schedule an in office f/u appt next week if possible.

## 2019-08-06 NOTE — Progress Notes (Signed)
Virtual Visit via Video Note  I connected with Shirley Melendez on 08/06/19 at  2:00 PM EST by a video enabled telemedicine application and verified that I am speaking with the correct person using two identifiers.   I discussed the limitations of evaluation and management by telemedicine and the availability of in person appointments. The patient expressed understanding and agreed to proceed.  Present for the visit:  Myself, Dr Billey Gosling, [name].  The patient is currently at home and I am in the office.    No referring provider.    History of Present Illness: She is here for follow up of her chronic medical conditions.    Hypertension: She is taking her medication daily. She is compliant with a low sodium diet.  She denies chest pain, palpitations, edema, shortness of breath and regular headaches. She does not monitor her blood pressure at home.    Prediabetes:  She is compliant with a low sugar/carbohydrate diet.  She is exercising regularly.  Neuropathy d/t chemo:  She is taking cymbalta daily.    Mild cognitive impairment:  She is following with Dr [name].   She takes namenda daily.  She saw Dr [name] recently and continues to have intermittent episodes of confusion - not recognizing her husband at times.  Her MMSE score has declined. An EEG was ordered and she was referred for neuropsychiatric evaluation.     ROS      Social History   Socioeconomic History  . Marital status: Married    Spouse name: Not on file  . Number of children: 1  . Years of education: college  . Highest education level: Master's degree (e.g., MA, MS, MEng, MEd, MSW, MBA)  Occupational History  . Occupation: Retired  Tobacco Use  . Smoking status: Never Smoker  . Smokeless tobacco: Never Used  Substance and Sexual Activity  . Alcohol use: No    Alcohol/week: 0.0 standard drinks  . Drug use: No  . Sexual activity: Not on file  Other Topics Concern  . Not on file  Social History Narrative   Retired Licensed conveyancer      Exercise: walking      Lives at home with her husband.   Right-handed.   2-3 cups coffee per day.   Social Determinants of Health   Financial Resource Strain:   . Difficulty of Paying Living Expenses: Not on file  Food Insecurity:   . Worried About Charity fundraiser in the Last Year: Not on file  . Ran Out of Food in the Last Year: Not on file  Transportation Needs:   . Lack of Transportation (Medical): Not on file  . Lack of Transportation (Non-Medical): Not on file  Physical Activity:   . Days of Exercise per Week: Not on file  . Minutes of Exercise per Session: Not on file  Stress:   . Feeling of Stress : Not on file  Social Connections:   . Frequency of Communication with Friends and Family: Not on file  . Frequency of Social Gatherings with Friends and Family: Not on file  . Attends Religious Services: Not on file  . Active Member of Clubs or Organizations: Not on file  . Attends Archivist Meetings: Not on file  . Marital Status: Not on file     Observations/Objective: Appears well in NAD   Assessment and Plan:  See Problem List for Assessment and Plan of chronic medical problems.   Follow Up Instructions:  I discussed the assessment and treatment plan with the patient. The patient was provided an opportunity to ask questions and all were answered. The patient agreed with the plan and demonstrated an understanding of the instructions.   The patient was advised to call back or seek an in-person evaluation if the symptoms worsen or if the condition fails to improve as anticipated.    [name], MD  This encounter was created in error - please disregard.

## 2019-08-10 NOTE — Telephone Encounter (Signed)
LVM for patient to call back and schedule and in office follow up with Dr [name].

## 2019-08-12 NOTE — Progress Notes (Signed)
Subjective:    Patient ID: Shirley Melendez, female    DOB: [birth year], 69 y.o.   MRN: [id number]  HPI The patient is here for follow up of their chronic medical problems, including hypertension, prediabetes, MCI, neuropathy    Medications and allergies reviewed with patient and updated if appropriate.  Patient Active Problem List   Diagnosis Date Noted  . Confusion 08/02/2019  . Mild cognitive impairment 04/02/2018  . High serum vitamin B12 04/02/2018  . Neuropathy due to chemotherapeutic drug ([place]) 02/18/2018  . Prediabetes 12/11/2016  . Essential hypertension, benign 07/16/2013  . Lymphedema of upper extremity following lymphadenectomy 11/07/2011  . Nonspecific abnormal electrocardiogram (ECG) (EKG) 09/27/2011  . Cervicalgia 04/12/2009  . BACK PAIN, LUMBAR 04/12/2009  . Hyperlipidemia 08/24/2008  . Osteoarthritis 08/24/2008  . Osteopenia 08/24/2008  . hx: breast cancer, left, invasive ductal carcinoma with axillary mets, receptor + 08/24/2008    Current Outpatient Medications on File Prior to Visit  Medication Sig Dispense Refill  . anastrozole (ARIMIDEX) 1 MG tablet TAKE ONE TABLET BY MOUTH DAILY 90 tablet 0  . Calcium Carbonate-Vitamin D (CALCIUM 600 + D PO) Take 1 capsule by mouth 2 (two) times daily. 600/400    . DULoxetine (CYMBALTA) 60 MG capsule TAKE ONE CAPSULE BY MOUTH DAILY 90 capsule 0  . ibuprofen (ADVIL,MOTRIN) 200 MG tablet Take 200 mg by mouth every 6 (six) hours as needed.      Marland Kitchen LYSINE PO Take 500 mg by mouth as needed.      . memantine (NAMENDA) 10 MG tablet Take 1 tablet (10 mg total) by mouth 2 (two) times daily. 180 tablet 4  . metoprolol tartrate (LOPRESSOR) 50 MG tablet TAKE ONE TABLET BY MOUTH TWICE A DAY 180 tablet 1  . Multiple Vitamins-Minerals (SENIOR MULTIVITAMIN PLUS PO) Take by mouth daily.      . NON FORMULARY Antifungal cream from [place]    . PROLENSA 0.07 % SOLN Place 1 drop into the left eye daily.    Marland Kitchen spironolactone  (ALDACTONE) 25 MG tablet TAKE ONE TABLET BY MOUTH DAILY 90 tablet 0  . Vitamin E 200 UNITS TABS Take 400 Units by mouth daily.      No current facility-administered medications on file prior to visit.    Past Medical History:  Diagnosis Date  . Arthritis    chronic neck and back pain  . Breast cancer ([place]) 1997   surgery, chemo, & radiation  . Breast cancer ([place]) 2012   surgery, chemo, & radiation with axillary dissection  . CTS (carpal tunnel syndrome) 2013   Dr [name] , Neurology  . Fasting hyperglycemia 2012   FBS 110  . Hyperlipidemia   . Hypertension   . 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 Memory loss   . MRSA (methicillin resistant staph aureus) culture positive 2012   swab was positive, treated per protocol.  No other issues.  . Osteopenia   . Personal history of chemotherapy 1997  . Personal history of radiation therapy 1997   x2  . PONV (postoperative nausea and vomiting) 1997   no problems since    Past Surgical History:  Procedure Laterality Date  . AXILLARY NODE DISSECTION  08/2010   left  . AXILLARY NODE DISSECTION Left 2012   with axillary dissection on left  . BREAST BIOPSY  2012   x2  . BREAST LUMPECTOMY  09/1995, 10/1995   left - lumpectomy   .  BREAST LUMPECTOMY  2012   Lumpectomy Axilla   . BREAST SURGERY  2012    axillary node dissection left  . CARPAL TUNNEL RELEASE  10/29/2011   Procedure: CARPAL TUNNEL RELEASE;  Surgeon: Cammie Sickle., MD;  Location: [facility];  Service: Orthopedics;  Laterality: Right;  . cataract surgery  Bilateral 2020  . COLONOSCOPY  2005   negative  . DILATION AND CURETTAGE, DIAGNOSTIC / THERAPEUTIC  2002  . KNEE SURGERY  2000   left  . TRIGGER FINGER RELEASE  10/29/2011   Procedure: RELEASE TRIGGER FINGER/A-1 PULLEY;  Surgeon: Cammie Sickle., MD;  Location: [facility];  Service: Orthopedics;  Laterality: Right;  right long and  right index     Social History   Socioeconomic History  . Marital status: Married    Spouse name: Not on file  . Number of children: 1  . Years of education: college  . Highest education level: Master's degree (e.g., MA, MS, MEng, MEd, MSW, MBA)  Occupational History  . Occupation: Retired  Tobacco Use  . Smoking status: Never Smoker  . Smokeless tobacco: Never Used  Substance and Sexual Activity  . Alcohol use: No    Alcohol/week: 0.0 standard drinks  . Drug use: No  . Sexual activity: Not on file  Other Topics Concern  . Not on file  Social History Narrative   Retired Licensed conveyancer      Exercise: walking      Lives at home with her husband.   Right-handed.   2-3 cups coffee per day.   Social Determinants of Health   Financial Resource Strain:   . Difficulty of Paying Living Expenses: Not on file  Food Insecurity:   . Worried About Charity fundraiser in the Last Year: Not on file  . Ran Out of Food in the Last Year: Not on file  Transportation Needs:   . Lack of Transportation (Medical): Not on file  . Lack of Transportation (Non-Medical): Not on file  Physical Activity:   . Days of Exercise per Week: Not on file  . Minutes of Exercise per Session: Not on file  Stress:   . Feeling of Stress : Not on file  Social Connections:   . Frequency of Communication with Friends and Family: Not on file  . Frequency of Social Gatherings with Friends and Family: Not on file  . Attends Religious Services: Not on file  . Active Member of Clubs or Organizations: Not on file  . Attends Archivist Meetings: Not on file  . Marital Status: Not on file    Family History  Problem Relation Age of Onset  . Hypertension Mother   . Arthritis Mother        OA  . Other Father        unknown medical history  . Arthritis Maternal Grandfather        OA  . Heart attack Maternal Grandfather 87  . Heart attack Maternal Uncle 65  . Stroke Neg Hx   . Diabetes Neg Hx   .  Breast cancer Neg Hx     Review of Systems     Objective:  There were no vitals filed for this visit. BP Readings from Last 3 Encounters:  08/02/19 125/75  02/19/19 120/72  02/03/19 110/70   Wt Readings from Last 3 Encounters:  08/02/19 112 lb 9.6 oz (51.1 kg)  02/19/19 109 lb 1.6 oz (49.5 kg)  02/03/19  111 lb (50.3 kg)   There is no height or weight on file to calculate BMI.   Physical Exam    Constitutional: Appears well-developed and well-nourished. No distress.  HENT:  Head: Normocephalic and atraumatic.  Neck: Neck supple. No tracheal deviation present. No thyromegaly present.  No cervical lymphadenopathy Cardiovascular: Normal rate, regular rhythm and normal heart sounds.   No murmur heard. No carotid bruit .  No edema Pulmonary/Chest: Effort normal and breath sounds normal. No respiratory distress. No has no wheezes. No rales.  Skin: Skin is warm and dry. Not diaphoretic.  Psychiatric: Normal mood and affect. Behavior is normal.      Assessment & Plan:    See Problem List for Assessment and Plan of chronic medical problems.    This visit occurred during the SARS-CoV-2 public health emergency.  Safety protocols were in place, including screening questions prior to the visit, additional usage of staff PPE, and extensive cleaning of exam room while observing appropriate contact time as indicated for disinfecting solutions.    This encounter was created in error - please disregard.

## 2019-08-12 NOTE — Patient Instructions (Signed)
  Blood work was ordered.     Medications reviewed and updated.  Changes include :     Your prescription(s) have been submitted to your pharmacy. Please take as directed and contact our office if you believe you are having problem(s) with the medication(s).  A referral was ordered for    Please followup in 6 months   

## 2019-08-13 ENCOUNTER — Encounter: Payer: Medicare PPO | Admitting: Internal Medicine

## 2019-08-19 ENCOUNTER — Inpatient Hospital Stay: Payer: Medicare PPO | Attending: Oncology

## 2019-08-19 ENCOUNTER — Inpatient Hospital Stay: Payer: Medicare PPO

## 2019-08-19 ENCOUNTER — Ambulatory Visit: Payer: Medicare Other | Admitting: Oncology

## 2019-08-19 ENCOUNTER — Inpatient Hospital Stay (HOSPITAL_BASED_OUTPATIENT_CLINIC_OR_DEPARTMENT_OTHER): Payer: Medicare PPO | Admitting: Oncology

## 2019-08-19 ENCOUNTER — Ambulatory Visit: Payer: Medicare Other

## 2019-08-19 ENCOUNTER — Other Ambulatory Visit: Payer: Medicare Other

## 2019-08-19 ENCOUNTER — Other Ambulatory Visit: Payer: Self-pay

## 2019-08-19 VITALS — BP 127/76 | HR 79 | Temp 99.2°F | Resp 16 | Ht 61.0 in | Wt 110.0 lb

## 2019-08-19 DIAGNOSIS — Z853 Personal history of malignant neoplasm of breast: Secondary | ICD-10-CM

## 2019-08-19 DIAGNOSIS — Z170 Estrogen receptor positive status [ER+]: Secondary | ICD-10-CM | POA: Diagnosis not present

## 2019-08-19 DIAGNOSIS — R232 Flushing: Secondary | ICD-10-CM | POA: Diagnosis not present

## 2019-08-19 DIAGNOSIS — M8580 Other specified disorders of bone density and structure, unspecified site: Secondary | ICD-10-CM | POA: Insufficient documentation

## 2019-08-19 DIAGNOSIS — E538 Deficiency of other specified B group vitamins: Secondary | ICD-10-CM | POA: Diagnosis not present

## 2019-08-19 DIAGNOSIS — C773 Secondary and unspecified malignant neoplasm of axilla and upper limb lymph nodes: Secondary | ICD-10-CM | POA: Diagnosis not present

## 2019-08-19 DIAGNOSIS — R609 Edema, unspecified: Secondary | ICD-10-CM | POA: Insufficient documentation

## 2019-08-19 DIAGNOSIS — I890 Lymphedema, not elsewhere classified: Secondary | ICD-10-CM | POA: Insufficient documentation

## 2019-08-19 DIAGNOSIS — I10 Essential (primary) hypertension: Secondary | ICD-10-CM | POA: Diagnosis not present

## 2019-08-19 DIAGNOSIS — C50912 Malignant neoplasm of unspecified site of left female breast: Secondary | ICD-10-CM | POA: Insufficient documentation

## 2019-08-19 DIAGNOSIS — Z79811 Long term (current) use of aromatase inhibitors: Secondary | ICD-10-CM | POA: Insufficient documentation

## 2019-08-19 DIAGNOSIS — F0390 Unspecified dementia without behavioral disturbance: Secondary | ICD-10-CM | POA: Diagnosis not present

## 2019-08-19 LAB — CBC WITH DIFFERENTIAL (CANCER CENTER ONLY)
Abs Immature Granulocytes: 0.03 10*3/uL (ref 0.00–0.07)
Basophils Absolute: 0 10*3/uL (ref 0.0–0.1)
Basophils Relative: 1 %
Eosinophils Absolute: 0.1 10*3/uL (ref 0.0–0.5)
Eosinophils Relative: 1 %
HCT: 44.2 % (ref 36.0–46.0)
Hemoglobin: 15.2 g/dL — ABNORMAL HIGH (ref 12.0–15.0)
Immature Granulocytes: 0 %
Lymphocytes Relative: 13 %
Lymphs Abs: 1.1 10*3/uL (ref 0.7–4.0)
MCH: 31.9 pg (ref 26.0–34.0)
MCHC: 34.4 g/dL (ref 30.0–36.0)
MCV: 92.7 fL (ref 80.0–100.0)
Monocytes Absolute: 0.8 10*3/uL (ref 0.1–1.0)
Monocytes Relative: 9 %
Neutro Abs: 6.7 10*3/uL (ref 1.7–7.7)
Neutrophils Relative %: 76 %
Platelet Count: 296 10*3/uL (ref 150–400)
RBC: 4.77 MIL/uL (ref 3.87–5.11)
RDW: 12.3 % (ref 11.5–15.5)
WBC Count: 8.8 10*3/uL (ref 4.0–10.5)
nRBC: 0 % (ref 0.0–0.2)

## 2019-08-19 LAB — BASIC METABOLIC PANEL - CANCER CENTER ONLY
Anion gap: 6 (ref 5–15)
BUN: 16 mg/dL (ref 8–23)
CO2: 24 mmol/L (ref 22–32)
Calcium: 9 mg/dL (ref 8.9–10.3)
Chloride: 104 mmol/L (ref 98–111)
Creatinine: 0.98 mg/dL (ref 0.44–1.00)
GFR, Est AFR Am: >60 mL/min (ref >60)
GFR, Estimated: 59 mL/min — ABNORMAL LOW (ref >60)
Glucose, Bld: 128 mg/dL — ABNORMAL HIGH (ref 70–99)
Potassium: 4.3 mmol/L (ref 3.5–5.1)
Sodium: 134 mmol/L — ABNORMAL LOW (ref 135–145)

## 2019-08-19 LAB — VITAMIN B12: Vitamin B-12: 638 pg/mL (ref 180–914)

## 2019-08-19 MED ORDER — ZOLEDRONIC ACID 4 MG/5ML IV CONC
4.0000 mg | Freq: Once | INTRAVENOUS | Status: AC
  Administered 2019-08-19: 4 mg via INTRAVENOUS
  Filled 2019-08-19: qty 5

## 2019-08-19 MED ORDER — ZOLEDRONIC ACID 4 MG/100ML IV SOLN
INTRAVENOUS | Status: AC
  Filled 2019-08-19: qty 100

## 2019-08-19 MED ORDER — SODIUM CHLORIDE 0.9 % IV SOLN
INTRAVENOUS | Status: DC
  Filled 2019-08-19: qty 250

## 2019-08-19 NOTE — Patient Instructions (Signed)
Zoledronic Acid injection (Hypercalcemia, Oncology) What is this medicine? ZOLEDRONIC ACID (ZOE le dron ik AS id) lowers the amount of calcium loss from bone. It is used to treat too much calcium in your blood from cancer. It is also used to prevent complications of cancer that has spread to the bone. This medicine may be used for other purposes; ask your health care provider or pharmacist if you have questions. COMMON BRAND NAME(S): Zometa What should I tell my health care provider before I take this medicine? They need to know if you have any of these conditions:  aspirin-sensitive asthma  cancer, especially if you are receiving medicines used to treat cancer  dental disease or wear dentures  infection  kidney disease  receiving corticosteroids like dexamethasone or prednisone  an unusual or allergic reaction to zoledronic acid, other medicines, foods, dyes, or preservatives  pregnant or trying to get pregnant  breast-feeding How should I use this medicine? This medicine is for infusion into a vein. It is given by a health care professional in a hospital or clinic setting. Talk to your pediatrician regarding the use of this medicine in children. Special care may be needed. Overdosage: If you think you have taken too much of this medicine contact a poison control center or emergency room at once. NOTE: This medicine is only for you. Do not share this medicine with others. What if I miss a dose? It is important not to miss your dose. Call your doctor or health care professional if you are unable to keep an appointment. What may interact with this medicine?  certain antibiotics given by injection  NSAIDs, medicines for pain and inflammation, like ibuprofen or naproxen  some diuretics like bumetanide, furosemide  teriparatide  thalidomide This list may not describe all possible interactions. Give your health care provider a list of all the medicines, herbs, non-prescription  drugs, or dietary supplements you use. Also tell them if you smoke, drink alcohol, or use illegal drugs. Some items may interact with your medicine. What should I watch for while using this medicine? Visit your doctor or health care professional for regular checkups. It may be some time before you see the benefit from this medicine. Do not stop taking your medicine unless your doctor tells you to. Your doctor may order blood tests or other tests to see how you are doing. Women should inform their doctor if they wish to become pregnant or think they might be pregnant. There is a potential for serious side effects to an unborn child. Talk to your health care professional or pharmacist for more information. You should make sure that you get enough calcium and vitamin D while you are taking this medicine. Discuss the foods you eat and the vitamins you take with your health care professional. Some people who take this medicine have severe bone, joint, and/or muscle pain. This medicine may also increase your risk for jaw problems or a broken thigh bone. Tell your doctor right away if you have severe pain in your jaw, bones, joints, or muscles. Tell your doctor if you have any pain that does not go away or that gets worse. Tell your dentist and dental surgeon that you are taking this medicine. You should not have major dental surgery while on this medicine. See your dentist to have a dental exam and fix any dental problems before starting this medicine. Take good care of your teeth while on this medicine. Make sure you see your dentist for regular follow-up   appointments. What side effects may I notice from receiving this medicine? Side effects that you should report to your doctor or health care professional as soon as possible:  allergic reactions like skin rash, itching or hives, swelling of the face, lips, or tongue  anxiety, confusion, or depression  breathing problems  changes in vision  eye  pain  feeling faint or lightheaded, falls  jaw pain, especially after dental work  mouth sores  muscle cramps, stiffness, or weakness  redness, blistering, peeling or loosening of the skin, including inside the mouth  trouble passing urine or change in the amount of urine Side effects that usually do not require medical attention (report to your doctor or health care professional if they continue or are bothersome):  bone, joint, or muscle pain  constipation  diarrhea  fever  hair loss  irritation at site where injected  loss of appetite  nausea, vomiting  stomach upset  trouble sleeping  trouble swallowing  weak or tired This list may not describe all possible side effects. Call your doctor for medical advice about side effects. You may report side effects to FDA at 1-800-FDA-1088. Where should I keep my medicine? This drug is given in a hospital or clinic and will not be stored at home. NOTE: This sheet is a summary. It may not cover all possible information. If you have questions about this medicine, talk to your doctor, pharmacist, or health care provider.  2020 Elsevier/Gold Standard (2013-10-30 14:19:39)  

## 2019-08-19 NOTE — Progress Notes (Addendum)
  [facility] OFFICE PROGRESS NOTE   Diagnosis: Breast cancer  INTERVAL HISTORY:   Ms. Flemings returns as scheduled.  A bilateral mammogram on 02/26/2019 was negative.  She continues Arimidex.  She has hot flashes several times per day.  She reports arthralgias for years.  She had a painful tooth and saw the dentist.  This has improved.  She reports the dentist is aware she has been treated with Zometa.  Stable lymphedema of the left arm.  Objective:  Vital signs in last 24 hours:  Blood pressure 127/76, pulse 79, temperature 99.2 F (37.3 C), temperature source Temporal, resp. rate 16, height '5\' 1"'$  (1.549 m), weight 110 lb (49.9 kg), SpO2 100 %.    Lymphatics: No cervical, supraclavicular, or left axillary nodes, pea-sized mobile right axillary node GI: No hepatomegaly Vascular: No leg edema Breast: Status post left lumpectomy.  No mass in either breast.  Both axillae appear benign.     Lab Results:  Lab Results  Component Value Date   WBC 8.8 08/19/2019   HGB 15.2 (H) 08/19/2019   HCT 44.2 08/19/2019   MCV 92.7 08/19/2019   PLT 296 08/19/2019   NEUTROABS 6.7 08/19/2019    CMP  Lab Results  Component Value Date   NA 134 (L) 08/19/2019   K 4.3 08/19/2019   CL 104 08/19/2019   CO2 24 08/19/2019   GLUCOSE 128 (H) 08/19/2019   BUN 16 08/19/2019   CREATININE 0.98 08/19/2019   CALCIUM 9.0 08/19/2019   PROT 6.5 02/09/2019   ALBUMIN 4.3 02/09/2019   AST 15 02/09/2019   ALT 19 02/09/2019   ALKPHOS 62 02/09/2019   BILITOT 0.7 02/09/2019   GFRNONAA 59 (L) 08/19/2019   GFRAA >60 08/19/2019     Medications: I have reviewed the patient's current medications.   Assessment/Plan: 1. Stage I (T1 NX) left-sided breast cancer diagnosed in April 1997. She was treated with a lumpectomy, left breast radiation, CMF chemotherapy, and 5 years of tamoxifen. 2. Metastatic carcinoma involving a left axillary lymph node, ER-positive, PR-positive, and HER-2-negative.  She is status post a biopsy of a left axillary lymph node 08/13/2010. Status post a left axillary lymph node dissection 09/13/2010 with the pathology confirming 3/11 axillary lymph nodes containing metastatic carcinoma. A staging PET scan on 08/23/2010 confirmed a hypermetabolic left axillary lymph node and no other evidence of metastatic disease.  1. Status post 4 cycles of "adjuvant" T-C chemotherapy with cycle #4 given on 01/07/2011. 2. She began Arimidex on 03/26/2011 3. Left arm lymphedema, likely lymphedema related to the left axillary lymph node dissection. She is now followed at the lymphedema clinic. Improved with physical therapy and a lymphedema sleeve. 4. Pain and numbness at the fingers bilaterally-right greater than left, she reports being diagnosed with carpal tunnel syndrome and underwent a right sided carpal tunnel surgery with clinical improvement  5. Osteopenia-she received Zometa in July of 2018.bone density scanMay 2018confirmed osteopenia at the femoral neck 6. Hypertension 7.  Altered mental status 2019-undergoing evaluation by neurology, diagnosed with dementia 8.  Elevated vitamin B12 level 04/02/2018- nonspecific    Disposition: Ms. Mccarrell is in remission from breast cancer.  She will continue Arimidex.  She will receive Zometa today.  The hemoglobin is stable and the high normal range.  I have a low suspicion for a myeloproliferative disorder.  She will return for an office visit in 6 months.  Betsy Coder, MD  08/19/2019  12:54 PM

## 2019-08-20 ENCOUNTER — Telehealth: Payer: Self-pay | Admitting: Oncology

## 2019-08-20 NOTE — Telephone Encounter (Signed)
Scheduled per los. Called and left msg. Mailed printout  °

## 2019-08-22 ENCOUNTER — Encounter: Payer: Self-pay | Admitting: Oncology

## 2019-08-23 ENCOUNTER — Encounter: Payer: Self-pay | Admitting: *Deleted

## 2019-08-23 ENCOUNTER — Ambulatory Visit: Payer: Medicare PPO | Admitting: Neurology

## 2019-08-23 DIAGNOSIS — R410 Disorientation, unspecified: Secondary | ICD-10-CM | POA: Diagnosis not present

## 2019-08-23 DIAGNOSIS — G3184 Mild cognitive impairment, so stated: Secondary | ICD-10-CM

## 2019-08-25 ENCOUNTER — Ambulatory Visit: Payer: Medicare Other | Admitting: Podiatry

## 2019-08-26 NOTE — Procedures (Signed)
   HISTORY: 69 year old female presented with progressive worsening memory loss  TECHNIQUE:  This is a routine 16 channel EEG recording with one channel devoted to a limited EKG recording.  It was performed during wakefulness, drowsiness and asleep.  Hyperventilation and photic stimulation were performed as activating procedures.  There are minimum muscle and movement artifact noted.  Upon maximum arousal, posterior dominant waking rhythm consistent of dysrhythmic theta range activity with frequency of 6 hz. Activities are symmetric over the bilateral posterior derivations and attenuated with eye opening.  Hyperventilation produced mild/moderate buildup with higher amplitude and the slower activities noted.  Photic stimulation did not alter the tracing.  During EEG recording, patient developed drowsiness and no deeper stage of sleep was achieved During EEG recording, there was no epileptiform discharge noted.  EKG demonstrate sinus rhythm, with heart rate of 84 bpm  CONCLUSION: This is an abnormal EEG.  There is electrodiagnostic evidence of generalized background slowing, indicating mild hemisphere malfunction, common etiology metabolic and central nervous system degenerative disorder.  Marcial Pacas, M.D. Ph.D.  [facility] [place], [place] [address] Phone: [phone] Fax:      [phone]

## 2019-08-27 ENCOUNTER — Ambulatory Visit: Payer: Medicare PPO | Admitting: Internal Medicine

## 2019-08-30 ENCOUNTER — Telehealth: Payer: Self-pay | Admitting: Neurology

## 2019-08-30 NOTE — Telephone Encounter (Signed)
I called and left a message for her daughter to return my call Shirley Melendez on [facility]).

## 2019-08-30 NOTE — Telephone Encounter (Signed)
Left second message for patient's dgt to call me back.

## 2019-08-30 NOTE — Telephone Encounter (Signed)
I returned the call to the patient's daughter on [place]. She feels her mother's memory is holding steady but her father sometimes feels she may need additional medication. She was not able to tolerate donepezil in the past. She is going to speak to her mother to see if she would like to try Exelon or wait to be re-evaluated at her appt in August.  She will send a mychart message or call if she wants to start the medication.

## 2019-08-30 NOTE — Telephone Encounter (Signed)
Hello Dr. [name], I was able to see the results of mom's [facility]) EEG that was performed on 08/23/19. I don't exactly know what the information means, but is it simply confirming the beginnings of dementia? It doesn't look like signs of seizure were found. Mom was wondering if any follow up is needed, or if we should continue with current plan of care until we return in August for our scheduled appointment? She and dad are wondering if increasing the Namenda would help, but I think she's already at a fairly high dose (?).  It might be helpful to have the nurse call and explain this to them. I also wanted to ask here since they both don't have the best memory/recall.  Thank you! Shirley Melendez [phone]  Please call patient, EEG showed mild background slowing, indicating mild bihemispheric malfunction, this can be related to dementia, There is no evidence of epileptiform discharge,  She is already on recommended maximum dose of Namenda 10 mg twice a day, we may try Aricept 10 mg daily or Exelon patch (both belong to acetylcholinesterase inhibitor)  The best way to preserve the memory is to be physically active, encouraged her to keep moderate exercise daily.  She has a follow-up visit scheduled with our office on February 07, 2020,

## 2019-09-26 ENCOUNTER — Other Ambulatory Visit: Payer: Self-pay | Admitting: Oncology

## 2019-09-26 DIAGNOSIS — Z853 Personal history of malignant neoplasm of breast: Secondary | ICD-10-CM

## 2019-10-06 ENCOUNTER — Ambulatory Visit: Payer: Medicare PPO | Admitting: Podiatry

## 2019-12-27 ENCOUNTER — Ambulatory Visit: Payer: Medicare PPO | Admitting: Podiatry

## 2020-01-16 ENCOUNTER — Other Ambulatory Visit: Payer: Self-pay | Admitting: Oncology

## 2020-01-16 ENCOUNTER — Other Ambulatory Visit: Payer: Self-pay | Admitting: Internal Medicine

## 2020-01-16 DIAGNOSIS — I10 Essential (primary) hypertension: Secondary | ICD-10-CM

## 2020-01-16 DIAGNOSIS — Z853 Personal history of malignant neoplasm of breast: Secondary | ICD-10-CM

## 2020-01-17 ENCOUNTER — Other Ambulatory Visit: Payer: Self-pay | Admitting: Internal Medicine

## 2020-02-07 ENCOUNTER — Encounter: Payer: Self-pay | Admitting: Neurology

## 2020-02-07 ENCOUNTER — Encounter: Payer: Self-pay | Admitting: Internal Medicine

## 2020-02-07 ENCOUNTER — Ambulatory Visit: Payer: Medicare PPO | Admitting: Neurology

## 2020-02-07 VITALS — BP 120/79 | HR 97 | Ht 61.0 in | Wt 108.0 lb

## 2020-02-07 DIAGNOSIS — F0390 Unspecified dementia without behavioral disturbance: Secondary | ICD-10-CM

## 2020-02-07 DIAGNOSIS — R413 Other amnesia: Secondary | ICD-10-CM | POA: Diagnosis not present

## 2020-02-07 HISTORY — DX: Unspecified dementia, unspecified severity, without behavioral disturbance, psychotic disturbance, mood disturbance, and anxiety: F03.90

## 2020-02-07 MED ORDER — RIVASTIGMINE TARTRATE 1.5 MG PO CAPS: 1.5000 mg | ORAL_CAPSULE | Freq: Two times a day (BID) | ORAL | 5 refills | Status: DC

## 2020-02-07 NOTE — Progress Notes (Signed)
PATIENT: Shirley Melendez DOB: [birth year]  REASON FOR VISIT: follow up HISTORY FROM: patient  HISTORY OF PRESENT ILLNESS: Today 02/07/20  HISTORY Shirley Melendez is a 69 year old female, seen in request by [name], [name] for evaluation of memory loss, she is accompanied by her husband Shirley Melendez at today's clinical visit, initial evaluation was on March 04, 2018.  I have reviewed and summarized the referring note from the referring physician, she had a history of left breast cancer, [name], stage I left-sided breast cancer in April 1997, treated with lobectomy radiation, CMF chemotherapy, 5 years of tamoxifen, metastatic carcinoma involving left axillary lymph node, had left axillary lymph node dissection on September 13, 2010, status post 4 cycle adjuvant TC chemotherapy, completed on January 07, 2011, begin Arimidex in December 2012, left arm lymphedema,  She is a retired Art therapist, enjoys reading, had a sudden onset memory change since May 2019, she has chronic insomnia, was given a prescription of trazodone in May 2019 to June 2019, which did not help her sleep much, but during that period of time, she began to have spells of hallucinations, seeing people at her house, woke up in the middle of the night confused, staring at her husband could not recognize him, spell lasted for few minutes, intermittent,  Since trazodone was stopped, she remains symptomatic, sometimes while eating breakfast, she is confused where she is, could not recognize her husband  UPDATE Apr 02 2018: She is accompanied by her husband and daughter Shirley Melendez at today's visit, we personally reviewed MRI of the brain with without contrast on March 21, 2018, there is evidence of generalized atrophy, progressed compared to previous CT scan in 2010, mild supratentorium small vessel disease.  She has stopped trazodone since August 2019, she continue have episode of confusion,  she gave me an example, one afternoon she came back from outside to her house, saw a man she seems to know before trying explain to her that she married to him for 41 years, there are three different mans, husband explained that it was him trying to explains to her what is going on, she was confused.  She denies significant memory loss, but has intermittent confusion spells,  Laboratory evaluations showed elevated B12 1698, mild elevated creatinine 1.02, elevated total cholesterol 227, LDL 128, normal CBC, TSH, A1c was 5.8,  Virtual Visit via telephone on October 07 2018  She is with her husband during the interview, reviewed her medication list, she is only taking arimidex 1mg  daily, cal-Vit D, cymbalta 60mg  daily, lysine 500mg  prn, metoprolol 50mg  daily, MVI, spironolactone 25mg  daily. Vit E.  She has tried aricept, complains of dizziness and GI side effect, has vivid nightmares, falling off her bed during sleep, never started on namenda, was not able to complete neuropsychiatric evaluations,   She is doing better, she eats well, sleeps well now, she still cooks, work on plant in her garden, likes to read, fiction, historical stories. She is not exercise regularly.  She does not have hallucinations, occasionally floaters.   Laboratory evaluations in Jan 2020, Vit B12 1170, BMP, Creat 1.16, normal CBC  UPDATE Aug 02 2019: She is accompanied by her daughter at today's clinical visit, she continues to decline slowly, there was intermittent episode of confusion, to the point that she could not recognize her husband, repeating questions, she is often frustrated about it.  She has good appetite, she denies difficulty moving, was relatively isolated during  winter months and COVID-19.   Update February 07, 2020 SS: EEG in March 2021 showed generalized background slowing, indicating mild bihemispheric malfunction, common etiology metabolic and central nervous system degenerative  disorder.  Accompanied by her daughter, remains on Namenda.  Feels doing fairly well.  Her husband has taken over organizing her pillbox, she mostly remembers to take the pills.  She does her own ADLs, cooking, but getting take out for dinner, having a cleaning lady come in.  No longer driving, decided with her family to ensure safety.  Denies any hallucinations.  Does report floaters and peripheral vision, described as bright light or "movement", not hallucinations.  Daughter thinks hallucinations, may actually be that she couldn't remember family members, thought it was strange man, but actually her husband.  No recent falls.  Has noted some occasional groaning, could be awake or asleep, felt to be related to anxiety. Never had neuropsychological evaluation. Aricept previously resulted in GI symptoms and vivid dreams.  REVIEW OF SYSTEMS: Out of a complete 14 system review of symptoms, the patient complains only of the following symptoms, and all other reviewed systems are negative.  Memory loss   ALLERGIES: Allergies  Allergen Reactions   Compazine Other (See Comments)    Muscle spasms in neck & back with jaw tightness   Donepezil Other (See Comments)    Upset stomach   Trazodone And Nefazodone     Confusion and hallucinations   Erythromycin Nausea And Vomiting   Prochlorperazine Edisylate     nausea    HOME MEDICATIONS: Outpatient Medications Prior to Visit  Medication Sig Dispense Refill   anastrozole (ARIMIDEX) 1 MG tablet TAKE ONE TABLET BY MOUTH DAILY 90 tablet 3   Calcium Carbonate-Vitamin D (CALCIUM 600 + D PO) Take 1 capsule by mouth 2 (two) times daily. 600/400     DULoxetine (CYMBALTA) 60 MG capsule Take 1 capsule (60 mg total) by mouth daily. Due for follow up 30 capsule 0   ibuprofen (ADVIL,MOTRIN) 200 MG tablet Take 200 mg by mouth every 6 (six) hours as needed.       LYSINE PO Take 500 mg by mouth as needed.       memantine (NAMENDA) 10 MG tablet Take 1 tablet  (10 mg total) by mouth 2 (two) times daily. 180 tablet 4   metoprolol tartrate (LOPRESSOR) 50 MG tablet TAKE ONE TABLET BY MOUTH TWICE A DAY 60 tablet 0   Multiple Vitamins-Minerals (SENIOR MULTIVITAMIN PLUS PO) Take by mouth daily.       NON FORMULARY Antifungal cream from [facility]     spironolactone (ALDACTONE) 25 MG tablet TAKE ONE TABLET BY MOUTH DAILY 90 tablet 0   Vitamin E 200 UNITS TABS Take 400 Units by mouth daily.      No facility-administered medications prior to visit.    PAST MEDICAL HISTORY: Past Medical History:  Diagnosis Date   Arthritis    chronic neck and back pain   Breast cancer ([place]) 1997   surgery, chemo, & radiation   Breast cancer ([place]) 2012   surgery, chemo, & radiation with axillary dissection   CTS (carpal tunnel syndrome) 2013   Dr [name] , Neurology   Fasting hyperglycemia 2012   FBS 110   Hyperlipidemia    Hypertension    Left arm swelling 2012   lymphadema from axillary dissection, wears compression sleeve   Memory loss    MRSA (methicillin resistant staph aureus) culture positive 2012   swab was positive, treated per  protocol.  No other issues.   Osteopenia    Personal history of chemotherapy 1997   Personal history of radiation therapy 1997   x2   PONV (postoperative nausea and vomiting) 1997   no problems since    PAST SURGICAL HISTORY: Past Surgical History:  Procedure Laterality Date   AXILLARY NODE DISSECTION  08/2010   left   AXILLARY NODE DISSECTION Left 2012   with axillary dissection on left   BREAST BIOPSY  2012   x2   BREAST LUMPECTOMY  09/1995, 10/1995   left - lumpectomy    BREAST LUMPECTOMY  2012   Lumpectomy Axilla    BREAST SURGERY  2012    axillary node dissection left   CARPAL TUNNEL RELEASE  10/29/2011   Procedure: CARPAL TUNNEL RELEASE;  Surgeon: Cammie Sickle., MD;  Location: [facility];  Service: Orthopedics;  Laterality: Right;   cataract surgery   Bilateral 2020   COLONOSCOPY  2005   negative   DILATION AND CURETTAGE, DIAGNOSTIC / THERAPEUTIC  2002   KNEE SURGERY  2000   left   TRIGGER FINGER RELEASE  10/29/2011   Procedure: RELEASE TRIGGER FINGER/A-1 PULLEY;  Surgeon: Cammie Sickle., MD;  Location: [facility];  Service: Orthopedics;  Laterality: Right;  right long and right index     FAMILY HISTORY: Family History  Problem Relation Age of Onset   Hypertension Mother    Arthritis Mother        OA   Other Father        unknown medical history   Arthritis Maternal Grandfather        OA   Heart attack Maternal Grandfather 87   Heart attack Maternal Uncle 65   Stroke Neg Hx    Diabetes Neg Hx    Breast cancer Neg Hx     SOCIAL HISTORY: Social History   Socioeconomic History   Marital status: Married    Spouse name: Not on file   Number of children: 1   Years of education: college   Highest education level: Master's degree (e.g., MA, MS, MEng, MEd, MSW, MBA)  Occupational History   Occupation: Retired  Tobacco Use   Smoking status: Never Smoker   Smokeless tobacco: Never Used  Scientific laboratory technician Use: Never used  Substance and Sexual Activity   Alcohol use: No    Alcohol/week: 0.0 standard drinks   Drug use: No   Sexual activity: Not on file  Other Topics Concern   Not on file  Social History Narrative   Retired Licensed conveyancer      Exercise: walking      Lives at home with her husband.   Right-handed.   2-3 cups coffee per day.   Social Determinants of Health   Financial Resource Strain:    Difficulty of Paying Living Expenses: Not on file  Food Insecurity:    Worried About Charity fundraiser in the Last Year: Not on file   [employer] of Food in the Last Year: Not on file  Transportation Needs:    Lack of Transportation (Medical): Not on file   Lack of Transportation (Non-Medical): Not on file  Physical Activity:    Days of Exercise per Week: Not on  file   Minutes of Exercise per Session: Not on file  Stress:    Feeling of Stress : Not on file  Social Connections:    Frequency of Communication with Friends and  Family: Not on file   Frequency of Social Gatherings with Friends and Family: Not on file   Attends Religious Services: Not on file   Active Member of Clubs or Organizations: Not on file   Attends Archivist Meetings: Not on file   Marital Status: Not on file  Intimate Partner Violence:    Fear of Current or Ex-Partner: Not on file   Emotionally Abused: Not on file   Physically Abused: Not on file   Sexually Abused: Not on file    PHYSICAL EXAM  Vitals:   02/07/20 1040  BP: 120/79  Pulse: 97  Weight: 108 lb (49 kg)  Height: 5\' 1"  (1.549 m)   Body mass index is 20.41 kg/m. MMSE - Mini Mental State Exam 02/07/2020 08/02/2019 03/04/2018  Orientation to time 3 5 5   Orientation to Place 4 3 5   Registration 3 3 3   Attention/ Calculation 1 4 5   Recall 3 3 3   Language- name 2 objects 2 2 2   Language- repeat 1 1 1   Language- follow 3 step command 3 3 3   Language- read & follow direction 1 1 1   Write a sentence 1 1 1   Copy design 0 1 1  Copy design-comments 10 animals - -  Total score 22 27 30     Generalized: Well developed, in no acute distress   Neurological examination  Mentation: Alert oriented to time, place, history taking. Follows all commands speech and language fluent Cranial nerve II-XII: Pupils were equal round reactive to light. Extraocular movements were full, visual field were full on confrontational test. Facial sensation and strength were normal. Head turning and shoulder shrug  were normal and symmetric. Motor: The motor testing reveals 5 over 5 strength of all 4 extremities. Good symmetric motor tone is noted throughout.  Sensory: Sensory testing is intact to soft touch on all 4 extremities. No evidence of extinction is noted.  Coordination: Cerebellar testing reveals good  finger-nose-finger and heel-to-shin bilaterally.  Gait and station: Gait is normal.  Reflexes: Deep tendon reflexes are symmetric and normal bilaterally.   DIAGNOSTIC DATA (LABS, IMAGING, TESTING) - I reviewed patient records, labs, notes, testing and imaging myself where available.  Lab Results  Component Value Date   WBC 8.8 08/19/2019   HGB 15.2 (H) 08/19/2019   HCT 44.2 08/19/2019   MCV 92.7 08/19/2019   PLT 296 08/19/2019      Component Value Date/Time   NA 134 (L) 08/19/2019 1202   NA 139 01/06/2017 1200   K 4.3 08/19/2019 1202   K 4.0 01/06/2017 1200   CL 104 08/19/2019 1202   CO2 24 08/19/2019 1202   CO2 26 01/06/2017 1200   GLUCOSE 128 (H) 08/19/2019 1202   GLUCOSE 112 01/06/2017 1200   BUN 16 08/19/2019 1202   BUN 17.8 01/06/2017 1200   CREATININE 0.98 08/19/2019 1202   CREATININE 1.0 01/06/2017 1200   CALCIUM 9.0 08/19/2019 1202   CALCIUM 9.9 01/06/2017 1200   PROT 6.5 02/09/2019 1337   ALBUMIN 4.3 02/09/2019 1337   AST 15 02/09/2019 1337   AST 20 05/04/2018 1321   ALT 19 02/09/2019 1337   ALT 21 05/04/2018 1321   ALKPHOS 62 02/09/2019 1337   BILITOT 0.7 02/09/2019 1337   BILITOT 0.8 05/04/2018 1321   GFRNONAA 59 (L) 08/19/2019 1202   GFRAA >60 08/19/2019 1202   Lab Results  Component Value Date   CHOL 227 (H) 02/09/2019   HDL 85.90 02/09/2019   LDLCALC 113 (  H) 02/09/2019   LDLDIRECT 129.3 09/27/2011   TRIG 143.0 02/09/2019   CHOLHDL 3 02/09/2019   Lab Results  Component Value Date   HGBA1C 5.9 02/09/2019   Lab Results  Component Value Date   BTDHRCBU38 453 08/19/2019   Lab Results  Component Value Date   TSH 1.39 02/09/2019   ASSESSMENT AND PLAN 69 y.o. year old female  has a past medical history of Arthritis, Breast cancer ([place]) (1997), Breast cancer ([place]) (2012), CTS (carpal tunnel syndrome) (2013), Fasting hyperglycemia (2012), Hyperlipidemia, Hypertension, Left arm swelling (2012), Memory loss, MRSA (methicillin resistant staph aureus)  culture positive (2012), Osteopenia, Personal history of chemotherapy (1997), Personal history of radiation therapy (1997), and PONV (postoperative nausea and vomiting) (1997). here with:  1.  Dementia  -Slow worsening, fluctuating symptoms, hallucinations, visual disturbances, MMSE 22/30  -Will refer for neuropsychological evaluation  -Continue Namenda 10 mg twice a day  -Add on Exelon 1.5 mg twice daily, previously cannot tolerate Aricept  -Encouraged ophthalmology evaluation for peripheral vision disturbances  -EEG showed generalized background slowing, indicating mild hemisphere malfunction, common etiology metabolic and central nervous system degenerative disorder, no epileptiform discharge noted  -Follow-up in 6 months or sooner if needed  I spent 30 minutes of face-to-face and non-face-to-face time with patient.  This included previsit chart review, lab review, study review, order entry, electronic health record documentation, patient education.  Butler Denmark, AGNP-C, DNP 02/07/2020, 10:50 AM [facility] [address] [place], [place] [address] [phone]

## 2020-02-07 NOTE — Patient Instructions (Signed)
Let's try Exelon 1.5 mg twice daily for memory  Continue Namenda I will refer you to the neuropsychologist  See you back in 6 months   Rivastigmine capsules What is this medicine? RIVASTIGMINE (ri va STIG meen) is used to treat mild to moderate dementia caused by Alzheimer's disease or Parkinson's disease. This medicine may be used for other purposes; ask your health care provider or pharmacist if you have questions. COMMON BRAND NAME(S): Exelon What should I tell my health care provider before I take this medicine? They need to know if you have any of these conditions:  difficulty passing urine  heart disease, or irregular or slow heartbeat  kidney disease  liver disease  lung or breathing disease, like asthma  seizures  stomach or intestine disease, ulcers, or stomach bleeding  an unusual or allergic reaction to rivastigmine, other medicines, foods, dyes, or preservatives  pregnant or trying to get pregnant  breast-feeding How should I use this medicine? Take this medicine by mouth with a glass of water. Follow the directions on the prescription label. Take this medicine with food. Take your doses at regular intervals. Do not take your medicine more often than directed. Do not stop taking except on the advice of your doctor or health care professional. Talk to your pediatrician regarding the use of this medicine in children. Special care may be needed. Overdosage: If you think you have taken too much of this medicine contact a poison control center or emergency room at once. NOTE: This medicine is only for you. Do not share this medicine with others. What if I miss a dose? If you miss a dose, take it as soon as you can. If it is almost time for your next dose, take only that dose. Do not take double or extra doses. What may interact with this medicine?  antihistamines for allergy, cough and cold  atropine  certain medicines for bladder problems like oxybutynin,  tolterodine  certain medicines for Parkinson's disease like benztropine, trihexyphenidyl  certain medicines for stomach problems like dicyclomine, hyoscyamine  glycopyrrolate  ipratropium  certain medicines for travel sickness like scopolamine  medicines that relax your muscles for surgery  other medicines for Alzheimer's disease This list may not describe all possible interactions. Give your health care provider a list of all the medicines, herbs, non-prescription drugs, or dietary supplements you use. Also tell them if you smoke, drink alcohol, or use illegal drugs. Some items may interact with your medicine. What should I watch for while using this medicine? Visit your doctor or health care professional for regular checks on your progress. Check with your doctor or health care professional if your symptoms do not get better or if they get worse. You may get drowsy or dizzy. Do not drive, use machinery, or do anything that needs mental alertness until you know how this drug affects you. What side effects may I notice from receiving this medicine? Side effects that you should report to your doctor or health care professional as soon as possible:  allergic reactions like skin rash, itching or hives, swelling of the face, lips, or tongue  changes in vision or balance  feeling faint or lightheaded, falls  increase in frequency of passing urine, or incontinence  nervousness, agitation, or increased confusion  redness, blistering, peeling or loosening of the skin, including inside the mouth  severe diarrhea  slow heartbeat, or palpitations  stomach pain  sweating  uncontrollable movements  vomiting  weight loss Side effects that usually  do not require medical attention (report to your doctor or health care professional if they continue or are bothersome):  headache  indigestion or heartburn  loss of appetite  mild diarrhea, especially when starting  treatment  nausea This list may not describe all possible side effects. Call your doctor for medical advice about side effects. You may report side effects to FDA at 1-800-FDA-1088. Where should I keep my medicine? Keep out of reach of children. Store at room temperature between 15 and 30 degrees C (59 and 86 degrees F). Keep container tightly closed. Throw away any unused medicine after the expiration date. NOTE: This sheet is a summary. It may not cover all possible information. If you have questions about this medicine, talk to your doctor, pharmacist, or health care provider.  2020 Elsevier/Gold Standard (2008-02-01 14:15:23)

## 2020-02-08 NOTE — Progress Notes (Signed)
Subjective:    Patient ID: Shirley Melendez, female    DOB: [birth year], 69 y.o.   MRN: [id number]  HPI The patient is here for follow up of their chronic medical problems, including htn, prediabetes, dementia.  She is here with her husband.  Her daughter mentioned she has a growth on her temple and wonders if this should be evaluated further. It has been there a long time.  Sometimes it gets larger and sometimes smaller.    She is following with neurology.   She is not exercising regularly.   She is eating well.      Medications and allergies reviewed with patient and updated if appropriate.  Patient Active Problem List   Diagnosis Date Noted  . Dementia without behavioral disturbance ([place]) 02/07/2020  . High serum vitamin B12 04/02/2018  . Neuropathy due to chemotherapeutic drug ([place]) 02/18/2018  . Prediabetes 12/11/2016  . Essential hypertension, benign 07/16/2013  . Lymphedema of upper extremity following lymphadenectomy 11/07/2011  . Cervicalgia 04/12/2009  . BACK PAIN, LUMBAR 04/12/2009  . Hyperlipidemia 08/24/2008  . Osteoarthritis 08/24/2008  . Osteopenia 08/24/2008  . hx: breast cancer, left, invasive ductal carcinoma with axillary mets, receptor + 08/24/2008    Current Outpatient Medications on File Prior to Visit  Medication Sig Dispense Refill  . anastrozole (ARIMIDEX) 1 MG tablet TAKE ONE TABLET BY MOUTH DAILY 90 tablet 3  . Calcium Carbonate-Vitamin D (CALCIUM 600 + D PO) Take 1 capsule by mouth 2 (two) times daily. 600/400    . DULoxetine (CYMBALTA) 60 MG capsule Take 1 capsule (60 mg total) by mouth daily. Due for follow up 30 capsule 0  . ibuprofen (ADVIL,MOTRIN) 200 MG tablet Take 200 mg by mouth every 6 (six) hours as needed.      Marland Kitchen LYSINE PO Take 500 mg by mouth as needed.      . memantine (NAMENDA) 10 MG tablet Take 1 tablet (10 mg total) by mouth 2 (two) times daily. 180 tablet 4  . metoprolol tartrate (LOPRESSOR) 50 MG tablet TAKE ONE TABLET BY MOUTH  TWICE A DAY 60 tablet 0  . Multiple Vitamins-Minerals (SENIOR MULTIVITAMIN PLUS PO) Take by mouth daily.      . NON FORMULARY Antifungal cream from [place]    . rivastigmine (EXELON) 1.5 MG capsule Take 1 capsule (1.5 mg total) by mouth 2 (two) times daily. 60 capsule 5  . spironolactone (ALDACTONE) 25 MG tablet TAKE ONE TABLET BY MOUTH DAILY 90 tablet 0  . Vitamin E 200 UNITS TABS Take 400 Units by mouth daily.      No current facility-administered medications on file prior to visit.    Past Medical History:  Diagnosis Date  . Arthritis    chronic neck and back pain  . Breast cancer ([place]) 1997   surgery, chemo, & radiation  . Breast cancer ([place]) 2012   surgery, chemo, & radiation with axillary dissection  . CTS (carpal tunnel syndrome) 2013   Dr [name] , Neurology  . Fasting hyperglycemia 2012   FBS 110  . Hyperlipidemia   . Hypertension   . 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 Memory loss   . MRSA (methicillin resistant staph aureus) culture positive 2012   swab was positive, treated per protocol.  No other issues.  . Osteopenia   . Personal history of chemotherapy 1997  . Personal history of radiation therapy 1997   x2  . PONV (  postoperative nausea and vomiting) 1997   no problems since    Past Surgical History:  Procedure Laterality Date  . AXILLARY NODE DISSECTION  08/2010   left  . AXILLARY NODE DISSECTION Left 2012   with axillary dissection on left  . BREAST BIOPSY  2012   x2  . BREAST LUMPECTOMY  09/1995, 10/1995   left - lumpectomy   . BREAST LUMPECTOMY  2012   Lumpectomy Axilla   . BREAST SURGERY  2012    axillary node dissection left  . CARPAL TUNNEL RELEASE  10/29/2011   Procedure: CARPAL TUNNEL RELEASE;  Surgeon: Cammie Sickle., MD;  Location: [facility];  Service: Orthopedics;  Laterality: Right;  . cataract surgery  Bilateral 2020  . COLONOSCOPY  2005   negative  .  DILATION AND CURETTAGE, DIAGNOSTIC / THERAPEUTIC  2002  . KNEE SURGERY  2000   left  . TRIGGER FINGER RELEASE  10/29/2011   Procedure: RELEASE TRIGGER FINGER/A-1 PULLEY;  Surgeon: Cammie Sickle., MD;  Location: [facility];  Service: Orthopedics;  Laterality: Right;  right long and right index     Social History   Socioeconomic History  . Marital status: Married    Spouse name: Not on file  . Number of children: 1  . Years of education: college  . Highest education level: Master's degree (e.g., MA, MS, MEng, MEd, MSW, MBA)  Occupational History  . Occupation: Retired  Tobacco Use  . Smoking status: Never Smoker  . Smokeless tobacco: Never Used  Vaping Use  . Vaping Use: Never used  Substance and Sexual Activity  . Alcohol use: No    Alcohol/week: 0.0 standard drinks  . Drug use: No  . Sexual activity: Not on file  Other Topics Concern  . Not on file  Social History Narrative   Retired Licensed conveyancer      Exercise: walking      Lives at home with her husband.   Right-handed.   2-3 cups coffee per day.   Social Determinants of Health   Financial Resource Strain:   . Difficulty of Paying Living Expenses: Not on file  Food Insecurity:   . Worried About Charity fundraiser in the Last Year: Not on file  . Ran Out of Food in the Last Year: Not on file  Transportation Needs:   . Lack of Transportation (Medical): Not on file  . Lack of Transportation (Non-Medical): Not on file  Physical Activity:   . Days of Exercise per Week: Not on file  . Minutes of Exercise per Session: Not on file  Stress:   . Feeling of Stress : Not on file  Social Connections:   . Frequency of Communication with Friends and Family: Not on file  . Frequency of Social Gatherings with Friends and Family: Not on file  . Attends Religious Services: Not on file  . Active Member of Clubs or Organizations: Not on file  . Attends Archivist Meetings: Not on file  . Marital  Status: Not on file    Family History  Problem Relation Age of Onset  . Hypertension Mother   . Arthritis Mother        OA  . Other Father        unknown medical history  . Arthritis Maternal Grandfather        OA  . Heart attack Maternal Grandfather 87  . Heart attack Maternal Uncle 65  . Stroke  Neg Hx   . Diabetes Neg Hx   . Breast cancer Neg Hx     Review of Systems  Constitutional: Negative for chills and fever.  HENT:       Throat clearing  Respiratory: Positive for shortness of breath (with humidity). Negative for cough and wheezing.   Cardiovascular: Negative for chest pain, palpitations and leg swelling.  Neurological: Negative for light-headedness and headaches.       Objective:   Vitals:   02/09/20 1110  BP: 126/78  Pulse: 84  Temp: 98.9 F (37.2 C)  SpO2: 95%   BP Readings from Last 3 Encounters:  02/09/20 126/78  02/07/20 120/79  08/19/19 127/76   Wt Readings from Last 3 Encounters:  02/09/20 108 lb (49 kg)  02/07/20 108 lb (49 kg)  08/19/19 110 lb (49.9 kg)   Body mass index is 20.41 kg/m.   Physical Exam    Constitutional: Appears well-developed and well-nourished. No distress.  HENT:  Head: Normocephalic and atraumatic.  Neck: Neck supple. No tracheal deviation present. No thyromegaly present.  No cervical lymphadenopathy Cardiovascular: Normal rate, regular rhythm and normal heart sounds.   No murmur heard. No carotid bruit .  No edema Pulmonary/Chest: Effort normal and breath sounds normal. No respiratory distress. No has no wheezes. No rales.  Skin: Skin is warm and dry. Not diaphoretic.  Psychiatric: Normal mood and affect. Behavior is normal.      Assessment & Plan:    Discussed getting tdap and flu vaccines   See Problem List for Assessment and Plan of chronic medical problems.    This visit occurred during the SARS-CoV-2 public health emergency.  Safety protocols were in place, including screening questions prior to the  visit, additional usage of staff PPE, and extensive cleaning of exam room while observing appropriate contact time as indicated for disinfecting solutions.

## 2020-02-09 ENCOUNTER — Ambulatory Visit: Payer: Medicare PPO | Admitting: Internal Medicine

## 2020-02-09 ENCOUNTER — Encounter: Payer: Self-pay | Admitting: Psychology

## 2020-02-09 ENCOUNTER — Encounter: Payer: Self-pay | Admitting: Internal Medicine

## 2020-02-09 ENCOUNTER — Other Ambulatory Visit: Payer: Self-pay

## 2020-02-09 VITALS — BP 126/78 | HR 84 | Temp 98.9°F | Ht 61.0 in | Wt 108.0 lb

## 2020-02-09 DIAGNOSIS — F0390 Unspecified dementia without behavioral disturbance: Secondary | ICD-10-CM

## 2020-02-09 DIAGNOSIS — T451X5A Adverse effect of antineoplastic and immunosuppressive drugs, initial encounter: Secondary | ICD-10-CM

## 2020-02-09 DIAGNOSIS — L989 Disorder of the skin and subcutaneous tissue, unspecified: Secondary | ICD-10-CM

## 2020-02-09 DIAGNOSIS — R7303 Prediabetes: Secondary | ICD-10-CM | POA: Diagnosis not present

## 2020-02-09 DIAGNOSIS — I10 Essential (primary) hypertension: Secondary | ICD-10-CM | POA: Diagnosis not present

## 2020-02-09 DIAGNOSIS — G620 Drug-induced polyneuropathy: Secondary | ICD-10-CM | POA: Diagnosis not present

## 2020-02-09 MED ORDER — METOPROLOL TARTRATE 50 MG PO TABS: 50.0000 mg | ORAL_TABLET | Freq: Two times a day (BID) | ORAL | 1 refills | Status: DC

## 2020-02-09 MED ORDER — SPIRONOLACTONE 25 MG PO TABS: 25.0000 mg | ORAL_TABLET | Freq: Every day | ORAL | 1 refills | Status: DC

## 2020-02-09 MED ORDER — DULOXETINE HCL 60 MG PO CPEP
60.0000 mg | ORAL_CAPSULE | Freq: Every day | ORAL | 1 refills | Status: DC
Start: 2020-02-09 — End: 2020-08-28

## 2020-02-09 NOTE — Assessment & Plan Note (Signed)
Chronic BP well controlled Current regimen effective and well tolerated Continue current medications at current doses cmp  

## 2020-02-09 NOTE — Assessment & Plan Note (Signed)
Chronic Following with neurology Currently on Namenda and to start Exelon today

## 2020-02-09 NOTE — Assessment & Plan Note (Signed)
Chronic Related to chemotherapy Located under left axilla Controlled with Cymbalta Continue Cymbalta 60 mg daily

## 2020-02-09 NOTE — Patient Instructions (Signed)
  Blood work was ordered.     Medications reviewed and updated.  Changes include :   none  Your prescription(s) have been submitted to your pharmacy. Please take as directed and contact our office if you believe you are having problem(s) with the medication(s).  A referral was ordered for dermatology.   Someone from their office will call you to schedule an appointment.    Please followup in 6 months

## 2020-02-09 NOTE — Assessment & Plan Note (Signed)
Chronic Check a1c Low sugar / carb diet Stressed regular exercise  

## 2020-02-10 LAB — COMPLETE METABOLIC PANEL WITH GFR
AG Ratio: 2.4 (calc) (ref 1.0–2.5)
ALT: 17 U/L (ref 6–29)
AST: 16 U/L (ref 10–35)
Albumin: 4.3 g/dL (ref 3.6–5.1)
Alkaline phosphatase (APISO): 72 U/L (ref 37–153)
BUN/Creatinine Ratio: 16 (calc) (ref 6–22)
BUN: 16 mg/dL (ref 7–25)
CO2: 30 mmol/L (ref 20–32)
Calcium: 9.7 mg/dL (ref 8.6–10.4)
Chloride: 99 mmol/L (ref 98–110)
Creat: 0.99 mg/dL — ABNORMAL HIGH (ref 0.60–0.93)
GFR, Est African American: 67 mL/min/{1.73_m2} (ref >=60)
GFR, Est Non African American: 58 mL/min/{1.73_m2} — ABNORMAL LOW (ref >=60)
Globulin: 1.8 g/dL (calc) — ABNORMAL LOW (ref 1.9–3.7)
Glucose, Bld: 108 mg/dL — ABNORMAL HIGH (ref 65–99)
Potassium: 4.8 mmol/L (ref 3.5–5.3)
Sodium: 136 mmol/L (ref 135–146)
Total Bilirubin: 0.9 mg/dL (ref 0.2–1.2)
Total Protein: 6.1 g/dL (ref 6.1–8.1)

## 2020-02-10 LAB — HEMOGLOBIN A1C
Hgb A1c MFr Bld: 5.7 %{Hb} — ABNORMAL HIGH
Mean Plasma Glucose: 117 (calc)
eAG (mmol/L): 6.5 (calc)

## 2020-02-22 ENCOUNTER — Telehealth: Payer: Self-pay | Admitting: Oncology

## 2020-02-22 ENCOUNTER — Other Ambulatory Visit: Payer: Self-pay

## 2020-02-22 ENCOUNTER — Inpatient Hospital Stay: Payer: Medicare PPO | Attending: Oncology | Admitting: Oncology

## 2020-02-22 VITALS — BP 122/78 | HR 87 | Temp 98.6°F | Resp 18 | Ht 61.0 in | Wt 109.1 lb

## 2020-02-22 DIAGNOSIS — C773 Secondary and unspecified malignant neoplasm of axilla and upper limb lymph nodes: Secondary | ICD-10-CM | POA: Insufficient documentation

## 2020-02-22 DIAGNOSIS — I10 Essential (primary) hypertension: Secondary | ICD-10-CM | POA: Diagnosis not present

## 2020-02-22 DIAGNOSIS — E538 Deficiency of other specified B group vitamins: Secondary | ICD-10-CM | POA: Diagnosis not present

## 2020-02-22 DIAGNOSIS — R609 Edema, unspecified: Secondary | ICD-10-CM | POA: Diagnosis not present

## 2020-02-22 DIAGNOSIS — F0390 Unspecified dementia without behavioral disturbance: Secondary | ICD-10-CM | POA: Insufficient documentation

## 2020-02-22 DIAGNOSIS — M8580 Other specified disorders of bone density and structure, unspecified site: Secondary | ICD-10-CM

## 2020-02-22 DIAGNOSIS — Z79811 Long term (current) use of aromatase inhibitors: Secondary | ICD-10-CM | POA: Diagnosis not present

## 2020-02-22 DIAGNOSIS — Z170 Estrogen receptor positive status [ER+]: Secondary | ICD-10-CM | POA: Diagnosis not present

## 2020-02-22 DIAGNOSIS — C50912 Malignant neoplasm of unspecified site of left female breast: Secondary | ICD-10-CM | POA: Insufficient documentation

## 2020-02-22 NOTE — Progress Notes (Signed)
[facility] OFFICE PROGRESS NOTE   Diagnosis: Breast cancer  INTERVAL HISTORY:   Ms. Wiehe returns as scheduled.  She last received Zometa in March.  She continues Arimidex.  She has been diagnosed with dementia.  Stable edema in the left arm.  She is no longer wearing a lymphedema sleeve.  Objective:  Vital signs in last 24 hours:  Blood pressure 122/78, pulse 87, temperature 98.6 F (37 C), temperature source Tympanic, resp. rate 18, height $RemoveBe'5\' 1"'mjcUnBEMZ$  (1.549 m), weight 109 lb 1.6 oz (49.5 kg), SpO2 95 %.    HEENT: Neck without mass Lymphatics: No cervical or supraclavicular nodes.  No left axillary nodes.  Pea-sized right axillary node-difficult to appreciate on exam today Resp: Lungs clear bilaterally Cardio: Regular rate and rhythm GI: No hepatosplenomegaly Vascular: No leg edema, mild edema of the left arm Breast: Status post left lumpectomy.  No evidence for local tumor recurrence.  No mass in either breast    Lab Results:  Lab Results  Component Value Date   WBC 8.8 08/19/2019   HGB 15.2 (H) 08/19/2019   HCT 44.2 08/19/2019   MCV 92.7 08/19/2019   PLT 296 08/19/2019   NEUTROABS 6.7 08/19/2019    CMP  Lab Results  Component Value Date   NA 136 02/09/2020   K 4.8 02/09/2020   CL 99 02/09/2020   CO2 30 02/09/2020   GLUCOSE 108 (H) 02/09/2020   BUN 16 02/09/2020   CREATININE 0.99 (H) 02/09/2020   CALCIUM 9.7 02/09/2020   PROT 6.1 02/09/2020   ALBUMIN 4.3 02/09/2019   AST 16 02/09/2020   ALT 17 02/09/2020   ALKPHOS 62 02/09/2019   BILITOT 0.9 02/09/2020   GFRNONAA 58 (L) 02/09/2020   GFRAA 67 02/09/2020    Medications: I have reviewed the patient's current medications.   Assessment/Plan:  1. Stage I (T1 NX) left-sided breast cancer diagnosed in April 1997. She was treated with a lumpectomy, left breast radiation, CMF chemotherapy, and 5 years of tamoxifen. 2. Metastatic carcinoma involving a left axillary lymph node, ER-positive,  PR-positive, and HER-2-negative. She is status post a biopsy of a left axillary lymph node 08/13/2010. Status post a left axillary lymph node dissection 09/13/2010 with the pathology confirming 3/11 axillary lymph nodes containing metastatic carcinoma. A staging PET scan on 08/23/2010 confirmed a hypermetabolic left axillary lymph node and no other evidence of metastatic disease.  1. Status post 4 cycles of "adjuvant" T-C chemotherapy with cycle #4 given on 01/07/2011. 2. She began Arimidex on 03/26/2011 3. Left arm lymphedema, likely lymphedema related to the left axillary lymph node dissection. She is now followed at the lymphedema clinic. Improved with physical therapy and a lymphedema sleeve. 4. Pain and numbness at the fingers bilaterally-right greater than left, she reports being diagnosed with carpal tunnel syndrome and underwent a right sided carpal tunnel surgery with clinical improvement  5. Osteopenia-she received Zometa 08/19/2019, bone density scan 02/09/2019-osteopenia  6. Hypertension 7.  Altered mental status 2019-undergoing evaluation by neurology, diagnosed with dementia 8.  Elevated vitamin B12 level 04/02/2018- nonspecific    Disposition: Ms. Plyler has a history of metastatic breast cancer with recurrence and a left axillary lymph node in 2012.  She remains in clinical remission.  She will continue Arimidex.  She will schedule a mammogram for next month.  Ms. Arnall will continue every 24-month follow-up at the Cancer center.  She will return for an office visit and Zometa in 6 months.  Betsy Coder, MD  02/22/2020  3:33 PM

## 2020-02-22 NOTE — Telephone Encounter (Signed)
Scheduled per 09/07 los, patient received updated calender.

## 2020-03-23 ENCOUNTER — Encounter: Payer: Medicare PPO | Admitting: Psychology

## 2020-03-24 ENCOUNTER — Other Ambulatory Visit: Payer: Self-pay

## 2020-03-24 ENCOUNTER — Ambulatory Visit (INDEPENDENT_AMBULATORY_CARE_PROVIDER_SITE_OTHER): Payer: Medicare PPO | Admitting: Psychology

## 2020-03-24 ENCOUNTER — Encounter: Payer: Self-pay | Admitting: Psychology

## 2020-03-24 ENCOUNTER — Ambulatory Visit: Payer: Medicare PPO | Admitting: Psychology

## 2020-03-24 DIAGNOSIS — F0390 Unspecified dementia without behavioral disturbance: Secondary | ICD-10-CM

## 2020-03-24 DIAGNOSIS — R4189 Other symptoms and signs involving cognitive functions and awareness: Secondary | ICD-10-CM

## 2020-03-24 NOTE — Progress Notes (Signed)
NEUROPSYCHOLOGICAL EVALUATION [facility] Department of Neurology  Date of Evaluation: March 24, 2020  Reason for Referral:   Shirley Melendez is a 69 y.o. right-handed Caucasian female referred by Butler Denmark, NP, to characterize her current cognitive functioning and assist with diagnostic clarity and treatment planning in the context of subjective cognitive dysfunction and concerns surrounding a major neurocognitive disorder.   Assessment and Plan:   Clinical Impression(s): Shirley Melendez pattern of performance is suggestive of primary impairments surrounding executive functioning, receptive language, and visuospatial abilities. She also performed in the well below average range across a task assessing safety and judgment. Additional performance variability was noted across processing speed, working memory, expressive language, and learning and memory. Basic attention and affect recognition were intact. Shirley Melendez and her daughter reported her exhibiting difficulties completing instrumental activities of daily living (ADLs), especially surrounding medication and financial management. She also does not drive due to cognitive concerns. This, coupled with evidence for significant cognitive dysfunction described above, suggests that she meets criteria for a Major Neurocognitive Disorder (formerly "dementia") at the present time.  Regarding etiology, I do have some concerns surrounding Lewy body dementia. Shirley Melendez exhibited her most prominent weaknesses during testing across aspects of executive functioning and visuospatial abilities, which is consistent with this clinical presentation. There are also concerns surrounding the presence of fully-formed visual hallucinations which have persisted despite medication cessation, considered one of the classic symptoms of this illness. While difficult to determine, Shirley Melendez also made a quick comment during interview that her  husband had previously mentioned being hit while asleep; instances of individuals acting out their dreams can be another classic symptom of this condition. Despite variability across memory, Shirley Melendez was able to retain and recognize previously learned information well when presented in a contextualized form (i.e., stories). This is inconsistent with the typical presentation of Alzheimer's disease. Behavioral symptoms are also not consistent with frontotemporal dementia and neuroimaging did not suggest concerns for a vascular dementia presentation. While there may be some concern that symptoms are directly related to previous medication side effects, it is more likely that her reaction to these medications brought previously unnoticed cognitive dysfunction to the surface. Continued medical monitoring will be important moving forward.  Recommendations: A repeat neuropsychological evaluation in 12-18 months is recommended to assess the trajectory of future cognitive decline should it occur. This will also aid in future efforts towards improved diagnostic clarity.  Ms. Demuro reported ongoing sensory deficits which should be addressed as soon as possible and certainly before any repeat testing is performed in the future. She is encouraged to see an optometrist/ophthalmologist to address report of blurred vision which appears to become progressively worse over time. Additionally, she should be referred to an audiologist to assess ongoing hearing loss and to prescribe hearing aids if necessary. Regarding the latter, recent research has shown a link between uncorrected hearing loss and dementia.   I agree with Shirley Melendez husband/family and would recommend that she fully abstain from driving based on the results of cognitive testing.    Should there be a progression of her current deficits over time, Shirley Melendez is unlikely to regain any independent living skills lost. Therefore, it is recommended that she  remain as involved as possible in all aspects of household chores, finances, and medication management, with supervision to ensure adequate performance. She will likely benefit from the establishment and maintenance of a routine in order to maximize her functional abilities over  time.  It will be important for Shirley Melendez to have another person with her when in situations where she may need to process information, weigh the pros and cons of different options, and make decisions, in order to ensure that she fully understands and recalls all information to be considered.  If not already done, Shirley Melendez and her family may want to discuss her wishes regarding durable power of attorney and medical decision making, so that she can have input into these choices. Additionally, they may wish to discuss future plans for caretaking and seek out community options for in home/residential care should they become necessary.  Shirley Melendez is encouraged to attend to lifestyle factors for brain health (e.g., regular physical exercise, good nutrition habits, regular participation in cognitively-stimulating activities, and general stress management techniques), which are likely to have benefits for both emotional adjustment and cognition. In fact, in addition to promoting good general health, regular exercise incorporating aerobic activities (e.g., brisk walking, jogging, cycling, etc.) has been demonstrated to be a very effective treatment for depression and stress, with similar efficacy rates to both antidepressant medication and psychotherapy. Optimal control of vascular risk factors (including safe cardiovascular exercise and adherence to dietary recommendations) is encouraged.   Because she shows better recall for structured information, she will likely understand and retain new information better if it is presented to her in a meaningful or well-organized manner at the outset, such as grouping items into meaningful  categories or presenting information in an outlined, bulleted, or story format.   To address problems with processing speed, she may wish to consider:   -Ensuring that she is alerted when essential material or instructions are being presented   -Adjusting the speed at which new information is presented   -Allowing for more time in comprehending, processing, and responding in conversation  To address problems with fluctuating attention, she may wish to consider:   -Avoiding external distractions when needing to concentrate   -Limiting exposure to fast paced environments with multiple sensory demands   -Writing down complicated information and using checklists   -Attempting and completing one task at a time (i.e., no multi-tasking)   -Verbalizing aloud each step of a task to maintain focus   -Reducing the amount of information considered at one time  Review of Records:   Shirley Melendez was seen by [facility] Butler Denmark, NP) on 02/07/2020 for an evaluation of memory loss. Briefly, she experienced a sudden onset memory change in May 2019. Due to symptoms of chronic insomnia, she was given a prescription of trazodone from May 2019 to June 2019. Unfortunately, this medication did not help her sleep much. Additionally, during that period of time, she began to report potential visual hallucinations (i.e., seeing people at her house). She also stated that she would wake up in the middle of the night confused, staring at her husband, and was unable to recognize him. Since trazodone was stopped, she remained symptomatic. For example, sometimes while eating breakfast, she will become confused about where she currently is and not recognize her husband. An additional example included her coming into her house one afternoon and seeing three different men in her home. Her husband tried to explain that this was likely him, but Shirley Melendez remained confused.  She was trialed on Aricept but reported  significant side effects surrounding GI symptoms, vivid nightmares, and was falling out of her bed.   During her 02/07/2020 appointment, Shirley Melendez was noted to be doing well on  Namenda. Her husband has taken over organizing her pillbox; she mostly remembers to take her medications as prescribed. She does her own basic ADLs (i.e., getting take out for dinner and having a cleaning lady come in). She is no longer driving; this was decided by her and her family to ensure safety. She denied ongoing hallucinations at that time. Her daughter theorized that hallucinations may actually be episodes where she cannot remember family members (i.e., the strange man she saw was actually her husband). Balance was described as good and she denied recent falls. Her and her daughter did note occasional groaning. This was said to occur while awake or asleep and was largely attributed to anxiety. Performance on a brief cognitive screening instrument (MMSE) was 22/30. This represents a decline from a previous 27/30 on 08/02/2019 and 30/30 on 03/04/2018. Ultimately, Shirley Melendez was referred for a comprehensive neuropsychological evaluation to characterize her cognitive abilities and to assist with diagnostic clarity and treatment planning.  Head CT on 02/23/2018 revealed mild small vessel ischemic changes. Brain MRI on 03/22/2018 mild generalized atrophy that had mildly progressed relative to a 04/09/2009 CT scan, as well as minimal chronic microvascular ischemic changes.   Past Medical History:  Diagnosis Date  . Arthritis    chronic neck and back pain  . Cervicalgia 04/12/2009   Gets massage once a month; related to arthritis    . CTS (carpal tunnel syndrome) 2013  . Essential hypertension, benign 07/16/2013  . Fasting hyperglycemia 2012   FBS 110  . High serum vitamin B12 04/02/2018  . History of breast cancer; left 1997 and 2012   Patient diagnosed with invasive ductal carcinoma on 10/14/95 after undergoing excision of  left breast mass. Patient then underwent left partial mastectomy on 10/30/95. Pathology showed two foci of invasive ductal carcinoma, grade 1. Patient then underwent a needle biopsy of a left axillary lymph node on 08/13/10 which showed metastatic carcinoma. She underwent left axillary node dissection on 09/13/10  . History of chemotherapy 1997  . History of radiation therapy 1997   x2  . Hyperlipidemia   . 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 Lower back pain 04/12/2009   Related to scoliosis  . Lymphedema of upper extremity following lymphadenectomy 11/07/2011  . Major neurocognitive disorder 02/07/2020   Unclear etiology  . MRSA (methicillin resistant staph aureus) culture positive 2012   swab was positive, treated per protocol.  No other issues.  . Neuropathy due to chemotherapeutic drug 02/18/2018  . Osteoarthritis 08/24/2008   Neck, back, knee    . Osteopenia   . PONV (postoperative nausea and vomiting) 1997   no problems since  . Prediabetes 12/11/2016    Past Surgical History:  Procedure Laterality Date  . AXILLARY NODE DISSECTION  08/2010   left  . AXILLARY NODE DISSECTION Left 2012   with axillary dissection on left  . BREAST BIOPSY  2012   x2  . BREAST LUMPECTOMY  09/1995, 10/1995   left - lumpectomy   . BREAST LUMPECTOMY  2012   Lumpectomy Axilla   . BREAST SURGERY  2012    axillary node dissection left  . CARPAL TUNNEL RELEASE  10/29/2011   Procedure: CARPAL TUNNEL RELEASE;  Surgeon: Cammie Sickle., MD;  Location: [facility];  Service: Orthopedics;  Laterality: Right;  . cataract surgery  Bilateral 2020  . COLONOSCOPY  2005   negative  . DILATION AND CURETTAGE, DIAGNOSTIC / THERAPEUTIC  2002  . KNEE SURGERY  2000   left  . TRIGGER FINGER RELEASE  10/29/2011   Procedure: RELEASE TRIGGER FINGER/A-1 PULLEY;  Surgeon: Cammie Sickle., MD;  Location: [facility];  Service:  Orthopedics;  Laterality: Right;  right long and right index     Current Outpatient Medications:  .  anastrozole (ARIMIDEX) 1 MG tablet, TAKE ONE TABLET BY MOUTH DAILY, Disp: 90 tablet, Rfl: 3 .  Calcium Carbonate-Vitamin D (CALCIUM 600 + D PO), Take 1 capsule by mouth 2 (two) times daily. 600/400, Disp: , Rfl:  .  DULoxetine (CYMBALTA) 60 MG capsule, Take 1 capsule (60 mg total) by mouth daily., Disp: 90 capsule, Rfl: 1 .  ibuprofen (ADVIL,MOTRIN) 200 MG tablet, Take 200 mg by mouth every 6 (six) hours as needed.  , Disp: , Rfl:  .  LYSINE PO, Take 500 mg by mouth as needed.  , Disp: , Rfl:  .  memantine (NAMENDA) 10 MG tablet, Take 1 tablet (10 mg total) by mouth 2 (two) times daily., Disp: 180 tablet, Rfl: 4 .  metoprolol tartrate (LOPRESSOR) 50 MG tablet, Take 1 tablet (50 mg total) by mouth 2 (two) times daily., Disp: 180 tablet, Rfl: 1 .  Multiple Vitamins-Minerals (SENIOR MULTIVITAMIN PLUS PO), Take by mouth daily.  , Disp: , Rfl:  .  NON FORMULARY, Antifungal cream from [place], Disp: , Rfl:  .  rivastigmine (EXELON) 1.5 MG capsule, Take 1 capsule (1.5 mg total) by mouth 2 (two) times daily., Disp: 60 capsule, Rfl: 5 .  spironolactone (ALDACTONE) 25 MG tablet, Take 1 tablet (25 mg total) by mouth daily., Disp: 90 tablet, Rfl: 1 .  Vitamin E 200 UNITS TABS, Take 400 Units by mouth daily. , Disp: , Rfl:   Clinical Interview:   The following information was obtained during a clinical interview with Shirley Melendez prior to cognitive testing.  Cognitive Symptoms: Decreased short-term memory: Endorsed. She reported generalized forgetfulness, as well as trouble remembering the names and faces of familiar individuals. She further noted misplacing things around her residence frequently. Her daughter emphasized difficulties recognizing familiar individuals, especially her husband. She also noted that Shirley Melendez appears to occasionally exhibit some confusion surrounding if she is truly in  her home or if she needs to leave and return home during the evenings.  Decreased long-term memory: Denied. Decreased attention/concentration: Endorsed. She reported mild difficulties with sustained attention, generally attributed to symptoms of fatigue. She also alluded to some trouble with distractibility. Her daughter noted that if she gets interrupted or distracted, she may lose her train of thought.  Reduced processing speed: Endorsed "a little bit."  Difficulties with executive functions: Endorsed. She reported difficulties with organization, largely attributed to trouble misplacing things around her home. This was said to represent a big change as she was previously a very organized individual. She noted some trouble with indecision, stating that it takes her longer to arrive at certain decision. Trouble with judgment or impulsivity was denied. No overt personality changes were reported.  Difficulties with emotion regulation: Denied. Difficulties with receptive language: Denied assuming she can hear the source of the sound adequately.  Difficulties with word finding: Endorsed. Decreased visuoperceptual ability: Denied.  Trajectory of deficits: Deficits were said to have a relatively quick onset in early 2019. Symptoms largely presented after having a bad reaction to trazodone, which prompted the emergence of potential visual hallucinations. Symptoms have persisted and have gradually worsened over time. Her daughter theorized that deficits  may have been present prior to medication effects experienced due to trazodone, but that these side effects brought deficits more to the visible surface.   Difficulties completing ADLs: Endorsed. Her husband manages her medications and fills her pillbox. Her daughter expressed her belief that Ms. Depace would have significant difficulties performing this task independently. She is generally able to take her medications as prescribed. Her husband manages their  finances; this appeared to be a longstanding pattern. She does not drive due to ongoing visual acuity concerns, as well as concerns surrounding her safety and potential to become disoriented and get lost.   Additional Medical History: History of traumatic brain injury/concussion: Denied. History of stroke: Denied. History of seizure activity: Denied. History of known exposure to toxins: Denied. Symptoms of chronic pain: Endorsed. She reported lower back pain. Symptoms were described as generally managed well with OTC medication.  Experience of frequent headaches/migraines: Denied. Frequent instances of dizziness/vertigo: Denied.  Sensory changes: She reported a prior history of cataract surgery. She also noted ongoing visual acuity decline in that she will experience progressive blurred vision and eye fatigue. She further reported mild hearing loss (no hearing aid intervention) and a longstanding loss in her sense of smell (20+ years prior). Taste was said to be mildly impacted by her loss of smell.  Balance/coordination difficulties: Denied. She also denied a history of falls.  Other motor difficulties: Denied outside of occasionally experience tremors during periods of anxiety.   Other medical conditions: Regarding her history of breast cancer, Ms. Stetzer was diagnosed with stage I left-sided breast cancer in April 1997. She was treated with lobectomy radiation, CMF chemotherapy, and five years of tamoxifen. She also had a metastatic carcinoma involving the left axillary lymph node in 2012. She underwent a left axillary lymph node dissection on September 13, 2010 and completed 4 cycle adjuvant TC chemotherapy on January 07, 2011. She began Arimidex in December 2012 and has experienced left arm lymphedema since.  Sleep History: Estimated hours obtained each night: 5-6 hours. Difficulties falling asleep: Denied. Difficulties staying asleep: Denied. She reported waking up 1-2 times throughout the night  but is generally able to fall back asleep quickly.  Feels rested and refreshed upon awakening: Variably so.   History of snoring: Denied. History of waking up gasping for air: Denied. Witnessed breath cessation while asleep: Denied.  History of vivid dreaming: Denied outside of medication side effects (Aricept).  Excessive movement while asleep: Denied. Instances of acting out her dreams: Denied outside of medication side effects (Aricept). Her daughter did note that Ms. Counihan will occasionally be found moaning after she first wakes up. This was generally attributed to anxiety. There was also a very brief mention by Ms. Dokes that her husband had made a comment that he was being hit while asleep. However, this was also generally attributed to medication side effects.   Psychiatric/Behavioral Health History: Depression: Denied. She described her current mood as "pretty good" and denied a history of previous depressive symptoms. Current or remote suicidal ideation, intent, or plan was denied.  Anxiety: Endorsed. She reported generally mild symptoms of anxiety, largely surrounding concerns surrounding cognitive decline, as well as surrounding various psychosocial stressors in her day-to-day life.  Mania: Denied. Trauma History: Denied. Visual/auditory hallucinations: Endorsed. As described above, there have been concerns surrounding fully-formed visual hallucinations in that Ms. Guggenheim has reported seeing up to three men in or around her home. This was said to first start after having a bad reaction to trazodone in  2019. However, symptoms have persisted even since being taken off this medication. Her daughter stated that they were overall unclear if these truly represented hallucinations or if the men she was seeing were actually her husband whom she did not recognize. Despite this, Ms. Rader did report sometimes seeing things that other people did not see.  Delusional thoughts:  Denied.  Tobacco: Denied. Alcohol: She denied current alcohol consumption as well as a history of problematic alcohol abuse or dependence.  Recreational drugs: Denied. Caffeine: 1-2 cups of coffee in the morning.   Family History: Problem Relation Age of Onset  . Hypertension Mother   . Arthritis Mother        OA  . Dementia Mother   . Other Father        unknown medical history  . Arthritis Maternal Grandfather        OA  . Heart attack Maternal Grandfather 87  . Dementia Maternal Grandfather   . Heart attack Maternal Uncle 65  . Dementia Maternal Grandmother   . Stroke Neg Hx   . Diabetes Neg Hx   . Breast cancer Neg Hx    This information was confirmed by Ms. Morrill.  Academic/Vocational History: Highest level of educational attainment: 18 years. She graduated from high school, completed her Bachelor's degree, and earned a Brewing technologist degree in [place] and music. She described herself as a good (A/B) student in academic settings. Science-based classes were said to represent a potential relative weakness.  History of developmental delay: Denied. History of grade repetition: Denied. Enrollment in special education courses: Denied. History of LD/ADHD: Denied.  Employment: Retired. 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   Evaluation Results:   Behavioral Observations: Ms. Gaber was accompanied by her daughter, arrived to her appointment on time, and was appropriately dressed and groomed. She appeared alert and oriented. Observed gait and station were within normal limits. Gross motor functioning appeared intact upon informal observation and no abnormal movements (e.g., tremors) were noted. Her affect was generally relaxed and positive, but did range appropriately given the subject being discussed during the clinical interview or the task at hand during testing procedures. Mild hearing loss was exhibited throughout the interview. This was also prevalent during  testing and she required instructions to be repeated to ensure adequate comprehension. Spontaneous speech was fluent and word finding difficulties were not observed during the clinical interview. Thought processes were coherent, organized, and normal in content. Insight into her cognitive difficulties appeared adequate. During testing, sustained attention was appropriate. Task engagement was adequate and she persisted when challenged. Overall, Ms. Goulart was cooperative with the clinical interview and subsequent testing procedures.   Adequacy of Effort: The validity of neuropsychological testing is limited by the extent to which the individual being tested may be assumed to have exerted adequate effort during testing. Ms. Bamburg expressed her intention to perform to the best of her abilities and exhibited adequate task engagement and persistence. Scores across stand-alone and embedded performance validity measures were variable but largely within expectation. One below expectation performance is believed to be due to true cognitive dysfunction rather than poor engagement or attempts to perform poorly across testing. As such, the results of the current evaluation are believed to be a valid representation of Ms. Mullings's current cognitive functioning.  Test Results: Ms. Behan was somewhat disoriented at the time of the current evaluation. She incorrectly stated her current age ("80"). She also was unable to estimate the current time of day.  Intellectual abilities based upon educational and vocational attainment were estimated to be in the average to above average range. Premorbid abilities were estimated to be within the average range based upon a single-word reading test.   Processing speed was variable, ranging from the exceptionally low to average normative ranges. Basic attention was average. More complex attention (e.g., working memory) was well below average to below average. Executive  functioning was exceptionally low. Performance across a task assessing safety and judgment was well below average.  Assessed receptive language abilities were exceptionally low. She demonstrated some trouble using reasoning abilities when responding to questions, as well as some trouble comprehending more complex sentence structure. Assessed expressive language was variable. Phonemic fluency was below average, semantic fluency was exceptionally low, and confrontation naming was exceptionally low to average.     Assessed visuospatial/visuoconstructional abilities were exceptionally low and represented a primary area of weakness across the current evaluation. Points were lost on her copy of a clock due to poor numerical arrangement (she started well with the number 12 in its correct location; however, she ended with the number 11 appearing where the number 8 would normally be located). She also lost points for placing the numbers outside of the circle and was only able to draw one hand, which was placed in an incorrect location. On her copy of a complex figure, points were lost more for the omission of several internal and external aspects of the larger figure rather than visual distortions or spacing issues.    Learning (i.e., encoding) of novel verbal information was exceptionally low across a list learning task but below average on a story learning task. Spontaneous delayed recall (i.e., retrieval) of previously learned information was exceptionally low to below average, with a strength again noted across a story learning task. Retention rates were 75% across a story learning task, 0% across a list learning task, and 29% across a complex figure drawing task. Performance across recognition tasks was variable overall, suggesting some evidence for information consolidation. She performed well across a story task and despite her normative performance falling in the exceptionally low range across a list learning  task, she still correctly identified 10/12 words with only two false positive errors.   Results of emotional screening instruments suggested that recent symptoms of generalized anxiety were in the mild range, while symptoms of depression were also within the mild range. A screening instrument assessing recent sleep quality suggested the presence of minimal sleep dysfunction.  Tables of Scores:   Note: This summary of test scores accompanies the interpretive report and should not be considered in isolation without reference to the appropriate sections in the text. Descriptors are based on appropriate normative data and may be adjusted based on clinical judgment. The terms "impaired" and "within normal limits (WNL)" are used when a more specific level of functioning cannot be determined.       Effort Testing:   DESCRIPTOR       ACS Word Choice: --- --- Below Expectation  Dot Counting Test: --- --- Within Expectation  RBANS Effort Index: --- --- Within Expectation  WAIS-IV Reliable Digit Span: --- --- Within Expectation  D-KEFS Color Word Effort Index: --- --- Within Expectation       Orientation:      Raw Score Percentile   NAB Orientation, Form 1 25/29 --- ---       Cognitive Screening:           Raw Score Percentile   SLUMS: 12/30 --- ---  RBANS, Form A: Standard Score/ Scaled Score Percentile   Total Score 64 1 Exceptionally Low  Immediate Memory 69 2 Exceptionally Low    List Learning 2 <1 Exceptionally Low    Story Memory 7 16 Below Average  Visuospatial/Constructional 66 1 Exceptionally Low    Figure Copy 4 2 Well Below Average    Line Orientation 11/20 3-9 Well Below Average  Language 82 12 Below Average    Picture Naming 9/10 26-50 Average    Semantic Fluency 3 1 Exceptionally Low  Attention 82 12 Below Average    Digit Span 10 50 Average    Coding 4 2 Well Below Average  Delayed Memory 60 <1 Exceptionally Low    List Recall 0/10 <2 Exceptionally Low    List  Recognition 16/20 <2 Exceptionally Low    Story Recall 7 16 Below Average    Story Recognition 9/12 16-26 Below Average    Figure Recall 4 2 Well Below Average    Figure Recognition 3/8 4-8 Well Below Average       Intellectual Functioning:           Standard Score Percentile   Test of Premorbid Functioning: 107 68 Average       Attention/Executive Function:          Trail Making Test (TMT): Raw Score (T Score) Percentile     Part A 81 secs.,  0 errors (19) <1 Exceptionally Low    Part B 277 secs.,  2 errors (21) <1 Exceptionally Low         Scaled Score Percentile   WAIS-IV Digit Span: 4 2 Well Below Average    Forward 8 25 Average    Backward 6 9 Below Average    Sequencing 4 2 Well Below Average        Scaled Score Percentile   WAIS-IV Similarities: 6 9 Below Average       D-KEFS Color-Word Interference Test: Raw Score (Scaled Score) Percentile     Color Naming 39 secs. (8) 25 Average    Word Reading 27 secs. (9) 37 Average    Inhibition 180 secs. (1) <1 Exceptionally Low      Total Errors 7 errors (6) 9 Below Average    Inhibition/Switching Discontinued --- Impaired      Total Errors --- --- ---       D-KEFS Verbal Fluency Test: Raw Score (Scaled Score) Percentile     Letter Total Correct 29 (8) 25 Average    Category Total Correct 14 (2) <1 Exceptionally Low    Category Switching Total Correct 5 (2) <1 Exceptionally Low    Category Switching Accuracy 1 (1) <1 Exceptionally Low      Total Set Loss Errors 6 (5) 5 Well Below Average      Total Repetition Errors 2 (11) 63 Average       NAB Executive Functions Module, Form 1: T Score Percentile     Judgment 31 3 Well Below Average       Language:          Verbal Fluency Test: Raw Score (T Score) Percentile     Phonemic Fluency (FAS) 29 (37) 9 Below Average    Animal Fluency 8 (18) <1 Exceptionally Low        NAB Language Module, Form 1: T Score Percentile     Auditory Comprehension 19 <1 Exceptionally Low     Naming 26/31 (28) 2 Exceptionally Low  Visuospatial/Visuoconstruction:      Raw Score Percentile   Clock Drawing: 5/10 --- Impaired       NAB Spatial Module, Form 1: T Score Percentile     Visual Discrimination 29 2 Exceptionally Low        Scaled Score Percentile   WAIS-IV Block Design: 2 <1 Exceptionally Low       Social Perception:          ACS Social Cognition: Scaled Score Percentile     Affect Naming 10 50 Average       Mood and Personality:      Raw Score Percentile   Geriatric Depression Scale: 10 --- Mild  Geriatric Anxiety Scale: 12 --- Mild    Somatic 6 --- Mild    Cognitive 3 --- Mild    Affective 3 --- Minimal       Additional Questionnaires:      Raw Score Percentile   PROMIS Sleep Disturbance Questionnaire: 14 --- None to Slight   Informed Consent and Coding/Compliance:   Ms. Gipson was provided with a verbal description of the nature and purpose of the present neuropsychological evaluation. Also reviewed were the foreseeable risks and/or discomforts and benefits of the procedure, limits of confidentiality, and mandatory reporting requirements of this provider. The patient was given the opportunity to ask questions and receive answers about the evaluation. Oral consent to participate was provided by the patient.   This evaluation was conducted by Christia Reading, Ph.D., licensed clinical neuropsychologist. Ms. Heuerman completed a comprehensive clinical interview with Dr. Melvyn Novas, billed as one unit [phone], and 160 minutes of cognitive testing and scoring, billed as one unit [phone] and four additional units 96139. Psychometrist Milana Kidney, B.S., assisted Dr. Melvyn Novas with test administration and scoring procedures. As a separate and discrete service, Dr. Melvyn Novas spent a total of 160 minutes in interpretation and report writing billed as one unit [phone] and two units 96133.

## 2020-03-24 NOTE — Progress Notes (Signed)
   Psychometrician Note   Cognitive testing was administered to [name] by Milana Kidney, B.S. (psychometrist) under the supervision of Dr. Christia Reading, Ph.D., licensed psychologist on 03/24/20. Ms. Gaertner did not appear overtly distressed by the testing session per behavioral observation or responses across self-report questionnaires. Dr. Christia Reading, Ph.D. checked in with Shirley Melendez as needed to manage any distress related to testing procedures (if applicable). Rest breaks were offered.    The battery of tests administered was selected by Dr. Christia Reading, Ph.D. with consideration to Shirley Melendez's current level of functioning, the nature of her symptoms, emotional and behavioral responses during interview, level of literacy, observed level of motivation/effort, and the nature of the referral question. This battery was communicated to the psychometrist. Communication between Dr. Christia Reading, Ph.D. and the psychometrist was ongoing throughout the evaluation and Dr. Christia Reading, Ph.D. was immediately accessible at all times. Dr. Christia Reading, Ph.D. provided supervision to the psychometrist on the date of this service to the extent necessary to assure the quality of all services provided.    Shirley Melendez will return within approximately 1-2 weeks for an interactive feedback session with Dr. Melvyn Novas at which time her test performances, clinical impressions, and treatment recommendations will be reviewed in detail. Shirley Melendez understands she can contact our office should she require our assistance before this time.  A total of 160 minutes of billable time were spent face-to-face with Shirley Melendez by the psychometrist. This includes both test administration and scoring time. Billing for these services is reflected in the clinical report generated by Dr. Christia Reading, Ph.D..  This note reflects time spent with the psychometrician and does not include test scores or any  clinical interpretations made by Dr. Melvyn Novas. The full report will follow in a separate note.

## 2020-03-30 ENCOUNTER — Encounter: Payer: Medicare PPO | Admitting: Psychology

## 2020-03-31 ENCOUNTER — Ambulatory Visit (INDEPENDENT_AMBULATORY_CARE_PROVIDER_SITE_OTHER): Payer: Medicare PPO | Admitting: Psychology

## 2020-03-31 ENCOUNTER — Other Ambulatory Visit: Payer: Self-pay

## 2020-03-31 DIAGNOSIS — F0390 Unspecified dementia without behavioral disturbance: Secondary | ICD-10-CM | POA: Diagnosis not present

## 2020-03-31 NOTE — Patient Instructions (Signed)
A repeat neuropsychological evaluation in 12-18 months is recommended to assess the trajectory of future cognitive decline should it occur. This will also aid in future efforts towards improved diagnostic clarity.  Shirley Melendez reported ongoing sensory deficits which should be addressed as soon as possible and certainly before any repeat testing is performed in the future. She is encouraged to see an optometrist/ophthalmologist to address report of blurred vision which appears to become progressively worse over time. Additionally, she should be referred to an audiologist to assess ongoing hearing loss and to prescribe hearing aids if necessary. Regarding the latter, recent research has shown a link between uncorrected hearing loss and dementia.   I agree with Shirley Melendez husband/family and would recommend that she fully abstain from driving based on the results of cognitive testing.    Should there be a progression of her current deficits over time, Shirley Melendez is unlikely to regain any independent living skills lost. Therefore, it is recommended that she remain as involved as possible in all aspects of household chores, finances, and medication management, with supervision to ensure adequate performance. She will likely benefit from the establishment and maintenance of a routine in order to maximize her functional abilities over time.  It will be important for Shirley Melendez to have another person with her when in situations where she may need to process information, weigh the pros and cons of different options, and make decisions, in order to ensure that she fully understands and recalls all information to be considered.  If not already done, Shirley Melendez and her family may want to discuss her wishes regarding durable power of attorney and medical decision making, so that she can have input into these choices. Additionally, they may wish to discuss future plans for caretaking and seek out community  options for in home/residential care should they become necessary.  Shirley Melendez is encouraged to attend to lifestyle factors for brain health (e.g., regular physical exercise, good nutrition habits, regular participation in cognitively-stimulating activities, and general stress management techniques), which are likely to have benefits for both emotional adjustment and cognition. In fact, in addition to promoting good general health, regular exercise incorporating aerobic activities (e.g., brisk walking, jogging, cycling, etc.) has been demonstrated to be a very effective treatment for depression and stress, with similar efficacy rates to both antidepressant medication and psychotherapy. Optimal control of vascular risk factors (including safe cardiovascular exercise and adherence to dietary recommendations) is encouraged.   Because she shows better recall for structured information, she will likely understand and retain new information better if it is presented to her in a meaningful or well-organized manner at the outset, such as grouping items into meaningful categories or presenting information in an outlined, bulleted, or story format.   To address problems with processing speed, she may wish to consider:   -Ensuring that she is alerted when essential material or instructions are being presented   -Adjusting the speed at which new information is presented   -Allowing for more time in comprehending, processing, and responding in conversation  To address problems with fluctuating attention, she may wish to consider:   -Avoiding external distractions when needing to concentrate   -Limiting exposure to fast paced environments with multiple sensory demands   -Writing down complicated information and using checklists   -Attempting and completing one task at a time (i.e., no multi-tasking)   -Verbalizing aloud each step of a task to maintain focus   -Reducing the amount of information considered at  one time
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
## 2020-04-10 DIAGNOSIS — Z961 Presence of intraocular lens: Secondary | ICD-10-CM | POA: Diagnosis not present

## 2020-04-10 DIAGNOSIS — H5203 Hypermetropia, bilateral: Secondary | ICD-10-CM | POA: Diagnosis not present

## 2020-04-10 DIAGNOSIS — H524 Presbyopia: Secondary | ICD-10-CM | POA: Diagnosis not present

## 2020-04-10 DIAGNOSIS — H52223 Regular astigmatism, bilateral: Secondary | ICD-10-CM | POA: Diagnosis not present

## 2020-04-10 DIAGNOSIS — H04129 Dry eye syndrome of unspecified lacrimal gland: Secondary | ICD-10-CM | POA: Diagnosis not present

## 2020-04-12 ENCOUNTER — Telehealth: Payer: Self-pay | Admitting: Neurology

## 2020-04-12 NOTE — Telephone Encounter (Signed)
I called the patient.  She had neuropsychological evaluation, was concerning for Lewy body dementia, but does not fit the presentation perfectly, but is felt to be a better fit than AD or FTD.   I called the patient, she is coming back to see Dr. [name] in December for follow-up.  She is on Namenda and Exelon, seems to be doing well.  Denies any hallucinations.  Recommend repeat neuropsychological evaluation in 12 to 18 months.

## 2020-05-12 ENCOUNTER — Encounter: Payer: Self-pay | Admitting: Internal Medicine

## 2020-05-12 DIAGNOSIS — I10 Essential (primary) hypertension: Secondary | ICD-10-CM

## 2020-05-12 MED ORDER — SPIRONOLACTONE 25 MG PO TABS: 25.0000 mg | ORAL_TABLET | Freq: Every day | ORAL | 1 refills | Status: DC

## 2020-05-23 ENCOUNTER — Ambulatory Visit: Payer: Medicare PPO | Admitting: Neurology

## 2020-05-29 ENCOUNTER — Ambulatory Visit: Payer: Medicare PPO | Admitting: Neurology

## 2020-08-06 ENCOUNTER — Encounter: Payer: Self-pay | Admitting: Oncology

## 2020-08-15 ENCOUNTER — Other Ambulatory Visit: Payer: Self-pay | Admitting: Neurology

## 2020-08-21 ENCOUNTER — Other Ambulatory Visit: Payer: Medicare PPO

## 2020-08-21 ENCOUNTER — Ambulatory Visit: Payer: Medicare PPO | Admitting: Oncology

## 2020-08-21 ENCOUNTER — Ambulatory Visit: Payer: Medicare PPO

## 2020-08-27 ENCOUNTER — Other Ambulatory Visit: Payer: Self-pay | Admitting: Internal Medicine

## 2020-08-28 ENCOUNTER — Inpatient Hospital Stay: Payer: Medicare PPO

## 2020-08-28 ENCOUNTER — Other Ambulatory Visit: Payer: Self-pay

## 2020-08-28 ENCOUNTER — Inpatient Hospital Stay: Payer: Medicare PPO | Attending: Oncology

## 2020-08-28 ENCOUNTER — Inpatient Hospital Stay: Payer: Medicare PPO | Admitting: Oncology

## 2020-08-28 VITALS — BP 119/75 | HR 74 | Temp 97.9°F | Resp 20 | Ht 61.0 in | Wt 109.2 lb

## 2020-08-28 DIAGNOSIS — F0390 Unspecified dementia without behavioral disturbance: Secondary | ICD-10-CM | POA: Diagnosis not present

## 2020-08-28 DIAGNOSIS — Z9221 Personal history of antineoplastic chemotherapy: Secondary | ICD-10-CM | POA: Diagnosis not present

## 2020-08-28 DIAGNOSIS — M8580 Other specified disorders of bone density and structure, unspecified site: Secondary | ICD-10-CM | POA: Diagnosis not present

## 2020-08-28 DIAGNOSIS — M79644 Pain in right finger(s): Secondary | ICD-10-CM | POA: Insufficient documentation

## 2020-08-28 DIAGNOSIS — Z853 Personal history of malignant neoplasm of breast: Secondary | ICD-10-CM

## 2020-08-28 DIAGNOSIS — M79645 Pain in left finger(s): Secondary | ICD-10-CM | POA: Insufficient documentation

## 2020-08-28 DIAGNOSIS — C50912 Malignant neoplasm of unspecified site of left female breast: Secondary | ICD-10-CM | POA: Insufficient documentation

## 2020-08-28 DIAGNOSIS — Z79811 Long term (current) use of aromatase inhibitors: Secondary | ICD-10-CM | POA: Insufficient documentation

## 2020-08-28 DIAGNOSIS — Z170 Estrogen receptor positive status [ER+]: Secondary | ICD-10-CM | POA: Insufficient documentation

## 2020-08-28 DIAGNOSIS — G5603 Carpal tunnel syndrome, bilateral upper limbs: Secondary | ICD-10-CM | POA: Diagnosis not present

## 2020-08-28 DIAGNOSIS — I10 Essential (primary) hypertension: Secondary | ICD-10-CM | POA: Insufficient documentation

## 2020-08-28 DIAGNOSIS — Z79899 Other long term (current) drug therapy: Secondary | ICD-10-CM | POA: Insufficient documentation

## 2020-08-28 DIAGNOSIS — I890 Lymphedema, not elsewhere classified: Secondary | ICD-10-CM | POA: Insufficient documentation

## 2020-08-28 LAB — BASIC METABOLIC PANEL - CANCER CENTER ONLY
Anion gap: 6 (ref 5–15)
BUN: 18 mg/dL (ref 8–23)
CO2: 25 mmol/L (ref 22–32)
Calcium: 9.4 mg/dL (ref 8.9–10.3)
Chloride: 102 mmol/L (ref 98–111)
Creatinine: 1.06 mg/dL — ABNORMAL HIGH (ref 0.44–1.00)
GFR, Estimated: 57 mL/min — ABNORMAL LOW (ref >60)
Glucose, Bld: 110 mg/dL — ABNORMAL HIGH (ref 70–99)
Potassium: 4.5 mmol/L (ref 3.5–5.1)
Sodium: 133 mmol/L — ABNORMAL LOW (ref 135–145)

## 2020-08-28 MED ORDER — ZOLEDRONIC ACID 4 MG/100ML IV SOLN
INTRAVENOUS | Status: AC
  Filled 2020-08-28: qty 100

## 2020-08-28 MED ORDER — ZOLEDRONIC ACID 4 MG/100ML IV SOLN: 4.0000 mg | Freq: Once | INTRAVENOUS | Status: DC

## 2020-08-28 MED ORDER — SODIUM CHLORIDE 0.9 % IV SOLN
INTRAVENOUS | Status: DC
  Filled 2020-08-28: qty 250

## 2020-08-28 MED ORDER — ZOLEDRONIC ACID 4 MG/100ML IV SOLN
4.0000 mg | Freq: Once | INTRAVENOUS | Status: AC
  Administered 2020-08-28: 4 mg via INTRAVENOUS

## 2020-08-28 NOTE — Progress Notes (Signed)
[place] OFFICE PROGRESS NOTE   Diagnosis: Breast cancer  INTERVAL HISTORY:   Shirley Melendez returns as scheduled.  She is here with her daughter.  Shirley Melendez has developed significant dementia.  She no longer drives.  She has persistent edema of the left arm.She continues Arimidex.  She has developed a dry erythematous rash at the forehead.  Objective:  Vital signs in last 24 hours:  Blood pressure 119/75, pulse 74, temperature 97.9 F (36.6 C), temperature source Tympanic, resp. rate 20, height $RemoveBe'5\' 1"'[id number]$  (1.549 m), weight 109 lb 3.2 oz (49.5 kg), SpO2 96 %.    Lymphatics: No cervical, supraclavicular, or left axillary nodes.  "Shotty "right axillary nodes Resp: Lungs clear bilaterally Cardio: Regular rate and rhythm GI: No hepatomegaly Vascular: No leg edema, edema throughout the left arm  Skin: Dry erythematous rash at the forehead Breast: No mass in either breast, status post left lumpectomy.  No evidence for local tumor recurrence    Lab Results:  Lab Results  Component Value Date   WBC 8.8 08/19/2019   HGB 15.2 (H) 08/19/2019   HCT 44.2 08/19/2019   MCV 92.7 08/19/2019   PLT 296 08/19/2019   NEUTROABS 6.7 08/19/2019    CMP  Lab Results  Component Value Date   NA 136 02/09/2020   K 4.8 02/09/2020   CL 99 02/09/2020   CO2 30 02/09/2020   GLUCOSE 108 (H) 02/09/2020   BUN 16 02/09/2020   CREATININE 0.99 (H) 02/09/2020   CALCIUM 9.7 02/09/2020   PROT 6.1 02/09/2020   ALBUMIN 4.3 02/09/2019   AST 16 02/09/2020   ALT 17 02/09/2020   ALKPHOS 62 02/09/2019   BILITOT 0.9 02/09/2020   GFRNONAA 58 (L) 02/09/2020   GFRAA 67 02/09/2020     Medications: I have reviewed the patient's current medications.   Assessment/Plan: 1. Stage I (T1 NX) left-sided breast cancer diagnosed in April 1997. She was treated with a lumpectomy, left breast radiation, CMF chemotherapy, and 5 years of tamoxifen. 2. Metastatic carcinoma involving a left axillary lymph  node, ER-positive, PR-positive, and HER-2-negative. She is status post a biopsy of a left axillary lymph node 08/13/2010. Status post a left axillary lymph node dissection 09/13/2010 with the pathology confirming 3/11 axillary lymph nodes containing metastatic carcinoma. A staging PET scan on 08/23/2010 confirmed a hypermetabolic left axillary lymph node and no other evidence of metastatic disease.  1. Status post 4 cycles of "adjuvant" T-C chemotherapy with cycle #4 given on 01/07/2011. 2. She began Arimidex on 03/26/2011 3. Left arm lymphedema, likely lymphedema related to the left axillary lymph node dissection. She is now followed at the lymphedema clinic. Improved with physical therapy and a lymphedema sleeve. 4. Pain and numbness at the fingers bilaterally-right greater than left, she reports being diagnosed with carpal tunnel syndrome and underwent a right sided carpal tunnel surgery with clinical improvement  5. Osteopenia-she received Zometa 08/19/2019, bone density scan 02/09/2019-osteopenia  6. Hypertension 7.  Altered mental status 2019-undergoing evaluation by neurology, diagnosed with dementia 8.  Elevated vitamin B12 level 04/02/2018- nonspecific   Disposition: Shirley Melendez is in clinical remission from breast cancer.  She will continue Arimidex.  She continues every 67-month Zometa for treatment of osteopenia. We discussed the indication for continuing Zometa given her significant dementia.  Shirley Melendez and her daughter are comfortable continuing Zometa for now.  She will be scheduled for a mammogram.  Shirley Melendez will return for an office visit and Zometa in 6 months.  Betsy Coder, MD  08/28/2020  12:47 PM

## 2020-08-28 NOTE — Patient Instructions (Signed)
Zoledronic Acid Injection (Hypercalcemia, Oncology) What is this medicine? ZOLEDRONIC ACID (ZOE le dron ik AS id) slows calcium loss from bones. It high calcium levels in the blood from some kinds of cancer. It may be used in other people at risk for bone loss. This medicine may be used for other purposes; ask your health care provider or pharmacist if you have questions. COMMON BRAND NAME(S): Zometa What should I tell my health care provider before I take this medicine? They need to know if you have any of these conditions:  cancer  dehydration  dental disease  kidney disease  liver disease  low levels of calcium in the blood  lung or breathing disease (asthma)  receiving steroids like dexamethasone or prednisone  an unusual or allergic reaction to zoledronic acid, other medicines, foods, dyes, or preservatives  pregnant or trying to get pregnant  breast-feeding How should I use this medicine? This drug is injected into a vein. It is given by a health care provider in a hospital or clinic setting. Talk to your health care provider about the use of this drug in children. Special care may be needed. Overdosage: If you think you have taken too much of this medicine contact a poison control center or emergency room at once. NOTE: This medicine is only for you. Do not share this medicine with others. What if I miss a dose? Keep appointments for follow-up doses. It is important not to miss your dose. Call your health care provider if you are unable to keep an appointment. What may interact with this medicine?  certain antibiotics given by injection  NSAIDs, medicines for pain and inflammation, like ibuprofen or naproxen  some diuretics like bumetanide, furosemide  teriparatide  thalidomide This list may not describe all possible interactions. Give your health care provider a list of all the medicines, herbs, non-prescription drugs, or dietary supplements you use. Also tell  them if you smoke, drink alcohol, or use illegal drugs. Some items may interact with your medicine. What should I watch for while using this medicine? Visit your health care provider for regular checks on your progress. It may be some time before you see the benefit from this drug. Some people who take this drug have severe bone, joint, or muscle pain. This drug may also increase your risk for jaw problems or a broken thigh bone. Tell your health care provider right away if you have severe pain in your jaw, bones, joints, or muscles. Tell you health care provider if you have any pain that does not go away or that gets worse. Tell your dentist and dental surgeon that you are taking this drug. You should not have major dental surgery while on this drug. See your dentist to have a dental exam and fix any dental problems before starting this drug. Take good care of your teeth while on this drug. Make sure you see your dentist for regular follow-up appointments. You should make sure you get enough calcium and vitamin D while you are taking this drug. Discuss the foods you eat and the vitamins you take with your health care provider. Check with your health care provider if you have severe diarrhea, nausea, and vomiting, or if you sweat a lot. The loss of too much body fluid may make it dangerous for you to take this drug. You may need blood work done while you are taking this drug. Do not become pregnant while taking this drug. Women should inform their health care provider   if they wish to become pregnant or think they might be pregnant. There is potential for serious harm to an unborn child. Talk to your health care provider for more information. What side effects may I notice from receiving this medicine? Side effects that you should report to your doctor or health care provider as soon as possible:  allergic reactions (skin rash, itching or hives; swelling of the face, lips, or tongue)  bone  pain  infection (fever, chills, cough, sore throat, pain or trouble passing urine)  jaw pain, especially after dental work  joint pain  kidney injury (trouble passing urine or change in the amount of urine)  low blood pressure (dizziness; feeling faint or lightheaded, falls; unusually weak or tired)  low calcium levels (fast heartbeat; muscle cramps or pain; pain, tingling, or numbness in the hands or feet; seizures)  low magnesium levels (fast, irregular heartbeat; muscle cramp or pain; muscle weakness; tremors; seizures)  low red blood cell counts (trouble breathing; feeling faint; lightheaded, falls; unusually weak or tired)  muscle pain  redness, blistering, peeling, or loosening of the skin, including inside the mouth  severe diarrhea  swelling of the ankles, feet, hands  trouble breathing Side effects that usually do not require medical attention (report to your doctor or health care provider if they continue or are bothersome):  anxious  constipation  coughing  depressed mood  eye irritation, itching, or pain  fever  general ill feeling or flu-like symptoms  nausea  pain, redness, or irritation at site where injected  trouble sleeping This list may not describe all possible side effects. Call your doctor for medical advice about side effects. You may report side effects to FDA at 1-800-FDA-1088. Where should I keep my medicine? This drug is given in a hospital or clinic. It will not be stored at home. NOTE: This sheet is a summary. It may not cover all possible information. If you have questions about this medicine, talk to your doctor, pharmacist, or health care provider.  2021 Elsevier/Gold Standard (2019-03-18 09:13:00)  

## 2020-08-29 ENCOUNTER — Telehealth: Payer: Self-pay | Admitting: Oncology

## 2020-08-29 NOTE — Telephone Encounter (Signed)
SCHEDULED APPT PER 3/14 LOS- LEFT MESSAGE FOR PATIENT WITH APPT DATE AND TIME

## 2020-09-05 ENCOUNTER — Encounter: Payer: Self-pay | Admitting: Oncology

## 2020-09-18 ENCOUNTER — Ambulatory Visit: Payer: Medicare PPO | Admitting: Neurology

## 2020-09-18 ENCOUNTER — Encounter: Payer: Self-pay | Admitting: Neurology

## 2020-09-18 VITALS — BP 132/75 | HR 84 | Ht 61.0 in | Wt 110.0 lb

## 2020-09-18 DIAGNOSIS — F0390 Unspecified dementia without behavioral disturbance: Secondary | ICD-10-CM

## 2020-09-18 MED ORDER — RIVASTIGMINE TARTRATE 3 MG PO CAPS: 3.0000 mg | ORAL_CAPSULE | Freq: Two times a day (BID) | ORAL | 5 refills | Status: DC

## 2020-09-18 MED ORDER — MEMANTINE HCL 10 MG PO TABS: 10.0000 mg | ORAL_TABLET | Freq: Two times a day (BID) | ORAL | 4 refills | Status: DC

## 2020-09-18 NOTE — Patient Instructions (Signed)
Increase the Exelon 3 mg twice daily Continue the Namenda Recommend close supervision  See you back in 6 months

## 2020-09-18 NOTE — Progress Notes (Signed)
PATIENT: Shirley Melendez DOB: 01/17/1951  REASON FOR VISIT: follow up HISTORY FROM: patient  HISTORY OF PRESENT ILLNESS: Today 09/18/20  HISTORY Shirley Melendez is a 70 year old female, seen in request by [name], [name] for evaluation of memory loss, she is accompanied by her [name] at today's clinical visit, initial evaluation was on March 04, 2018.  I have reviewed and summarized the referring note from the referring physician, she had a history of left breast cancer, [name], stage I left-sided breast cancer in April 1997, treated with lobectomy radiation, CMF chemotherapy, 5 years of tamoxifen, metastatic carcinoma involving left axillary lymph node, had left axillary lymph node dissection on September 13, 2010, status post 4 cycle adjuvant TC chemotherapy, completed on January 07, 2011, begin Arimidex in December 2012, left arm lymphedema,  She is a retired Art therapist, enjoys reading, had a sudden onset memory change since May 2019, she has chronic insomnia, was given a prescription of trazodone in May 2019 to June 2019, which did not help her sleep much, but during that period of time, she began to have spells of hallucinations, seeing people at her house, woke up in the middle of the night confused, staring at her husband could not recognize him, spell lasted for few minutes, intermittent,  Since trazodone was stopped, she remains symptomatic, sometimes while eating breakfast, she is confused where she is, could not recognize her husband  UPDATE Apr 02 2018: She is accompanied by her husband and daughter [name] at today's visit, we personally reviewed MRI of the brain with without contrast on March 21, 2018, there is evidence of generalized atrophy, progressed compared to previous CT scan in 2010, mild supratentorium small vessel disease.  She has stopped trazodone since August 2019, she continue have episode of confusion,  she gave me an example, one afternoon she came back from outside to her house, saw a man she seems to know before trying explain to her that she married to him for 41 years, there are three different mans, husband explained that it was him trying to explains to her what is going on, she was confused.  She denies significant memory loss, but has intermittent confusion spells,  Laboratory evaluations showed elevated B12 1698, mild elevated creatinine 1.02, elevated total cholesterol 227, LDL 128, normal CBC, TSH, A1c was 5.8,  Virtual Visit via telephone on October 07 2018  She is with her husband during the interview, reviewed her medication list, she is only taking arimidex 1mg  daily, cal-Vit D, cymbalta 60mg  daily, lysine 500mg  prn, metoprolol 50mg  daily, MVI, spironolactone 25mg  daily. Vit E.  She has tried aricept, complains of dizziness and GI side effect, has vivid nightmares, falling off her bed during sleep, never started on namenda, was not able to complete neuropsychiatric evaluations,   She is doing better, she eats well, sleeps well now, she still cooks, work on plant in her garden, likes to read, fiction, historical stories. She is not exercise regularly.  She does not have hallucinations, occasionally floaters.   Laboratory evaluations in Jan 2020, Vit B12 1170, BMP, Creat 1.16, normal CBC  UPDATE Aug 02 2019: She is accompanied by her daughter at today's clinical visit, she continues to decline slowly, there was intermittent episode of confusion, to the point that she could not recognize her husband, repeating questions, she is often frustrated about it.  She has good appetite, she denies difficulty moving, was relatively isolated during  winter months and COVID-19.   Update February 07, 2020 SS: EEG in March 2021 showed generalized background slowing, indicating mild bihemispheric malfunction, common etiology metabolic and central nervous system degenerative  disorder.  Accompanied by her daughter, remains on Namenda.  Feels doing fairly well.  Her husband has taken over organizing her pillbox, she mostly remembers to take the pills.  She does her own ADLs, cooking, but getting take out for dinner, having a cleaning lady come in.  No longer driving, decided with her family to ensure safety.  Denies any hallucinations.  Does report floaters and peripheral vision, described as bright light or "movement", not hallucinations.  Daughter thinks hallucinations, may actually be that she couldn't remember family members, thought it was strange man, but actually her husband.  No recent falls.  Has noted some occasional groaning, could be awake or asleep, felt to be related to anxiety. Never had neuropsychological evaluation. Aricept previously resulted in GI symptoms and vivid dreams.  Update September 18, 2020 SS: Had neuropsychological evaluation, concerning for Lewy body dementia, but does not that the presentation perfectly, but is felt to be a better fit then AD or FTD.  This was in October 2021, recommend repeat eval in 12 to 18 months.  Here today for evaluation accompanied by her daughter, [name] (she thought was her mother, started crying when corrected).  Continues to live with her husband.  He manages her medications.  She no longer does any cooking, they mostly get takeout, daughter orders groceries.  In general sleeps well, has a good appetite.  Occasionally may see out of her periphery of black shadow at the bottom of the window, very brief, then flies away.  Is not scary.  Requiring more assistance with showering, dressing.  Enjoys sitting outside.  Are considering an Charity fundraiser.  The previous neuropsychological evaluation was major source of anxiety for her.  MMSE 19/30 today. Tolerates Exelon without adverse effects. Are times she forgets who her husband is. More confusion in late afternoons.  REVIEW OF SYSTEMS: Out of a complete 14 system review of  symptoms, the patient complains only of the following symptoms, and all other reviewed systems are negative.  Memory loss   ALLERGIES: Allergies  Allergen Reactions  . Compazine Other (See Comments)    Muscle spasms in neck & back with jaw tightness  . Donepezil Other (See Comments)    Upset stomach  . Trazodone And Nefazodone     Confusion and hallucinations  . Erythromycin Nausea And Vomiting  . Prochlorperazine Edisylate     nausea    HOME MEDICATIONS: Outpatient Medications Prior to Visit  Medication Sig Dispense Refill  . anastrozole (ARIMIDEX) 1 MG tablet TAKE ONE TABLET BY MOUTH DAILY 90 tablet 3  . Calcium Carbonate-Vitamin D (CALCIUM 600 + D PO) Take 1 capsule by mouth 2 (two) times daily. 600/400    . DULoxetine (CYMBALTA) 60 MG capsule TAKE ONE CAPSULE BY MOUTH DAILY 90 capsule 1  . ibuprofen (ADVIL,MOTRIN) 200 MG tablet Take 200 mg by mouth every 6 (six) hours as needed.    Marland Kitchen LYSINE PO Take 500 mg by mouth as needed.    . memantine (NAMENDA) 10 MG tablet Take 1 tablet (10 mg total) by mouth 2 (two) times daily. 180 tablet 4  . metoprolol tartrate (LOPRESSOR) 50 MG tablet Take 1 tablet (50 mg total) by mouth 2 (two) times daily. 180 tablet 1  . Multiple Vitamins-Minerals (SENIOR MULTIVITAMIN PLUS PO) Take by mouth daily.    Marland Kitchen  NON FORMULARY Antifungal cream from [place]    . rivastigmine (EXELON) 1.5 MG capsule TAKE ONE CAPSULE BY MOUTH TWICE A DAY 60 capsule 5  . spironolactone (ALDACTONE) 25 MG tablet Take 1 tablet (25 mg total) by mouth daily. 90 tablet 1  . Vitamin E 200 UNITS TABS Take 400 Units by mouth daily.      No facility-administered medications prior to visit.    PAST MEDICAL HISTORY: Past Medical History:  Diagnosis Date  . Arthritis    chronic neck and back pain  . Cervicalgia 04/12/2009   Gets massage once a month; related to arthritis    . CTS (carpal tunnel syndrome) 2013  . Essential hypertension, benign 07/16/2013  . Fasting  hyperglycemia 2012   FBS 110  . High serum vitamin B12 04/02/2018  . History of breast cancer; left 1997 and 2012   Patient diagnosed with invasive ductal carcinoma on 10/14/95 after undergoing excision of left breast mass. Patient then underwent left partial mastectomy on 10/30/95. Pathology showed two foci of invasive ductal carcinoma, grade 1. Patient then underwent a needle biopsy of a left axillary lymph node on 08/13/10 which showed metastatic carcinoma. She underwent left axillary node dissection on 09/13/10  . History of chemotherapy 1997  . History of radiation therapy 1997   x2  . Hyperlipidemia   . 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 Lower back pain 04/12/2009   Related to scoliosis  . Lymphedema of upper extremity following lymphadenectomy 11/07/2011  . Major neurocognitive disorder 02/07/2020   Unclear etiology  . MRSA (methicillin resistant staph aureus) culture positive 2012   swab was positive, treated per protocol.  No other issues.  . Neuropathy due to chemotherapeutic drug 02/18/2018  . Osteoarthritis 08/24/2008   Neck, back, knee    . Osteopenia   . PONV (postoperative nausea and vomiting) 1997   no problems since  . Prediabetes 12/11/2016    PAST SURGICAL HISTORY: Past Surgical History:  Procedure Laterality Date  . AXILLARY NODE DISSECTION  08/2010   left  . AXILLARY NODE DISSECTION Left 2012   with axillary dissection on left  . BREAST BIOPSY  2012   x2  . BREAST LUMPECTOMY  09/1995, 10/1995   left - lumpectomy   . BREAST LUMPECTOMY  2012   Lumpectomy Axilla   . BREAST SURGERY  2012    axillary node dissection left  . CARPAL TUNNEL RELEASE  10/29/2011   Procedure: CARPAL TUNNEL RELEASE;  Surgeon: Cammie Sickle., MD;  Location: [facility];  Service: Orthopedics;  Laterality: Right;  . cataract surgery  Bilateral 2020  . COLONOSCOPY  2005   negative  . DILATION AND CURETTAGE,  DIAGNOSTIC / THERAPEUTIC  2002  . KNEE SURGERY  2000   left  . TRIGGER FINGER RELEASE  10/29/2011   Procedure: RELEASE TRIGGER FINGER/A-1 PULLEY;  Surgeon: Cammie Sickle., MD;  Location: [facility];  Service: Orthopedics;  Laterality: Right;  right long and right index     FAMILY HISTORY: Family History  Problem Relation Age of Onset  . Hypertension Mother   . Arthritis Mother        OA  . Dementia Mother   . Other Father        unknown medical history  . Arthritis Maternal Grandfather        OA  . Heart attack Maternal Grandfather 87  . Dementia Maternal  Grandfather   . Heart attack Maternal Uncle 65  . Dementia Maternal Grandmother   . Stroke Neg Hx   . Diabetes Neg Hx   . Breast cancer Neg Hx     SOCIAL HISTORY: Social History   Socioeconomic History  . Marital status: Married    Spouse name: Not on file  . Number of children: 1  . Years of education: 48  . Highest education level: Master's degree (e.g., MA, MS, MEng, MEd, MSW, MBA)  Occupational History  . Occupation: Retired  Tobacco Use  . Smoking status: Never Smoker  . Smokeless tobacco: Never Used  Vaping Use  . Vaping Use: Never used  Substance and Sexual Activity  . Alcohol use: No    Alcohol/week: 0.0 standard drinks  . Drug use: No  . Sexual activity: Not on file  Other Topics Concern  . Not on file  Social History Narrative   Retired Licensed conveyancer      Exercise: walking      Lives at home with her husband.   Right-handed.   2-3 cups coffee per day.   Social Determinants of Health   Financial Resource Strain: Not on file  Food Insecurity: Not on file  Transportation Needs: Not on file  Physical Activity: Not on file  Stress: Not on file  Social Connections: Not on file  Intimate Partner Violence: Not on file    PHYSICAL EXAM  Vitals:   09/18/20 1042  BP: 132/75  Pulse: 84  Weight: 110 lb (49.9 kg)  Height: 5\' 1"  (1.549 m)   Body mass index is 20.78 kg/m. MMSE  - Mini Mental State Exam 09/18/2020 02/07/2020 08/02/2019  Orientation to time 3 3 5   Orientation to Place 5 4 3   Registration 3 3 3   Attention/ Calculation 0 1 4  Recall 2 3 3   Language- name 2 objects 2 2 2   Language- repeat 1 1 1   Language- follow 3 step command 1 3 3   Language- read & follow direction 1 1 1   Write a sentence 0 1 1  Copy design 1 0 1  Copy design-comments - 10 animals -  Total score 19 22 27     Generalized: Well developed, in no acute distress  Neurological examination  Mentation: Alert, cooperative, participatory.  Most history is provided by her daughter. At times the patient goes off topic, answers the wrong question.  Speech is clear.  No speech delay. Cranial nerve II-XII: Pupils were equal round reactive to light. Extraocular movements were full, visual field were full on confrontational test. Facial sensation and strength were normal. Head turning and shoulder shrug  were normal and symmetric. Motor: The motor testing reveals 5 over 5 strength of all 4 extremities. Good symmetric motor tone is noted throughout.  No tremor or rigidity. Sensory: Sensory testing is intact to soft touch on all 4 extremities. No evidence of extinction is noted.  Coordination: Cerebellar testing reveals good finger-nose-finger and heel-to-shin bilaterally.  Gait and station: Gait is normal.  No shuffling, good pace. Reflexes: Deep tendon reflexes are symmetric and normal bilaterally.   DIAGNOSTIC DATA (LABS, IMAGING, TESTING) - I reviewed patient records, labs, notes, testing and imaging myself where available.  Lab Results  Component Value Date   WBC 8.8 08/19/2019   HGB 15.2 (H) 08/19/2019   HCT 44.2 08/19/2019   MCV 92.7 08/19/2019   PLT 296 08/19/2019      Component Value Date/Time   NA 133 (L) 08/28/2020 1212  NA 139 01/06/2017 1200   K 4.5 08/28/2020 1212   K 4.0 01/06/2017 1200   CL 102 08/28/2020 1212   CO2 25 08/28/2020 1212   CO2 26 01/06/2017 1200   GLUCOSE  110 (H) 08/28/2020 1212   GLUCOSE 112 01/06/2017 1200   BUN 18 08/28/2020 1212   BUN 17.8 01/06/2017 1200   CREATININE 1.06 (H) 08/28/2020 1212   CREATININE 0.99 (H) 02/09/2020 1217   CREATININE 1.0 01/06/2017 1200   CALCIUM 9.4 08/28/2020 1212   CALCIUM 9.9 01/06/2017 1200   PROT 6.1 02/09/2020 1217   ALBUMIN 4.3 02/09/2019 1337   AST 16 02/09/2020 1217   AST 20 05/04/2018 1321   ALT 17 02/09/2020 1217   ALT 21 05/04/2018 1321   ALKPHOS 62 02/09/2019 1337   BILITOT 0.9 02/09/2020 1217   BILITOT 0.8 05/04/2018 1321   GFRNONAA 57 (L) 08/28/2020 1212   GFRNONAA 58 (L) 02/09/2020 1217   GFRAA 67 02/09/2020 1217   Lab Results  Component Value Date   CHOL 227 (H) 02/09/2019   HDL 85.90 02/09/2019   LDLCALC 113 (H) 02/09/2019   LDLDIRECT 129.3 09/27/2011   TRIG 143.0 02/09/2019   CHOLHDL 3 02/09/2019   Lab Results  Component Value Date   HGBA1C 5.7 (H) 02/09/2020   Lab Results  Component Value Date   [id number] 939 08/19/2019   Lab Results  Component Value Date   TSH 1.39 02/09/2019   ASSESSMENT AND PLAN 70 y.o. year old female  has a past medical history of Arthritis, Cervicalgia (04/12/2009), CTS (carpal tunnel syndrome) (2013), Essential hypertension, benign (07/16/2013), Fasting hyperglycemia (2012), High serum vitamin B12 (04/02/2018), History of breast cancer; left (1997 and 2012), History of chemotherapy (1997), History of radiation therapy (1997), Hyperlipidemia, Left arm swelling (2012), Lower back pain (04/12/2009), Lymphedema of upper extremity following lymphadenectomy (11/07/2011), Major neurocognitive disorder (02/07/2020), MRSA (methicillin resistant staph aureus) culture positive (2012), Neuropathy due to chemotherapeutic drug (02/18/2018), Osteoarthritis (08/24/2008), Osteopenia, PONV (postoperative nausea and vomiting) (1997), and Prediabetes (12/11/2016). here with:  1.  Dementia  -Slow worsening, fluctuating symptoms, hallucinations, visual disturbances,  MMSE 19/30 today  -Neuropsychological evaluation in October 2021 with Dr. Melvyn Novas was concerning for LBD, but did not fit the presentation perfectly, but felt more consistent then AD or FTD, recommended repeat in 12 to 18 months  -In the interval since last seen, seems to have been some decline in independent function, requiring more assistance with ADLs  -Tolerating Exelon after initiation at last visit well, no adverse effects noted, will try a slight increase 3 mg twice daily  -Continue Namenda 10 mg twice a day  -Has seen ophthalmology, no etiology found for floaters and peripheral vision  -EEG showed generalized background slowing, indicating mild hemisphere malfunction, common etiology metabolic and central nervous system degenerative disorder, no epileptiform discharge noted  -Follow-up in 6 months or sooner if needed with Dr. [name]   -Repeat neuropsychological testing was recommended, her daughter is unsure she will be able to do this, was quite anxiety provoking for the patient  I spent 30 minutes of face-to-face and non-face-to-face time with patient.  This included previsit chart review, lab review, study review, order entry, electronic health record documentation, patient education.  Butler Denmark, AGNP-C, DNP 09/18/2020, 11:04 AM [facility] [address], [place], [place] [address] [phone]

## 2020-10-05 ENCOUNTER — Ambulatory Visit: Payer: Medicare PPO | Admitting: Internal Medicine

## 2020-10-31 NOTE — Patient Instructions (Addendum)
Try to stay as active as possible    Medications changes include :   none  Your prescription(s) have been submitted to your pharmacy. Please take as directed and contact our office if you believe you are having problem(s) with the medication(s).    Please followup in 6 months     Dementia Dementia is a condition that affects the way the brain works. It often affects memory and thinking. There are many types of dementia. Some types get worse with time and cannot be reversed. Some types of dementia include:  Alzheimer's disease. This is the most common type.  Vascular dementia. This type may happen due to a stroke.  Lewy body dementia. This type may happen to people who have Parkinson's disease.  Frontotemporal dementia. This type is caused by damage to nerve cells in certain parts of the brain. Some people may have more than one type. What are the causes? This condition is caused by damage to cells in the brain. Some causes that cannot be reversed include:  Having a condition that affects the blood vessels of the brain, such as diabetes, heart disease, or blood vessel disease.  Changes to genes. Some causes that can be reversed or slowed include:  Injury to the brain.  Certain medicines.  Infection.  Not having enough vitamin B12 in the body, or thyroid problems.  A tumor, blood clot, or too much fluid in the brain.  Certain diseases that cause your body's defense system (immune system) to attack healthy parts of the body. What are the signs or symptoms?  Problems remembering events or people.  Having trouble taking a bath or putting clothes on.  Forgetting appointments.  Forgetting to pay bills.  Trouble planning and making meals.  Having trouble speaking.  Getting lost easily.  Changes in behavior or mood. How is this treated? Treatment depends on the cause of the dementia. It might include:  Taking medicines for symptoms or to help control or slow  down the dementia.  Treating the cause of your dementia. Your doctor can help you find support groups and other doctors who can help with your care. Follow these instructions at home: Medicines  Take over-the-counter and prescription medicines only as told by your doctor.  Use a pill organizer to help you manage your medicines.  Avoidtaking medicines for pain or for sleep. Lifestyle  Make healthy choices: ? Be active as told by your doctor. ? Do not smoke or use any products that contain nicotine or tobacco. If you need help quitting, ask your doctor. ? Do not drink alcohol. ? When you get stressed, do something that will help you relax. Your doctor can give you tips. ? Spend time with other people.  Make sure you get good sleep. To get good sleep: ? Try not to take naps during the day. ? Keep your bedroom dark and cool. ? In the few hours before you go to bed, try not to do any exercise. ? Do not have foods and drinks with caffeine at night. Eating and drinking  Drink enough fluid to keep your pee (urine) pale yellow.  Eat a healthy diet. General instructions  Talk with your doctor to figure out: ? What you need help with. ? What your safety needs are.  Ask your doctor if it is safe for you to drive.  If told, wear a bracelet that tracks where you are or shows that you are a person with memory loss.  Work with your  family to make big decisions.  Keep all follow-up visits.   Where to find more information  Alzheimer's Association: CapitalMile.co.nz  National Institute on Aging: DVDEnthusiasts.nl  World Health Organization: RoleLink.com.br Contact a doctor if:  You have any new symptoms.  Your symptoms get worse.  You have problems with swallowing or choking. Get help right away if:  You feel very sad, or feel that you want to harm yourself.  Your family members are worried for your safety. Get help right away if you feel like you may hurt yourself or  others, or have thoughts about taking your own life. Go to your nearest emergency room or:  Call your local emergency services (911 in the [place]  Call the [place] at (413) 553-2157. This is open 24 hours a day.  Text the Crisis Text Line at (873)090-3843. Summary  Dementia often affects memory and thinking.  Some types of dementia get worse with time and cannot be reversed.  Treatment for this condition depends on the cause.  Talk with your doctor to figure out what you need help with.  Your doctor can help you find support groups and other doctors who can help with your care. This information is not intended to replace advice given to you by your health care provider. Make sure you discuss any questions you have with your health care provider. Document Revised: 10/18/2019 Document Reviewed: 10/18/2019 Elsevier Patient Education  [place].

## 2020-10-31 NOTE — Progress Notes (Signed)
Subjective:    Patient ID: Shirley Melendez, female    DOB: 09-12-1950, 70 y.o.   MRN: [id number]  HPI The patient is here for follow up of their chronic medical problems, including htn, prediabetes, toxic neuropathy, dementia.    She feels more fatigued.  She sleeps off and on at night. She does not nap during the day.   She helps a little in the yard.  She does no other exercise.    She is eating a little less - do to the "spiciness of the food".    In the past 6 week her husband thinks it has been harder for her to do things in the yard and she is going to bed sooner.  She is less wanting to do things.   Medications and allergies reviewed with patient and updated if appropriate.  Patient Active Problem List   Diagnosis Date Noted  . Major neurocognitive disorder 02/07/2020  . High serum vitamin B12 04/02/2018  . 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 Prediabetes 12/11/2016  . Essential hypertension, benign 07/16/2013  . Lymphedema of upper extremity following lymphadenectomy 11/07/2011  . Cervicalgia 04/12/2009  . Lower back pain 04/12/2009  . Hyperlipidemia 08/24/2008  . Osteoarthritis 08/24/2008  . Osteopenia 08/24/2008  . History of breast cancer 08/24/2008    Current Outpatient Medications on File Prior to Visit  Medication Sig Dispense Refill  . anastrozole (ARIMIDEX) 1 MG tablet TAKE ONE TABLET BY MOUTH DAILY 90 tablet 3  . Calcium Carbonate-Vitamin D (CALCIUM 600 + D PO) Take 1 capsule by mouth 2 (two) times daily. 600/400    . DULoxetine (CYMBALTA) 60 MG capsule TAKE ONE CAPSULE BY MOUTH DAILY 90 capsule 1  . ibuprofen (ADVIL,MOTRIN) 200 MG tablet Take 200 mg by mouth every 6 (six) hours as needed.    Marland Kitchen LYSINE PO Take 500 mg by mouth as needed.    . memantine (NAMENDA) 10 MG tablet Take 1 tablet (10 mg total) by mouth 2 (two) times daily. 180 tablet 4  . metoprolol tartrate (LOPRESSOR) 50 MG tablet Take 1 tablet (50 mg total) by mouth 2 (two) times  daily. 180 tablet 1  . Multiple Vitamins-Minerals (SENIOR MULTIVITAMIN PLUS PO) Take by mouth daily.    . NON FORMULARY Antifungal cream from [place]    . rivastigmine (EXELON) 3 MG capsule Take 1 capsule (3 mg total) by mouth 2 (two) times daily. 60 capsule 5  . spironolactone (ALDACTONE) 25 MG tablet Take 1 tablet (25 mg total) by mouth daily. 90 tablet 1  . Vitamin E 200 UNITS TABS Take 400 Units by mouth daily.      No current facility-administered medications on file prior to visit.    Past Medical History:  Diagnosis Date  . Arthritis    chronic neck and back pain  . Cervicalgia 04/12/2009   Gets massage once a month; related to arthritis    . CTS (carpal tunnel syndrome) 2013  . Essential hypertension, benign 07/16/2013  . Fasting hyperglycemia 2012   FBS 110  . High serum vitamin B12 04/02/2018  . History of breast cancer; left 1997 and 2012   Patient diagnosed with invasive ductal carcinoma on 10/14/95 after undergoing excision of left breast mass. Patient then underwent left partial mastectomy on 10/30/95. Pathology showed two foci of invasive ductal carcinoma, grade 1. Patient then underwent a needle biopsy of a left axillary lymph node on 08/13/10 which showed metastatic carcinoma. She underwent left axillary  node dissection on 09/13/10  . History of chemotherapy 1997  . History of radiation therapy 1997   x2  . Hyperlipidemia   . 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 Lower back pain 04/12/2009   Related to scoliosis  . Lymphedema of upper extremity following lymphadenectomy 11/07/2011  . Major neurocognitive disorder 02/07/2020   Unclear etiology  . MRSA (methicillin resistant staph aureus) culture positive 2012   swab was positive, treated per protocol.  No other issues.  . Neuropathy due to chemotherapeutic drug 02/18/2018  . Osteoarthritis 08/24/2008   Neck, back, knee    . Osteopenia   . PONV  (postoperative nausea and vomiting) 1997   no problems since  . Prediabetes 12/11/2016    Past Surgical History:  Procedure Laterality Date  . AXILLARY NODE DISSECTION  08/2010   left  . AXILLARY NODE DISSECTION Left 2012   with axillary dissection on left  . BREAST BIOPSY  2012   x2  . BREAST LUMPECTOMY  09/1995, 10/1995   left - lumpectomy   . BREAST LUMPECTOMY  2012   Lumpectomy Axilla   . BREAST SURGERY  2012    axillary node dissection left  . CARPAL TUNNEL RELEASE  10/29/2011   Procedure: CARPAL TUNNEL RELEASE;  Surgeon: Cammie Sickle., MD;  Location: [facility];  Service: Orthopedics;  Laterality: Right;  . cataract surgery  Bilateral 2020  . COLONOSCOPY  2005   negative  . DILATION AND CURETTAGE, DIAGNOSTIC / THERAPEUTIC  2002  . KNEE SURGERY  2000   left  . TRIGGER FINGER RELEASE  10/29/2011   Procedure: RELEASE TRIGGER FINGER/A-1 PULLEY;  Surgeon: Cammie Sickle., MD;  Location: [facility];  Service: Orthopedics;  Laterality: Right;  right long and right index     Social History   Socioeconomic History  . Marital status: Married    Spouse name: Not on file  . Number of children: 1  . Years of education: 14  . Highest education level: Master's degree (e.g., MA, MS, MEng, MEd, MSW, MBA)  Occupational History  . Occupation: Retired  Tobacco Use  . Smoking status: Never Smoker  . Smokeless tobacco: Never Used  Vaping Use  . Vaping Use: Never used  Substance and Sexual Activity  . Alcohol use: No    Alcohol/week: 0.0 standard drinks  . Drug use: No  . Sexual activity: Not on file  Other Topics Concern  . Not on file  Social History Narrative   Retired Licensed conveyancer      Exercise: walking      Lives at home with her husband.   Right-handed.   2-3 cups coffee per day.   Social Determinants of Health   Financial Resource Strain: Not on file  Food Insecurity: Not on file  Transportation Needs: Not on file  Physical  Activity: Not on file  Stress: Not on file  Social Connections: Not on file    Family History  Problem Relation Age of Onset  . Hypertension Mother   . Arthritis Mother        OA  . Dementia Mother   . Other Father        unknown medical history  . Arthritis Maternal Grandfather        OA  . Heart attack Maternal Grandfather 87  . Dementia Maternal Grandfather   . Heart attack Maternal Uncle 65  . Dementia Maternal Grandmother   .  Stroke Neg Hx   . Diabetes Neg Hx   . Breast cancer Neg Hx     Review of Systems  Constitutional: Negative for fever.  Respiratory: Negative for cough, shortness of breath and wheezing.   Cardiovascular: Negative for chest pain, palpitations and leg swelling.  Neurological: Negative for light-headedness and headaches.       Objective:   Vitals:   11/01/20 1516  BP: 130/80  Pulse: 98  Temp: 98.9 F (37.2 C)  SpO2: 99%   BP Readings from Last 3 Encounters:  11/01/20 130/80  09/18/20 132/75  08/28/20 119/75   Wt Readings from Last 3 Encounters:  11/01/20 103 lb (46.7 kg)  09/18/20 110 lb (49.9 kg)  08/28/20 109 lb 3.2 oz (49.5 kg)   Body mass index is 19.46 kg/m.   Physical Exam    Constitutional: Appears well-developed and well-nourished. No distress.  HENT:  Head: Normocephalic and atraumatic.  Neck: Neck supple. No tracheal deviation present. No thyromegaly present.  No cervical lymphadenopathy Cardiovascular: Normal rate, regular rhythm and normal heart sounds.   No murmur heard. No carotid bruit .  No edema Pulmonary/Chest: Effort normal and breath sounds normal. No respiratory distress. No has no wheezes. No rales.  Skin: Skin is warm and dry. Not diaphoretic.  Psychiatric: Normal mood and affect. Behavior is normal.      Assessment & Plan:    See Problem List for Assessment and Plan of chronic medical problems.    This visit occurred during the SARS-CoV-2 public health emergency.  Safety protocols were in  place, including screening questions prior to the visit, additional usage of staff PPE, and extensive cleaning of exam room while observing appropriate contact time as indicated for disinfecting solutions.

## 2020-11-01 ENCOUNTER — Other Ambulatory Visit: Payer: Self-pay

## 2020-11-01 ENCOUNTER — Ambulatory Visit: Payer: Medicare PPO | Admitting: Internal Medicine

## 2020-11-01 ENCOUNTER — Encounter: Payer: Self-pay | Admitting: Internal Medicine

## 2020-11-01 VITALS — BP 130/80 | HR 98 | Temp 98.9°F | Ht 61.0 in | Wt 103.0 lb

## 2020-11-01 DIAGNOSIS — F0390 Unspecified dementia without behavioral disturbance: Secondary | ICD-10-CM | POA: Diagnosis not present

## 2020-11-01 DIAGNOSIS — R7303 Prediabetes: Secondary | ICD-10-CM

## 2020-11-01 DIAGNOSIS — I10 Essential (primary) hypertension: Secondary | ICD-10-CM | POA: Diagnosis not present

## 2020-11-01 DIAGNOSIS — G622 Polyneuropathy due to other toxic agents: Secondary | ICD-10-CM | POA: Diagnosis not present

## 2020-11-01 MED ORDER — DULOXETINE HCL 60 MG PO CPEP: 60.0000 mg | ORAL_CAPSULE | Freq: Every day | ORAL | 1 refills | Status: DC

## 2020-11-01 MED ORDER — SPIRONOLACTONE 25 MG PO TABS: 25.0000 mg | ORAL_TABLET | Freq: Every day | ORAL | 1 refills | Status: DC

## 2020-11-01 NOTE — Assessment & Plan Note (Addendum)
Chronic encouraged regular exercise and healthy diet We will recheck blood work at her next visit in 6 months

## 2020-11-01 NOTE — Assessment & Plan Note (Addendum)
Chronic Management per Dr [name] Continue current medications Both of them seemed a little confused about her dementia and the progression of it.  Tried to explain some of the basics of dementia, but I am not sure either one of them fully understood Encouraged him to make an early appointment with neurology

## 2020-11-01 NOTE — Assessment & Plan Note (Signed)
Chronic Related to chemo Controlled Continue cymbalta 60 mg daily

## 2020-11-01 NOTE — Assessment & Plan Note (Signed)
Chronic BP well controlled Continue metoprolol 50 mg bid, aldactone 25 mg qd

## 2020-12-01 ENCOUNTER — Ambulatory Visit (INDEPENDENT_AMBULATORY_CARE_PROVIDER_SITE_OTHER): Payer: Medicare PPO | Admitting: Podiatry

## 2020-12-01 ENCOUNTER — Encounter: Payer: Self-pay | Admitting: Podiatry

## 2020-12-01 ENCOUNTER — Other Ambulatory Visit: Payer: Self-pay

## 2020-12-01 ENCOUNTER — Ambulatory Visit
Admission: RE | Admit: 2020-12-01 | Discharge: 2020-12-01 | Disposition: A | Discharge status: Home / Self Care | Payer: Medicare PPO | Source: Ambulatory Visit | Attending: Oncology | Admitting: Oncology

## 2020-12-01 DIAGNOSIS — B351 Tinea unguium: Secondary | ICD-10-CM | POA: Diagnosis not present

## 2020-12-01 DIAGNOSIS — M79675 Pain in left toe(s): Secondary | ICD-10-CM | POA: Diagnosis not present

## 2020-12-01 DIAGNOSIS — M79674 Pain in right toe(s): Secondary | ICD-10-CM

## 2020-12-01 DIAGNOSIS — Z1231 Encounter for screening mammogram for malignant neoplasm of breast: Secondary | ICD-10-CM | POA: Diagnosis not present

## 2020-12-01 DIAGNOSIS — Z853 Personal history of malignant neoplasm of breast: Secondary | ICD-10-CM

## 2020-12-04 ENCOUNTER — Encounter: Payer: Self-pay | Admitting: Oncology

## 2020-12-05 ENCOUNTER — Other Ambulatory Visit: Payer: Self-pay | Admitting: Family Medicine

## 2020-12-05 ENCOUNTER — Other Ambulatory Visit: Payer: Self-pay | Admitting: Oncology

## 2020-12-05 DIAGNOSIS — R928 Other abnormal and inconclusive findings on diagnostic imaging of breast: Secondary | ICD-10-CM

## 2020-12-07 NOTE — Progress Notes (Signed)
Subjective: Shirley Melendez is a pleasant 70 y.o. female patient seen today painful thick toenails that are difficult to trim. Pain interferes with ambulation. Aggravating factors include wearing enclosed shoe gear. Pain is relieved with periodic professional debridement.  PCP is [name], MD. Last visit was: 11/01/2020.  Allergies  Allergen Reactions   Compazine Other (See Comments)    Muscle spasms in neck & back with jaw tightness   Donepezil Other (See Comments)    Upset stomach   Trazodone And Nefazodone     Confusion and hallucinations   Erythromycin Nausea And Vomiting   Prochlorperazine Edisylate     nausea    Objective: Physical Exam  General: Vanita Cannell Rosales is a pleasant 70 y.o. Caucasian female, WD, WN in NAD. AAO x 3.   Vascular:  Capillary refill time to digits immediate b/l. Palpable pedal pulses b/l LE. Pedal hair absent. Lower extremity skin temperature gradient within normal limits. No pain with calf compression b/l.  Dermatological:  Pedal skin with normal turgor, texture and tone b/l lower extremities No open wounds b/l lower extremities No interdigital macerations b/l lower extremities Toenails 1-5 b/l elongated, discolored, dystrophic, thickened, crumbly with subungual debris and tenderness to dorsal palpation.  Musculoskeletal:  Normal muscle strength 5/5 to all lower extremity muscle groups bilaterally. No pain crepitus or joint limitation noted with ROM b/l. No gross bony deformities bilaterally.  Neurological:  Protective sensation intact 5/5 intact bilaterally with 10g monofilament b/l.  Assessment and Plan:  No diagnosis found.   -Examined patient. -Patient to continue soft, supportive shoe gear daily. -Toenails 1-5 b/l were debrided in length and girth with sterile nail nippers and dremel without iatrogenic bleeding.  -Patient to report any pedal injuries to medical professional immediately. -Patient/POA to call should there be  question/concern in the interim.  Return in about 3 months (around 03/03/2021).  [name], DPM

## 2021-01-01 ENCOUNTER — Ambulatory Visit: Payer: Medicare PPO

## 2021-01-01 ENCOUNTER — Other Ambulatory Visit: Payer: Self-pay

## 2021-01-01 ENCOUNTER — Ambulatory Visit
Admission: RE | Admit: 2021-01-01 | Discharge: 2021-01-01 | Disposition: A | Discharge status: Home / Self Care | Payer: Medicare PPO | Source: Ambulatory Visit | Attending: Oncology | Admitting: Oncology

## 2021-01-01 DIAGNOSIS — R922 Inconclusive mammogram: Secondary | ICD-10-CM | POA: Diagnosis not present

## 2021-01-01 DIAGNOSIS — R928 Other abnormal and inconclusive findings on diagnostic imaging of breast: Secondary | ICD-10-CM

## 2021-01-09 ENCOUNTER — Telehealth: Payer: Self-pay | Admitting: Internal Medicine

## 2021-01-09 NOTE — Telephone Encounter (Signed)
LVM for pt to rtn my call to schedule AWV with [facility]. Please schedule AWV if pt calls the office  

## 2021-01-15 ENCOUNTER — Telehealth: Payer: Self-pay | Admitting: Neurology

## 2021-01-15 NOTE — Telephone Encounter (Signed)
Pt's husband called stating that the pt's Dementia has been getting worse and is needing to discuss with the RN. Please advise.

## 2021-01-16 NOTE — Telephone Encounter (Signed)
Unable to LVM, stated mailbox was full.  Also sent Mychart message.

## 2021-01-16 NOTE — Telephone Encounter (Signed)
I spoke to the patient's husband. No acute changes in memory but has concerns about the progression of her dementia. He said she has good and bad days. Sometimes she does not feel like getting out of the bed. She is active once up, especially if she is working in her flowers. He has to makes sure she eats regularly. He will supplement her nutrition with Boost. Confusion is a little worse in the evenings but she is sleeping well throughout the night. He is not interested in her repeating the neuropsychiatric testing as mentioned in the past. He is not interested in adding any medications at this point. We will note these concerns. He will keep her follow up scheduled 04/09/21. He will call us back with any other concerns. Their daughter is also very involved in her care.

## 2021-01-30 ENCOUNTER — Other Ambulatory Visit: Payer: Self-pay | Admitting: Oncology

## 2021-01-30 DIAGNOSIS — Z853 Personal history of malignant neoplasm of breast: Secondary | ICD-10-CM

## 2021-02-09 ENCOUNTER — Telehealth: Payer: Self-pay | Admitting: Oncology

## 2021-02-09 NOTE — Telephone Encounter (Signed)
Rescheduled 9/9 appointment to an earlier time due to a provider meeting. Called patient and left message with new appt date and time

## 2021-02-19 ENCOUNTER — Encounter: Payer: Self-pay | Admitting: Oncology

## 2021-02-23 ENCOUNTER — Inpatient Hospital Stay: Payer: Medicare PPO | Admitting: Oncology

## 2021-02-23 ENCOUNTER — Inpatient Hospital Stay: Payer: Medicare PPO

## 2021-02-23 ENCOUNTER — Ambulatory Visit: Payer: Medicare PPO

## 2021-02-23 ENCOUNTER — Other Ambulatory Visit: Payer: Medicare PPO

## 2021-02-23 ENCOUNTER — Other Ambulatory Visit: Payer: Self-pay

## 2021-02-23 ENCOUNTER — Ambulatory Visit: Payer: Medicare PPO | Admitting: Oncology

## 2021-02-23 ENCOUNTER — Inpatient Hospital Stay: Payer: Medicare PPO | Attending: Oncology

## 2021-02-23 VITALS — BP 132/73 | HR 97 | Temp 98.2°F | Resp 18 | Ht 61.0 in | Wt 100.3 lb

## 2021-02-23 DIAGNOSIS — M7989 Other specified soft tissue disorders: Secondary | ICD-10-CM | POA: Diagnosis not present

## 2021-02-23 DIAGNOSIS — M899 Disorder of bone, unspecified: Secondary | ICD-10-CM

## 2021-02-23 DIAGNOSIS — M79645 Pain in left finger(s): Secondary | ICD-10-CM | POA: Insufficient documentation

## 2021-02-23 DIAGNOSIS — R5381 Other malaise: Secondary | ICD-10-CM | POA: Insufficient documentation

## 2021-02-23 DIAGNOSIS — R4182 Altered mental status, unspecified: Secondary | ICD-10-CM | POA: Insufficient documentation

## 2021-02-23 DIAGNOSIS — M8580 Other specified disorders of bone density and structure, unspecified site: Secondary | ICD-10-CM | POA: Diagnosis not present

## 2021-02-23 DIAGNOSIS — C50912 Malignant neoplasm of unspecified site of left female breast: Secondary | ICD-10-CM | POA: Insufficient documentation

## 2021-02-23 DIAGNOSIS — Z923 Personal history of irradiation: Secondary | ICD-10-CM | POA: Insufficient documentation

## 2021-02-23 DIAGNOSIS — I10 Essential (primary) hypertension: Secondary | ICD-10-CM | POA: Diagnosis not present

## 2021-02-23 DIAGNOSIS — R609 Edema, unspecified: Secondary | ICD-10-CM | POA: Insufficient documentation

## 2021-02-23 DIAGNOSIS — Z170 Estrogen receptor positive status [ER+]: Secondary | ICD-10-CM | POA: Insufficient documentation

## 2021-02-23 DIAGNOSIS — C773 Secondary and unspecified malignant neoplasm of axilla and upper limb lymph nodes: Secondary | ICD-10-CM | POA: Insufficient documentation

## 2021-02-23 DIAGNOSIS — Z853 Personal history of malignant neoplasm of breast: Secondary | ICD-10-CM

## 2021-02-23 DIAGNOSIS — Z79811 Long term (current) use of aromatase inhibitors: Secondary | ICD-10-CM | POA: Insufficient documentation

## 2021-02-23 DIAGNOSIS — M79644 Pain in right finger(s): Secondary | ICD-10-CM | POA: Insufficient documentation

## 2021-02-23 LAB — CBC WITH DIFFERENTIAL (CANCER CENTER ONLY)
Abs Immature Granulocytes: 0.05 10*3/uL (ref 0.00–0.07)
Basophils Absolute: 0 10*3/uL (ref 0.0–0.1)
Basophils Relative: 0 %
Eosinophils Absolute: 0.1 10*3/uL (ref 0.0–0.5)
Eosinophils Relative: 2 %
HCT: 45.4 % (ref 36.0–46.0)
Hemoglobin: 15.6 g/dL — ABNORMAL HIGH (ref 12.0–15.0)
Immature Granulocytes: 1 %
Lymphocytes Relative: 12 %
Lymphs Abs: 1.1 10*3/uL (ref 0.7–4.0)
MCH: 31.5 pg (ref 26.0–34.0)
MCHC: 34.4 g/dL (ref 30.0–36.0)
MCV: 91.7 fL (ref 80.0–100.0)
Monocytes Absolute: 0.8 10*3/uL (ref 0.1–1.0)
Monocytes Relative: 9 %
Neutro Abs: 6.9 10*3/uL (ref 1.7–7.7)
Neutrophils Relative %: 76 %
Platelet Count: 280 10*3/uL (ref 150–400)
RBC: 4.95 MIL/uL (ref 3.87–5.11)
RDW: 12.5 % (ref 11.5–15.5)
WBC Count: 9 10*3/uL (ref 4.0–10.5)
nRBC: 0 % (ref 0.0–0.2)

## 2021-02-23 LAB — BASIC METABOLIC PANEL - CANCER CENTER ONLY
Anion gap: 10 (ref 5–15)
BUN: 19 mg/dL (ref 8–23)
CO2: 25 mmol/L (ref 22–32)
Calcium: 9 mg/dL (ref 8.9–10.3)
Chloride: 103 mmol/L (ref 98–111)
Creatinine: 0.87 mg/dL (ref 0.44–1.00)
GFR, Estimated: >60 mL/min (ref >60)
Glucose, Bld: 96 mg/dL (ref 70–99)
Potassium: 3.7 mmol/L (ref 3.5–5.1)
Sodium: 138 mmol/L (ref 135–145)

## 2021-02-23 MED ORDER — ZOLEDRONIC ACID 4 MG/100ML IV SOLN
4.0000 mg | Freq: Once | INTRAVENOUS | Status: AC
  Administered 2021-02-23: 4 mg via INTRAVENOUS
  Filled 2021-02-23: qty 100

## 2021-02-23 MED ORDER — SODIUM CHLORIDE 0.9 % IV SOLN: Freq: Once | INTRAVENOUS | Status: AC

## 2021-02-23 NOTE — Progress Notes (Signed)
Patient presents for treatment. RN assessment completed along with the following:  Treatment Conditions (labs/vitals/weight) reviewed - Yes, and within treatment parameters. Oncology Treatment Attestation completed for current therapy- not needed Informed consent completed and reflects current therapy/intent -  not needed Provider progress note reviewed - Today's progress note is not yet available. I reviewed the most recent oncology provider progress note in chart dated 08/28/20. Treatment/Antibody/Supportive plan/S&H and other orders reviewed - Yes, and there are no adjustments needed for today's treatment. Previous treatment date reviewed - Yes, and the appropriate amount of time has elapsed between treatments. Clinic Hand Off Received from - no  Patient to proceed with treatment.

## 2021-02-23 NOTE — Patient Instructions (Signed)

## 2021-02-23 NOTE — Progress Notes (Signed)
[facility] OFFICE PROGRESS NOTE   Diagnosis: Breast cancer  INTERVAL HISTORY:   Shirley Melendez returns as scheduled.  She continues anastrozole.  A bilateral mammogram 12/01/2020 revealed a possible asymmetry in the right breast.  A diagnostic unilateral right breast tomosynthesis study on 01/01/2021 revealed resolution of the previously noted asymmetry at posterior depth in the superior right breast.  No suspicious findings were noted.  A screening mammogram is recommended in 1 year.  She is here with her daughter.  Shirley Melendez reports malaise.  She has persistent mild swelling in the left arm.  Her daughter acknowledges Shirley Melendez has progressive memory loss.  She is cared for in the home by her husband.  Objective:  Vital signs in last 24 hours:  Blood pressure 132/73, pulse 97, temperature 98.2 F (36.8 C), temperature source Oral, resp. rate 18, height $RemoveBe'5\' 1"'[id number]$  (1.549 m), weight 100 lb 4.8 oz (45.5 kg), SpO2 98 %.    HEENT: Neck without mass Lymphatics: No cervical, supraclavicular, or axillary nodes Resp: Lungs clear bilaterally Cardio: Regular rate and rhythm GI: No hepatosplenomegaly Vascular: No leg edema, mild edema throughout the left arm Breast: Status post left lumpectomy.,  No evidence for local tumor recurrence, no mass in either breast Neurologic: Alert, follows commands, not oriented to month or year   Lab Results:  Lab Results  Component Value Date   WBC 9.0 02/23/2021   HGB 15.6 (H) 02/23/2021   HCT 45.4 02/23/2021   MCV 91.7 02/23/2021   PLT 280 02/23/2021   NEUTROABS 6.9 02/23/2021    CMP  Lab Results  Component Value Date   NA 133 (L) 08/28/2020   K 4.5 08/28/2020   CL 102 08/28/2020   CO2 25 08/28/2020   GLUCOSE 110 (H) 08/28/2020   BUN 18 08/28/2020   CREATININE 1.06 (H) 08/28/2020   CALCIUM 9.4 08/28/2020   PROT 6.1 02/09/2020   ALBUMIN 4.3 02/09/2019   AST 16 02/09/2020   ALT 17 02/09/2020   ALKPHOS 62 02/09/2019   BILITOT  0.9 02/09/2020   GFRNONAA 57 (L) 08/28/2020   GFRAA 67 02/09/2020    Medications: I have reviewed the patient's current medications.   Assessment/Plan: Stage I (T1 NX) left-sided breast cancer diagnosed in April 1997. She was treated with a lumpectomy, left breast radiation, CMF chemotherapy, and 5 years of tamoxifen. Metastatic carcinoma involving a left axillary lymph node, ER-positive, PR-positive, and HER-2-negative. She is status post a biopsy of a left axillary lymph node 08/13/2010. Status post a left axillary lymph node dissection 09/13/2010 with the pathology confirming 3/11 axillary lymph nodes containing metastatic carcinoma. A staging PET scan on 08/23/2010 confirmed a hypermetabolic left axillary lymph node and no other evidence of metastatic disease.   Status post 4 cycles of "adjuvant" T-C chemotherapy with cycle #4 given on 01/07/2011. She began Arimidex on 03/26/2011 Left arm lymphedema, likely lymphedema related to the left axillary lymph node dissection. She is now followed at the lymphedema clinic. Improved with physical therapy and a lymphedema sleeve. 4. Pain and numbness at the fingers bilaterally-right greater than left, she reports being diagnosed with carpal tunnel syndrome and underwent a right sided carpal tunnel surgery with clinical improvement   5. Osteopenia-she received Zometa 08/19/2019, bone density scan 02/09/2019-osteopenia  6. Hypertension 7.  Altered mental status 2019-undergoing evaluation by neurology, diagnosed with dementia 8.  Elevated vitamin B12 level 04/02/2018- nonspecific     Disposition: Shirley Melendez remains in clinical remission from breast cancer.  She  will continue anastrozole.  She has a history of osteopenia.  She has progressive dementia.  I discussed the indication for continuing Zometa, mammography surveillance, and clinical follow-up with her daughter.  They feel most comfortable continuing Zometa and oncology follow-up for now.  She will  complete another treatment with Zometa today.  She has tolerated Zometa well in the past.  She will receive the Zometa less frequently after today's visit.  She will return for an office visit in approximately 10 months.  We will decide on the indication for continuing Zometa and other interventions based on her mental status at that visit.  Her daughter will contact me if she develops any symptoms.  Betsy Coder, MD  02/23/2021  10:16 AM

## 2021-02-28 ENCOUNTER — Other Ambulatory Visit: Payer: Medicare PPO

## 2021-02-28 ENCOUNTER — Ambulatory Visit: Payer: Medicare PPO

## 2021-02-28 ENCOUNTER — Ambulatory Visit: Payer: Medicare PPO | Admitting: Oncology

## 2021-03-12 ENCOUNTER — Ambulatory Visit: Payer: Medicare PPO | Admitting: Podiatry

## 2021-03-12 ENCOUNTER — Encounter: Payer: Self-pay | Admitting: Podiatry

## 2021-03-12 ENCOUNTER — Other Ambulatory Visit: Payer: Self-pay

## 2021-03-12 DIAGNOSIS — M79675 Pain in left toe(s): Secondary | ICD-10-CM

## 2021-03-12 DIAGNOSIS — M79674 Pain in right toe(s): Secondary | ICD-10-CM | POA: Diagnosis not present

## 2021-03-12 DIAGNOSIS — B351 Tinea unguium: Secondary | ICD-10-CM | POA: Diagnosis not present

## 2021-03-16 NOTE — Progress Notes (Signed)
Subjective: Shirley Melendez is a 70 y.o. female patient seen today for follow up of  painful thick toenails that are difficult to trim. Pain interferes with ambulation. Aggravating factors include wearing enclosed shoe gear. Pain is relieved with periodic professional debridement.  She is accompanied by her caregiver on today's visit.  PCP is [name], MD. Last visit was: 01/09/2021.  Allergies  Allergen Reactions   Compazine Other (See Comments)    Muscle spasms in neck & back with jaw tightness   Donepezil Other (See Comments)    Upset stomach   Trazodone And Nefazodone     Confusion and hallucinations   Erythromycin Nausea And Vomiting   Prochlorperazine Edisylate     nausea    PCP is Burns, Claudina Lick, MD .  Objective: Physical Exam  General: Patient is a pleasant 70 y.o. Caucasian female WD, WN in NAD. AAO x 3.   Neurovascular Examination: Capillary refill time to digits immediate b/l. Palpable pedal pulses b/l LE. Pedal hair absent. Lower extremity skin temperature gradient within normal limits. No pain with calf compression b/l. No edema noted b/l lower extremities.  Protective sensation intact 5/5 intact bilaterally with 10g monofilament b/l. Vibratory sensation intact b/l.  Dermatological:  Skin warm and supple b/l lower extremities. No open wounds b/l lower extremities. No interdigital macerations b/l lower extremities. Toenails 1-5 b/l elongated, discolored, dystrophic, thickened, crumbly with subungual debris and tenderness to dorsal palpation. Both great toes appear to be embedded distally. No erythema, no edema, no drainage.  Musculoskeletal:  Normal muscle strength 5/5 to all lower extremity muscle groups bilaterally. No gross bony deformities b/l lower extremities.  Assessment: 1. Pain due to onychomycosis of toenails of both feet    Plan: Patient was evaluated and treated and all questions answered. Consent given for treatment as described  below: -Examined patient. -Patient to continue soft, supportive shoe gear daily. -Toenails 1-5 b/l were debrided in length and girth with sterile nail nippers and dremel without iatrogenic bleeding. Cleaned around distal edge of both great toes. Applied triple antibiotic ointment. Instructed caregiver to apply Neosporin to both great toes once daily for one week. -Patient to report any pedal injuries to medical professional immediately. -Patient/POA to call should there be question/concern in the interim.  Return in about 3 months (around 06/11/2021).  [name], DPM

## 2021-04-09 ENCOUNTER — Ambulatory Visit: Payer: Medicare PPO | Admitting: Neurology

## 2021-04-09 ENCOUNTER — Encounter: Payer: Self-pay | Admitting: Neurology

## 2021-04-09 VITALS — BP 128/72 | HR 99 | Ht 61.0 in | Wt 102.0 lb

## 2021-04-09 DIAGNOSIS — F0390 Unspecified dementia without behavioral disturbance: Secondary | ICD-10-CM | POA: Diagnosis not present

## 2021-04-09 NOTE — Progress Notes (Signed)
ASSESSMENT AND PLAN 70 y.o. year old female    Dementia  Most consistent with central nervous system degenerative disorder  Continue to decline,  Has difficulty following three-step verbal commands,  Has been treated with Namenda 10 mg twice a day, Exelon 3 mg twice a day,  Daughter today worry about GI side effect, with decreased p.o. intake,  Will try to stop Exelon, then Namenda, may decrease Cymbalta from 60 to 30 mg daily  Return to clinic in 1 year    DIAGNOSTIC DATA (LABS, IMAGING, TESTING) - I reviewed patient records, labs, notes, testing and imaging myself where available.    HISTORY OF PRESENT ILLNESS: Today 04/09/21  HISTORY Shirley Melendez is a 70 year old female, seen in request by [name], [name] for evaluation of memory loss, she is accompanied by her husband Shirley Melendez at today's clinical visit, initial evaluation was on March 04, 2018.   I have reviewed and summarized the referring note from the referring physician, she had a history of left breast cancer, [name], stage I left-sided breast cancer in April 1997, treated with lobectomy radiation, CMF chemotherapy, 5 years of tamoxifen, metastatic carcinoma involving left axillary lymph node, had left axillary lymph node dissection on September 13, 2010, status post 4 cycle adjuvant TC chemotherapy, completed on January 07, 2011, begin Arimidex in December 2012, left arm lymphedema,   She is a retired Art therapist, enjoys reading, had a sudden onset memory change since May 2019, she has chronic insomnia, was given a prescription of trazodone in May 2019 to June 2019, which did not help her sleep much, but during that period of time, she began to have spells of hallucinations, seeing people at her house, woke up in the middle of the night confused, staring at her husband could not recognize him, spell lasted for few minutes, intermittent,   Since trazodone was stopped, she  remains symptomatic, sometimes while eating breakfast, she is confused where she is, could not recognize her husband   UPDATE Apr 02 2018: She is accompanied by her husband and daughter Shirley Melendez at today's visit, we personally reviewed MRI of the brain with without contrast on March 21, 2018, there is evidence of generalized atrophy, progressed compared to previous CT scan in 2010, mild supratentorium small vessel disease.   She has stopped trazodone since August 2019, she continue have episode of confusion, she gave me an example, one afternoon she came back from outside to her house, saw a man she seems to know before trying explain to her that she married to him for 41 years, there are three different mans, husband explained that it was him trying to explains to her what is going on, she was confused.   She denies significant memory loss, but has intermittent confusion spells,   Laboratory evaluations showed elevated B12 1698, mild elevated creatinine 1.02, elevated total cholesterol 227, LDL 128, normal CBC, TSH, A1c was 5.8,   Virtual Visit via telephone on October 07 2018   She is with her husband during the interview, reviewed her medication list, she is only taking arimidex 1mg  daily, cal-Vit D, cymbalta 60mg  daily, lysine 500mg  prn, metoprolol 50mg  daily, MVI, spironolactone 25mg  daily. Vit E.   She has tried aricept, complains of dizziness and GI side effect, has vivid nightmares, falling off her bed during sleep, never started on namenda, was not able to complete neuropsychiatric evaluations,    She is doing better, she eats  well, sleeps well now, she still cooks, work on plant in her garden, likes to read, fiction, historical stories. She is not exercise regularly.  She does not have hallucinations, occasionally floaters.    Laboratory evaluations in Jan 2020, Vit B12 1170, BMP, Creat 1.16, normal CBC   UPDATE Aug 02 2019: She is accompanied by her daughter at today's clinical visit,  she continues to decline slowly, there was intermittent episode of confusion, to the point that she could not recognize her husband, repeating questions, she is often frustrated about it.  She has good appetite, she denies difficulty moving, was relatively isolated during winter months and COVID-19.  UPDATE Apr 09 2021: She is accompanied by her daughter at today's clinical visit, her memory loss continued decline, word finding difficulties in completing her sentence, difficulty falling 3 steps command, she is very frustrated with Mini-Mental status/MoCA examination, does not want to cooperative on the exam, difficulty following verbal command,  Daughter is also concerned that her husband has difficulty to let the help into their house, concerned that her husband might dealing with cognitive issues  Patient was noted to have decreased appetite, daughter want to give her a chance to try lower dose of Exelon and Namenda, was not sure about the benefit anyway  She has occasional urinary incontinence, frequent awakening at nighttime, got lost on her way to the bathroom,  PHYSICAL EXAM  Vitals:   04/09/21 1129  BP: 128/72  Pulse: 99  Weight: 102 lb (46.3 kg)  Height: 5\' 1"  (1.549 m)   Body mass index is 19.27 kg/m. MMSE - Mini Mental State Exam 09/18/2020 02/07/2020 08/02/2019  Orientation to time 3 3 5   Orientation to Place 5 4 3   Registration 3 3 3   Attention/ Calculation 0 1 4  Recall 2 3 3   Language- name 2 objects 2 2 2   Language- repeat 1 1 1   Language- follow 3 step command 1 3 3   Language- read & follow direction 1 1 1   Write a sentence 0 1 1  Copy design 1 0 1  Copy design-comments - 10 animals -  Total score 19 22 27     Generalized: Well developed, in no acute distress  Neurological examination  Mentation: Alert, cooperative, but has difficulty following verbal command, could not following three-step commands, no dysarthria, word finding difficulties, difficult to complete  full sentence Cranial nerve II-XII: Pupils were equal round reactive to light. Extraocular movements were full, visual field were full on confrontational test. Facial sensation and strength were normal. Head turning and shoulder shrug  were normal and symmetric. Motor: Moves 4 extremities without difficulties, Coordination: No dysmetria or truncal ataxia.  Gait and station: Mildly unsteady gait Reflexes: Deep tendon reflexes are symmetric and normal bilaterally.   REVIEW OF SYSTEMS: Out of a complete 14 system review of symptoms, the patient complains only of the following symptoms, and all other reviewed systems are negative.  Memory loss   ALLERGIES: Allergies  Allergen Reactions   Compazine Other (See Comments)    Muscle spasms in neck & back with jaw tightness   Donepezil Other (See Comments)    Upset stomach   Trazodone And Nefazodone     Confusion and hallucinations   Erythromycin Nausea And Vomiting   Prochlorperazine Edisylate     nausea    HOME MEDICATIONS: Outpatient Medications Prior to Visit  Medication Sig Dispense Refill   anastrozole (ARIMIDEX) 1 MG tablet TAKE ONE TABLET BY MOUTH DAILY 90 tablet 3  Calcium Carbonate-Vitamin D (CALCIUM 600 + D PO) Take 1 capsule by mouth 2 (two) times daily. 600/400     DULoxetine (CYMBALTA) 60 MG capsule Take 1 capsule (60 mg total) by mouth daily. 90 capsule 1   ibuprofen (ADVIL,MOTRIN) 200 MG tablet Take 200 mg by mouth every 6 (six) hours as needed.     LYSINE PO Take 500 mg by mouth as needed.     memantine (NAMENDA) 10 MG tablet Take 1 tablet (10 mg total) by mouth 2 (two) times daily. 180 tablet 4   metoprolol tartrate (LOPRESSOR) 50 MG tablet Take 1 tablet (50 mg total) by mouth 2 (two) times daily. 180 tablet 1   Multiple Vitamins-Minerals (SENIOR MULTIVITAMIN PLUS PO) Take by mouth daily.     NON FORMULARY Antifungal cream from [facility]     rivastigmine (EXELON) 3 MG capsule Take 1 capsule (3 mg total) by  mouth 2 (two) times daily. 60 capsule 5   spironolactone (ALDACTONE) 25 MG tablet Take 1 tablet (25 mg total) by mouth daily. 90 tablet 1   Vitamin E 200 UNITS TABS Take 400 Units by mouth daily.      No facility-administered medications prior to visit.    PAST MEDICAL HISTORY: Past Medical History:  Diagnosis Date   Arthritis    chronic neck and back pain   Cervicalgia 04/12/2009   Gets massage once a month; related to arthritis     CTS (carpal tunnel syndrome) 2013   Essential hypertension, benign 07/16/2013   Fasting hyperglycemia 2012   FBS 110   High serum vitamin B12 04/02/2018   History of breast cancer; left 1997 and 2012   Patient diagnosed with invasive ductal carcinoma on 10/14/95 after undergoing excision of left breast mass. Patient then underwent left partial mastectomy on 10/30/95. Pathology showed two foci of invasive ductal carcinoma, grade 1. Patient then underwent a needle biopsy of a left axillary lymph node on 08/13/10 which showed metastatic carcinoma. 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  No other issues.   Neuropathy due to chemotherapeutic drug 02/18/2018   Osteoarthritis 08/24/2008   Neck, back, knee     Osteopenia    PONV (postoperative nausea and vomiting) 1997   no problems since   Prediabetes 12/11/2016    PAST SURGICAL HISTORY: Past Surgical History:  Procedure Laterality Date   AXILLARY NODE DISSECTION  08/2010   left   AXILLARY NODE DISSECTION Left 2012   with  axillary dissection on left   BREAST BIOPSY  2012   x2   BREAST LUMPECTOMY  09/1995, 10/1995   left - lumpectomy    BREAST LUMPECTOMY  2012   Lumpectomy Axilla    BREAST SURGERY  2012    axillary node dissection left   CARPAL TUNNEL RELEASE  10/29/2011   Procedure: CARPAL TUNNEL RELEASE;  Surgeon: Cammie Sickle., MD;  Location: [facility];  Service: Orthopedics;  Laterality: Right;   cataract surgery  Bilateral 2020   COLONOSCOPY  2005   negative   DILATION AND CURETTAGE, DIAGNOSTIC / THERAPEUTIC  2002   KNEE SURGERY  2000   left   TRIGGER FINGER RELEASE  10/29/2011   Procedure: RELEASE TRIGGER FINGER/A-1 PULLEY;  Surgeon: Cammie Sickle., MD;  Location: [facility];  Service: Orthopedics;  Laterality: Right;  right long and right index     FAMILY HISTORY: Family History  Problem Relation Age of Onset   Hypertension Mother    Arthritis Mother        OA   Dementia Mother    Other Father        unknown medical history   Arthritis Maternal Grandfather        OA   Heart attack Maternal Grandfather 87   Dementia Maternal Grandfather    Heart attack Maternal Uncle 65   Dementia Maternal Grandmother    Stroke Neg Hx    Diabetes Neg Hx    Breast cancer Neg Hx     SOCIAL HISTORY: Social History   Socioeconomic History   Marital status: Married    Spouse name: Not on file   Number of children: 1   Years of education: 18   Highest education level: Master's degree (e.g., MA, MS, MEng, MEd, MSW, MBA)  Occupational History   Occupation: Retired  Tobacco Use   Smoking status: Never   Smokeless tobacco: Never  Vaping Use   Vaping Use: Never used  Substance and Sexual Activity   Alcohol use: No    Alcohol/week: 0.0 standard drinks   Drug use: No   Sexual activity: Not on file  Other Topics Concern   Not on file  Social History Narrative   Retired Licensed conveyancer      Exercise: walking      Lives at home with her husband.   Right-handed.    2-3 cups coffee per day.   Social Determinants of Health   Financial Resource Strain: Not on file  Food Insecurity: Not on file  Transportation Needs: Not on file  Physical Activity: Not on file  Stress: Not on file  Social Connections: Not on file  Intimate Partner Violence: Not on file      Marcial Pacas, M.D. Ph.D.  [facility] [place], [place] [address] Phone: [phone] Fax:      [phone]

## 2021-04-11 ENCOUNTER — Encounter: Payer: Self-pay | Admitting: Internal Medicine

## 2021-04-12 MED ORDER — DULOXETINE HCL 30 MG PO CPEP: 30.0000 mg | ORAL_CAPSULE | Freq: Every day | ORAL | 2 refills | Status: DC

## 2021-04-17 ENCOUNTER — Other Ambulatory Visit: Payer: Self-pay | Admitting: Neurology

## 2021-05-02 ENCOUNTER — Ambulatory Visit: Payer: Medicare PPO | Admitting: Internal Medicine

## 2021-05-07 ENCOUNTER — Other Ambulatory Visit: Payer: Self-pay | Admitting: Internal Medicine

## 2021-05-07 DIAGNOSIS — I10 Essential (primary) hypertension: Secondary | ICD-10-CM

## 2021-05-19 NOTE — Progress Notes (Signed)
Chart reviewed, agree above plan ?

## 2021-07-16 ENCOUNTER — Ambulatory Visit: Payer: Medicare PPO | Admitting: Podiatry

## 2021-07-20 ENCOUNTER — Ambulatory Visit: Payer: Medicare PPO | Admitting: Podiatry

## 2021-07-20 ENCOUNTER — Other Ambulatory Visit: Payer: Self-pay

## 2021-07-20 ENCOUNTER — Encounter: Payer: Self-pay | Admitting: Podiatry

## 2021-07-20 DIAGNOSIS — M79675 Pain in left toe(s): Secondary | ICD-10-CM | POA: Diagnosis not present

## 2021-07-20 DIAGNOSIS — G622 Polyneuropathy due to other toxic agents: Secondary | ICD-10-CM

## 2021-07-20 DIAGNOSIS — M79674 Pain in right toe(s): Secondary | ICD-10-CM

## 2021-07-20 DIAGNOSIS — I890 Lymphedema, not elsewhere classified: Secondary | ICD-10-CM

## 2021-07-20 DIAGNOSIS — B351 Tinea unguium: Secondary | ICD-10-CM

## 2021-07-20 DIAGNOSIS — E8989 Other postprocedural endocrine and metabolic complications and disorders: Secondary | ICD-10-CM | POA: Diagnosis not present

## 2021-07-20 NOTE — Progress Notes (Signed)
This patient returns to my office for at risk foot care.  This patient requires this care by a professional since this patient will be at risk due to having lymphedema and toxic neuropathy.  This patient is unable to cut nails herself since the patient cannot reach her nails.These nails are painful walking and wearing shoes.  This patient presents for at risk foot care today.  General Appearance  Alert, conversant and in no acute stress.  Vascular  Dorsalis pedis and posterior tibial  pulses are palpable  bilaterally.  Capillary return is within normal limits  bilaterally. Temperature is within normal limits  bilaterally.  Neurologic  Senn-Weinstein monofilament wire test within normal limits  bilaterally. Muscle power within normal limits bilaterally.  Nails Thick disfigured discolored nails with subungual debris  from hallux to fifth toes bilaterally. No evidence of bacterial infection or drainage bilaterally.  Orthopedic  No limitations of motion  feet .  No crepitus or effusions noted.  No bony pathology or digital deformities noted.  Skin  normotropic skin with no porokeratosis noted bilaterally.  No signs of infections or ulcers noted.     Onychomycosis  Pain in right toes  Pain in left toes  Consent was obtained for treatment procedures.   Mechanical debridement of nails 1-5  bilaterally performed with a nail nipper.  Filed with dremel without incident.    Return office visit  3 months                     Told patient to return for periodic foot care and evaluation due to potential at risk complications.   Gardiner Barefoot DPM

## 2021-08-10 ENCOUNTER — Other Ambulatory Visit: Payer: Self-pay | Admitting: Internal Medicine

## 2021-08-21 ENCOUNTER — Telehealth: Payer: Self-pay | Admitting: Internal Medicine

## 2021-08-21 NOTE — Telephone Encounter (Signed)
LVM for pt to rtn my call to schedle AWV with [facility]. Please schedule if pt calls the office.  ?

## 2021-08-31 ENCOUNTER — Telehealth: Payer: Self-pay | Admitting: Internal Medicine

## 2021-08-31 NOTE — Telephone Encounter (Signed)
Patient spouse calling in ? ?Wanted to know if there was any medication out on the marker that could slow down of reverse the effects of dementia ? ?Says he is noticing some changes w/ patient & he wants to discuss them w/ provider/nurse ? ?Please fu [phone] ?

## 2021-09-01 NOTE — Telephone Encounter (Signed)
It may be worth for them to see her neurologist again - she has been on the two treatments we typically use and the other one she did not tolerate.  As far as I know there is not any other treatments that will help.  She can try calling the neurologist and ask the question.    She is more than welcome to move up her appt to see me.  ?

## 2021-09-04 NOTE — Telephone Encounter (Signed)
Info given to patient's husband today. Printed out info and placed in outgoing mail today. ?

## 2021-10-16 ENCOUNTER — Encounter: Payer: Self-pay | Admitting: Oncology

## 2021-10-17 ENCOUNTER — Encounter: Payer: Self-pay | Admitting: Podiatry

## 2021-10-17 ENCOUNTER — Ambulatory Visit: Payer: Medicare PPO | Admitting: Podiatry

## 2021-10-17 DIAGNOSIS — M79675 Pain in left toe(s): Secondary | ICD-10-CM | POA: Diagnosis not present

## 2021-10-17 DIAGNOSIS — F0390 Unspecified dementia without behavioral disturbance: Secondary | ICD-10-CM

## 2021-10-17 DIAGNOSIS — G622 Polyneuropathy due to other toxic agents: Secondary | ICD-10-CM

## 2021-10-17 DIAGNOSIS — M79674 Pain in right toe(s): Secondary | ICD-10-CM | POA: Diagnosis not present

## 2021-10-17 DIAGNOSIS — B351 Tinea unguium: Secondary | ICD-10-CM

## 2021-10-17 NOTE — Progress Notes (Signed)
This patient returns to my office for at risk foot care.  This patient requires this care by a professional since this patient will be at risk due to having lymphedema and toxic neuropathy.  This patient is unable to cut nails herself since the patient cannot reach her nails.These nails are painful walking and wearing shoes.  This patient presents for at risk foot care today. ? ?General Appearance  Alert, conversant and in no acute stress. ? ?Vascular  Dorsalis pedis and posterior tibial  pulses are palpable  bilaterally.  Capillary return is within normal limits  bilaterally. Temperature is within normal limits  bilaterally. ? ?Neurologic  Senn-Weinstein monofilament wire test within normal limits  bilaterally. Muscle power within normal limits bilaterally. ? ?Nails Thick disfigured discolored nails with subungual debris  from hallux to fifth toes bilaterally. No evidence of bacterial infection or drainage bilaterally. ? ?Orthopedic  No limitations of motion  feet .  No crepitus or effusions noted.  No bony pathology or digital deformities noted. ? ?Skin  normotropic skin with no porokeratosis noted bilaterally.  No signs of infections or ulcers noted.    ? ?Onychomycosis  Pain in right toes  Pain in left toes ? ?Consent was obtained for treatment procedures.   Mechanical debridement of nails 1-5  bilaterally performed with a nail nipper.  Filed with dremel without incident.  ? ? ?Return office visit  3 months                     Told patient to return for periodic foot care and evaluation due to potential at risk complications. ? ? ?Gardiner Barefoot DPM   ?

## 2021-11-07 ENCOUNTER — Ambulatory Visit: Payer: Medicare PPO | Admitting: Internal Medicine

## 2021-11-08 ENCOUNTER — Encounter: Payer: Self-pay | Admitting: Internal Medicine

## 2021-11-08 NOTE — Progress Notes (Signed)
Subjective:    Patient ID: Shirley Melendez, female    DOB: 11-Mar-1951, 71 y.o.   MRN: [id number]     HPI Shirley Melendez is here for follow up of her chronic medical problems, including htn, prediabetes, toxic neuropathy, dementia.  She is here with her husband and caregiver.  Because of her dementia her caregiver provides most of the history  Her dementia has progressed per her daughter who informed me of this via MyChart.  Her caregiver also states that her dementia has worsened.  Confusion, wandering progressed.  Appetite decreased - more ensure and protein shakes.    Has stiffness in her muscles - difficulty getting her dressed.  Sometimes she has difficulty sitting down on the toilet.  She needs assistance with most ADLs.  Sometimes she will eat on her own and may just require prompting.  Sometimes they do help her.  She is getting up in the middle of the night.  She wanders during the day-gets up and walks around.  At breaskfast - zoning out - closes eyes and goes off to somewhere else.  Looks like she is sleeping.  She does not respond to calling her name or touching her.   Initially she would wake up with those stimuli.  She then wakes up and is fine.    More difficulty getting her up out of bed.  She is sleeping more.  Her caregiver gives her 3 prescription medications and a gummy vitamin.  She is not sure what medications she is taking-she has more on her list than what she is taking.   A couple of falls - minor injuries.    Medications and allergies reviewed with patient and updated if appropriate.  Current Outpatient Medications on File Prior to Visit  Medication Sig Dispense Refill   anastrozole (ARIMIDEX) 1 MG tablet TAKE ONE TABLET BY MOUTH DAILY 90 tablet 3   Calcium Carbonate-Vitamin D (CALCIUM 600 + D PO) Take 1 capsule by mouth 2 (two) times daily. 600/400     DULoxetine (CYMBALTA) 30 MG capsule Take 1 capsule (30 mg total) by mouth daily. 90 capsule 2   DULoxetine  (CYMBALTA) 60 MG capsule TAKE ONE CAPSULE BY MOUTH DAILY 90 capsule 0   ibuprofen (ADVIL,MOTRIN) 200 MG tablet Take 200 mg by mouth every 6 (six) hours as needed.     LYSINE PO Take 500 mg by mouth as needed.     memantine (NAMENDA) 10 MG tablet Take 1 tablet (10 mg total) by mouth 2 (two) times daily. 180 tablet 4   metoprolol tartrate (LOPRESSOR) 50 MG tablet Take 1 tablet (50 mg total) by mouth 2 (two) times daily. 180 tablet 1   Multiple Vitamins-Minerals (SENIOR MULTIVITAMIN PLUS PO) Take by mouth daily.     NON FORMULARY Antifungal cream from [facility]     rivastigmine (EXELON) 3 MG capsule Take 1 capsule (3 mg total) by mouth 2 (two) times daily. 60 capsule 5   spironolactone (ALDACTONE) 25 MG tablet TAKE ONE TABLET BY MOUTH DAILY 90 tablet 1   Vitamin E 200 UNITS TABS Take 400 Units by mouth daily.      No current facility-administered medications on file prior to visit.     Review of Systems  Unable to perform ROS: Dementia      Objective:   Vitals:   11/09/21 1055  BP: 122/78  Pulse: 96  Temp: 98.8 F (37.1 C)  SpO2: 96%   BP Readings from Last  3 Encounters:  11/09/21 122/78  04/09/21 128/72  02/23/21 132/73   Wt Readings from Last 3 Encounters:  11/09/21 103 lb (46.7 kg)  04/09/21 102 lb (46.3 kg)  02/23/21 100 lb 4.8 oz (45.5 kg)   Body mass index is 19.46 kg/m.    Physical Exam Constitutional:      General: She is not in acute distress.    Appearance: Normal appearance.  HENT:     Head: Normocephalic and atraumatic.  Eyes:     Conjunctiva/sclera: Conjunctivae normal.  Cardiovascular:     Rate and Rhythm: Normal rate and regular rhythm.     Heart sounds: Normal heart sounds. No murmur heard. Pulmonary:     Effort: Pulmonary effort is normal. No respiratory distress.     Breath sounds: Normal breath sounds. No wheezing.  Abdominal:     General: There is no distension.     Palpations: Abdomen is soft.     Tenderness: There is no  abdominal tenderness.  Musculoskeletal:     Cervical back: Neck supple.     Right lower leg: No edema.     Left lower leg: No edema.  Lymphadenopathy:     Cervical: No cervical adenopathy.  Skin:    General: Skin is warm and dry.     Findings: No rash.  Neurological:     Mental Status: She is alert.     Comments: Mild aphasia  Psychiatric:        Mood and Affect: Mood normal.        Behavior: Behavior normal.       Lab Results  Component Value Date   WBC 9.0 02/23/2021   HGB 15.6 (H) 02/23/2021   HCT 45.4 02/23/2021   PLT 280 02/23/2021   GLUCOSE 96 02/23/2021   CHOL 227 (H) 02/09/2019   TRIG 143.0 02/09/2019   HDL 85.90 02/09/2019   LDLDIRECT 129.3 09/27/2011   LDLCALC 113 (H) 02/09/2019   ALT 17 02/09/2020   AST 16 02/09/2020   NA 138 02/23/2021   K 3.7 02/23/2021   CL 103 02/23/2021   CREATININE 0.87 02/23/2021   BUN 19 02/23/2021   CO2 25 02/23/2021   TSH 1.39 02/09/2019   HGBA1C 5.7 (H) 02/09/2020     Assessment & Plan:    See Problem List for Assessment and Plan of chronic medical problems.

## 2021-11-08 NOTE — Patient Instructions (Addendum)
     Blood work was ordered.     Medications changes include :   none     Return in about 1 year (around 11/10/2022) for follow up.

## 2021-11-09 ENCOUNTER — Ambulatory Visit (INDEPENDENT_AMBULATORY_CARE_PROVIDER_SITE_OTHER): Payer: Medicare PPO

## 2021-11-09 ENCOUNTER — Ambulatory Visit: Payer: Medicare PPO | Admitting: Internal Medicine

## 2021-11-09 ENCOUNTER — Telehealth: Payer: Self-pay | Admitting: Internal Medicine

## 2021-11-09 VITALS — BP 122/78 | HR 96 | Temp 98.8°F | Ht 61.0 in | Wt 103.0 lb

## 2021-11-09 DIAGNOSIS — I10 Essential (primary) hypertension: Secondary | ICD-10-CM | POA: Diagnosis not present

## 2021-11-09 DIAGNOSIS — Z0000 Encounter for general adult medical examination without abnormal findings: Secondary | ICD-10-CM

## 2021-11-09 DIAGNOSIS — R7303 Prediabetes: Secondary | ICD-10-CM | POA: Diagnosis not present

## 2021-11-09 DIAGNOSIS — F0390 Unspecified dementia without behavioral disturbance: Secondary | ICD-10-CM

## 2021-11-09 DIAGNOSIS — G622 Polyneuropathy due to other toxic agents: Secondary | ICD-10-CM | POA: Diagnosis not present

## 2021-11-09 NOTE — Telephone Encounter (Signed)
Patient's husband called in to say that his wife did not say much during her visit today but he wanted to make sure that Dr. 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

## 2021-11-09 NOTE — Assessment & Plan Note (Signed)
Chronic Controlled, Stable Continue Cymbalta 60 mg daily

## 2021-11-09 NOTE — Assessment & Plan Note (Addendum)
Chronic Dementia has progressed and she does need assistance with all ADLs Confusion, wandering have increased She does have aphasia, stiffness Has a caregiver throughout the day-may not eventually need more assistance, especially at night.  Her husband is helping her at night ?  Taking both Namenda and Exelon

## 2021-11-09 NOTE — Assessment & Plan Note (Addendum)
Chronic Blood pressure well controlled CMP, CBC  ?  Taking any medication-I think she probably is not.  Her caregiver will check and update me on her current medications. It looks like she may be taking the spironolactone-that is an active prescription-if so continue 25 mg daily

## 2021-11-09 NOTE — Progress Notes (Signed)
Subjective:   Shirley Melendez is a 71 y.o. female who presents for Medicare Annual (Subsequent) preventive examination.  Review of Systems     Cardiac Risk Factors include: advanced age (>60mn, >>57women);dyslipidemia;family history of premature cardiovascular disease     Objective:    Today's Vitals   11/09/21 1143  BP: 122/78  Pulse: 96  Temp: 98.8 F (37.1 C)  SpO2: 96%  Weight: 103 lb (46.7 kg)  Height: '5\' 1"'$  (1.549 m)  PainSc: 0-No pain   Body mass index is 19.46 kg/m.     11/09/2021   12:05 PM 02/23/2021   11:09 AM 12/12/2017    2:30 PM 07/31/2017    2:47 PM 07/08/2016   11:01 AM 01/08/2016   11:06 AM 01/05/2015   10:47 AM  Advanced Directives  Does Patient Have a Medical Advance Directive? Yes Yes Yes Yes Yes Yes Yes  Type of Advance Directive Living will;[place]care [place] Living will Living will [place]  Does patient want to make changes to medical advance directive? No - Patient declined No - Patient declined Yes (ED - Information included in AVS) No - Patient declined   No - Patient declined  Copy of [place]in Chart? Yes - validated most recent copy scanned in chart (See row information) Yes - validated most recent copy scanned in chart (See row information)    Yes Yes    Current Medications (verified) Outpatient Encounter Medications as of 11/09/2021  Medication Sig   anastrozole (ARIMIDEX) 1 MG tablet TAKE ONE TABLET BY MOUTH DAILY   Calcium Carbonate-Vitamin D (CALCIUM 600 + D PO) Take 1 capsule by mouth 2 (two) times daily. 600/400   DULoxetine (CYMBALTA) 60 MG capsule TAKE ONE CAPSULE BY MOUTH DAILY   LYSINE PO Take 500 mg by mouth as needed.   memantine (NAMENDA) 10 MG tablet Take 1 tablet (10 mg total) by mouth 2 (two) times daily.   Multiple Vitamins-Minerals (SENIOR MULTIVITAMIN PLUS PO) Take by mouth daily.   NON FORMULARY Antifungal  cream from [place]  rivastigmine (EXELON) 3 MG capsule Take 1 capsule (3 mg total) by mouth 2 (two) times daily.   spironolactone (ALDACTONE) 25 MG tablet TAKE ONE TABLET BY MOUTH DAILY   Vitamin E 200 UNITS TABS Take 400 Units by mouth daily.    No facility-administered encounter medications on file as of 11/09/2021.    Allergies (verified) Compazine, Donepezil, Trazodone and nefazodone, Erythromycin, and Prochlorperazine edisylate   History: Past Medical History:  Diagnosis Date   Arthritis    chronic neck and back pain   Cervicalgia 04/12/2009   Gets massage once a month; related to arthritis     CTS (carpal tunnel syndrome) 2013   Essential hypertension, benign 07/16/2013   Fasting hyperglycemia 2012   FBS 110   High serum vitamin B12 04/02/2018   History of breast cancer; left 1997 and 2012   Patient diagnosed with invasive ductal carcinoma on 10/14/95 after undergoing excision of left breast mass. Patient then underwent left partial mastectomy on 10/30/95. Pathology showed two foci of invasive ductal carcinoma, grade 1. Patient then underwent a needle biopsy of a left axillary lymph node on 08/13/10 which showed metastatic carcinoma. She underwent left axillary node dissection on 09/13/10   History of chemotherapy 1997   History of radiation therapy 1997   x2   Hyperlipidemia    Left arm swelling  2012   lymphadema from axillary dissection, wears compression sleeve   Lower back pain 04/12/2009   Related to scoliosis   Lymphedema of upper extremity following lymphadenectomy 11/07/2011   Major neurocognitive disorder 02/07/2020   Unclear etiology   MRSA (methicillin resistant staph aureus) culture positive 2012   swab was positive, treated per protocol.  No other issues.   Neuropathy due to chemotherapeutic drug 02/18/2018   Osteoarthritis 08/24/2008   Neck, back, knee     Osteopenia    PONV (postoperative nausea and vomiting) 1997   no problems since    Prediabetes 12/11/2016   Past Surgical History:  Procedure Laterality Date   AXILLARY NODE DISSECTION  08/2010   left   AXILLARY NODE DISSECTION Left 2012   with axillary dissection on left   BREAST BIOPSY  2012   x2   BREAST LUMPECTOMY  09/1995, 10/1995   left - lumpectomy    BREAST LUMPECTOMY  2012   Lumpectomy Axilla    BREAST SURGERY  2012    axillary node dissection left   CARPAL TUNNEL RELEASE  10/29/2011   Procedure: CARPAL TUNNEL RELEASE;  Surgeon: Cammie Sickle., MD;  Location: [place];  Service: Orthopedics;  Laterality: Right;   cataract surgery  Bilateral 2020   COLONOSCOPY  2005   negative   DILATION AND CURETTAGE, DIAGNOSTIC / THERAPEUTIC  2002   KNEE SURGERY  2000   left   TRIGGER FINGER RELEASE  10/29/2011   Procedure: RELEASE TRIGGER FINGER/A-1 PULLEY;  Surgeon: Cammie Sickle., MD;  Location: [place];  Service: Orthopedics;  Laterality: Right;  right long and right index    Family History  Problem Relation Age of Onset   Hypertension Mother    Arthritis Mother        OA   Dementia Mother    Other Father        unknown medical history   Arthritis Maternal Grandfather        OA   Heart attack Maternal Grandfather 87   Dementia Maternal Grandfather    Heart attack Maternal Uncle 65   Dementia Maternal Grandmother    Stroke Neg Hx    Diabetes Neg Hx    Breast cancer Neg Hx    Social History   Socioeconomic History   Marital status: Married    Spouse name: Not on file   Number of children: 1   Years of education: 18   Highest education level: Master's degree (e.g., MA, MS, MEng, MEd, MSW, MBA)  Occupational History   Occupation: Retired  Tobacco Use   Smoking status: Never   Smokeless tobacco: Never  Vaping Use   Vaping Use: Never used  Substance and Sexual Activity   Alcohol use: No    Alcohol/week: 0.0 standard drinks   Drug use: No   Sexual activity: Not on file  Other Topics Concern   Not on  file  Social History Narrative   Retired Licensed conveyancer      Exercise: walking      Lives at home with her husband.   Right-handed.   2-3 cups coffee per day.   Social Determinants of Health   Financial Resource Strain: Low Risk    Difficulty of Paying Living Expenses: Not hard at all  Food Insecurity: No Food Insecurity   Worried About Charity fundraiser in the Last Year: Never true   Ran Out of Food in the Last Year: Never true  Transportation Needs: No Data processing manager (Medical): No   Lack of Transportation (Non-Medical): No  Physical Activity: Inactive   Days of Exercise per Week: 0 days   Minutes of Exercise per Session: 0 min  Stress: No Stress Concern Present   Feeling of Stress : Not at all  Social Connections: Not on file    Tobacco Counseling Counseling given: Not Answered   Clinical Intake:  Pre-visit preparation completed: Yes  Pain : No/denies pain Pain Score: 0-No pain     BMI - recorded: 19.46 Nutritional Status: BMI of 19-24  Normal Diabetes: No  How often do you need to have someone help you when you read instructions, pamphlets, or other written materials from your doctor or pharmacy?: 1 - Never What is the last grade level you completed in school?: HSG  Diabetic? no  Interpreter Needed?: No  Information entered by :: Lisette Abu, LPN.   Activities of Daily Living    11/09/2021   12:13 PM  In your present state of health, do you have any difficulty performing the following activities:  Hearing? 0  Vision? 0  Difficulty concentrating or making decisions? 1  Walking or climbing stairs? 0  Dressing or bathing? 1  Doing errands, shopping? 1  Preparing Food and eating ? Y  Using the Toilet? Y  In the past six months, have you accidently leaked urine? Y  Do you have problems with loss of bowel control? Y  Managing your Medications? Y  Managing your Finances? Y  Housekeeping or managing your Housekeeping?  Y    Patient Care Team: [name], MD as PCP - General (Internal Medicine) Ladell Pier, MD as Attending Physician (Internal Medicine) Arloa Koh, MD (Inactive) (Radiation Oncology) Webb Laws, [place] as Referring Physician (Optometry)  Indicate any recent Medical Services you may have received from other than [facility] providers in the past year (date may be approximate).     Assessment:   This is a routine wellness examination for [place].  Hearing/Vision screen Hearing Screening - Comments:: Patient denied any hearing difficulty.   No hearing aids.  Vision Screening - Comments:: Patient does wear corrective lenses/contacts.  Eye exam done by: Howard McFarland, OD.   Dietary issues and exercise activities discussed: Current Exercise Habits: The patient does not participate in regular exercise at present, Exercise limited by: neurologic condition(s)   Goals Addressed   None   Depression Screen    11/09/2021   11:50 AM 02/09/2020   11:18 AM 02/03/2019    2:49 PM 12/12/2017    3:58 PM 12/11/2016    2:05 PM 12/11/2016    1:58 PM 10/20/2014    1:23 PM  PHQ 2/9 Scores  PHQ - 2 Score 0 0 0 0 0 0 0  PHQ- 9 Score    0 1      Fall Risk    11/09/2021   10:59 AM 02/09/2020   11:18 AM 02/03/2019    2:49 PM 12/12/2017    3:58 PM 12/11/2016    1:58 PM  [place] in the past year? 1 0 0 No No  Number falls in past yr: 1      Injury with Fall? 0      Risk for fall due to : No Fall Risks      Follow up Falls evaluation completed        [place]:  Any stairs in or  around the home? No  If so, are there any without handrails? No  Home free of loose throw rugs in walkways, pet beds, electrical cords, etc? Yes  Adequate lighting in your home to reduce risk of falls? Yes   ASSISTIVE DEVICES UTILIZED TO PREVENT FALLS:  Life alert? No  Use of a cane, walker or w/c? No  Grab bars in the bathroom? Yes  Shower chair or bench in shower?  Yes  Elevated toilet seat or a handicapped toilet? No   TIMED UP AND GO:  Was the test performed? Yes .  Length of time to ambulate 10 feet: n/a sec.   Appearance of gait: Patient not evaluated for gait during this visit.  Cognitive Function:    11/09/2021   12:17 PM 09/18/2020   10:49 AM 02/07/2020   10:43 AM 08/02/2019   10:24 AM 03/04/2018   11:57 AM  MMSE - Mini Mental State Exam  Not completed: Unable to complete      Orientation to time  '3 3 5 5  '$ Orientation to Place  '5 4 3 5  '$ Registration  '3 3 3 3  '$ Attention/ Calculation  0 '1 4 5  '$ Recall  '2 3 3 3  '$ Language- name 2 objects  '2 2 2 2  '$ Language- repeat  '1 1 1 1  '$ Language- follow 3 step command  '1 3 3 3  '$ Language- read & follow direction  '1 1 1 1  '$ Write a sentence  0 '1 1 1  '$ Copy design  1 0 1 1  Copy design-comments   10 animals    Total score  '19 22 27 30      '$ 04/02/2018    4:00 PM  Montreal Cognitive Assessment   Visuospatial/ Executive (0/5) 2  Naming (0/3) 3  Attention: Read list of digits (0/2) 2  Attention: Read list of letters (0/1) 0  Attention: Serial 7 subtraction starting at 100 (0/3) 2  Language: Repeat phrase (0/2) 1  Language : Fluency (0/1) 1  Abstraction (0/2) 0  Delayed Recall (0/5) 5  Orientation (0/6) 6  Total 22      Immunizations Immunization History  Administered Date(s) Administered   Influenza Split 03/18/2011   Influenza Whole 04/01/2007, 03/10/2008   Influenza, High Dose Seasonal PF 03/07/2015, 02/23/2016, 04/01/2017, 02/18/2018, 03/09/2019   Influenza,inj,quad, With Preservative 02/17/2014   Influenza-Unspecified 02/15/2013, 02/15/2014   PFIZER Comirnaty(Gray Top)Covid-19 Tri-Sucrose Vaccine 07/31/2019, 08/26/2019   Pneumococcal Conjugate-13 10/10/2015   Pneumococcal Polysaccharide-23 12/11/2016   Td 08/24/2008    TDAP status: Due, Education has been provided regarding the importance of this vaccine. Advised may receive this vaccine at local pharmacy or Health Dept. Aware to  provide a copy of the vaccination record if obtained from local pharmacy or Health Dept. Verbalized acceptance and understanding.  Flu Vaccine status: Due, Education has been provided regarding the importance of this vaccine. Advised may receive this vaccine at local pharmacy or Health Dept. Aware to provide a copy of the vaccination record if obtained from local pharmacy or Health Dept. Verbalized acceptance and understanding.  Pneumococcal vaccine status: Up to date  Covid-19 vaccine status: Completed vaccines  Qualifies for Shingles Vaccine? Yes   Zostavax completed No   Shingrix Completed?: No.    Education has been provided regarding the importance of this vaccine. Patient has been advised to call insurance company to determine out of pocket expense if they have not yet received this vaccine. Advised may also receive vaccine at local pharmacy  or Health Dept. Verbalized acceptance and understanding.  Screening Tests Health Maintenance  Topic Date Due   Zoster Vaccines- Shingrix (1 of 2) Never done   TETANUS/TDAP  08/25/2018   COVID-19 Vaccine (3 - Booster for Pfizer series) 10/21/2019   DEXA SCAN  02/08/2021   INFLUENZA VACCINE  01/15/2022   MAMMOGRAM  12/02/2022   COLONOSCOPY (Pts 45-62yr Insurance coverage will need to be confirmed)  11/24/2024   Pneumonia Vaccine 72 Years old  Completed   Hepatitis C Screening  Completed   HPV VACCINES  Aged Out    Health Maintenance  Health Maintenance Due  Topic Date Due   Zoster Vaccines- Shingrix (1 of 2) Never done   TETANUS/TDAP  08/25/2018   COVID-19 Vaccine (3 - Booster for Pfizer series) 10/21/2019   DEXA SCAN  02/08/2021    Colorectal cancer screening: Type of screening: Colonoscopy. Completed 11/25/2014. Repeat every 10 years  Mammogram status: Completed 01/01/2021. Repeat every year  Bone Density status: Completed 02/09/2019. Results reflect: Bone density results: OSTEOPENIA. Repeat every 2-3 years.  Lung Cancer Screening:  (Low Dose CT Chest recommended if Age 72-80years, 30 pack-year currently smoking OR have quit w/in 15years.) does not qualify.   Lung Cancer Screening Referral: no  Additional Screening:  Hepatitis C Screening: does qualify; Completed 12/25/2016  Vision Screening: Recommended annual ophthalmology exams for early detection of glaucoma and other disorders of the eye. Is the patient up to date with their annual eye exam?  Yes  Who is the provider or what is the name of the office in which the patient attends annual eye exams? Howard McFarland, OD. If pt is not established with a provider, would they like to be referred to a provider to establish care? No .   Dental Screening: Recommended annual dental exams for proper oral hygiene  Community Resource Referral / Chronic Care Management: CRR required this visit?  No   CCM required this visit?  No      Plan:     I have personally reviewed and noted the following in the patient's chart:   Medical and social history Use of alcohol, tobacco or illicit drugs  Current medications and supplements including opioid prescriptions.  Functional ability and status Nutritional status Physical activity Advanced directives List of other physicians Hospitalizations, surgeries, and ER visits in previous 12 months Vitals Screenings to include cognitive, depression, and falls Referrals and appointments  In addition, I have reviewed and discussed with patient certain preventive protocols, quality metrics, and best practice recommendations. A written personalized care plan for preventive services as well as general preventive health recommendations were provided to patient.     SSheral Flow LPN   [id number]  Nurse Notes:  Cognitive status assessed by direct observation.  Patient has current diagnosis of cognitive impairment. Patient is followed by neurology for ongoing assessment.  Patient is unable to complete screening 6CIT or MMSE.

## 2021-11-09 NOTE — Patient Instructions (Signed)
Shirley Melendez , Thank you for taking time to come for your Medicare Wellness Visit. I appreciate your ongoing commitment to your health goals. Please review the following plan we discussed and let me know if I can assist you in the future.   Screening recommendations/referrals: Colonoscopy: 11/25/2014; due every 10 years Mammogram: 01/01/2021; due every year Bone Density: 02/09/2019; due every 2-3 years Recommended yearly ophthalmology/optometry visit for glaucoma screening and checkup Recommended yearly dental visit for hygiene and checkup  Vaccinations: Influenza vaccine: due Fall 2023 Pneumococcal vaccine: 10/10/2015, 12/11/2016 Tdap vaccine: 08/24/2008; due every 10 years (overdue) Shingles vaccine: never done   Covid-19: 07/31/2019, 08/26/2019  Advanced directives: Yes; daughter if aware of wishes.  Conditions/risks identified: Yes  Next appointment: Please schedule your next Medicare Wellness Visit with your Nurse Health Advisor in 1 year by calling [phone].   Preventive Care 22 Years and Older, Female Preventive care refers to lifestyle choices and visits with your health care provider that can promote health and wellness. What does preventive care include? A yearly physical exam. This is also called an annual well check. Dental exams once or twice a year. Routine eye exams. Ask your health care provider how often you should have your eyes checked. Personal lifestyle choices, including: Daily care of your teeth and gums. Regular physical activity. Eating a healthy diet. Avoiding tobacco and drug use. Limiting alcohol use. Practicing safe sex. Taking low-dose aspirin every day. Taking vitamin and mineral supplements as recommended by your health care provider. What happens during an annual well check? The services and screenings done by your health care provider during your annual well check will depend on your age, overall health, lifestyle risk factors, and family history  of disease. Counseling  Your health care provider may ask you questions about your: Alcohol use. Tobacco use. Drug use. Emotional well-being. Home and relationship well-being. Sexual activity. Eating habits. History of falls. Memory and ability to understand (cognition). Work and work Statistician. Reproductive health. Screening  You may have the following tests or measurements: Height, weight, and BMI. Blood pressure. Lipid and cholesterol levels. These may be checked every 5 years, or more frequently if you are over 51 years old. Skin check. Lung cancer screening. You may have this screening every year starting at age 47 if you have a 30-pack-year history of smoking and currently smoke or have quit within the past 15 years. Fecal occult blood test (FOBT) of the stool. You may have this test every year starting at age 7. Flexible sigmoidoscopy or colonoscopy. You may have a sigmoidoscopy every 5 years or a colonoscopy every 10 years starting at age 67. Hepatitis C blood test. Hepatitis B blood test. Sexually transmitted disease (STD) testing. Diabetes screening. This is done by checking your blood sugar (glucose) after you have not eaten for a while (fasting). You may have this done every 1-3 years. Bone density scan. This is done to screen for osteoporosis. You may have this done starting at age 18. Mammogram. This may be done every 1-2 years. Talk to your health care provider about how often you should have regular mammograms. Talk with your health care provider about your test results, treatment options, and if necessary, the need for more tests. Vaccines  Your health care provider may recommend certain vaccines, such as: Influenza vaccine. This is recommended every year. Tetanus, diphtheria, and acellular pertussis (Tdap, Td) vaccine. You may need a Td booster every 10 years. Zoster vaccine. You may need this after age 89. Pneumococcal  13-valent conjugate (PCV13) vaccine. One  dose is recommended after age 76. Pneumococcal polysaccharide (PPSV23) vaccine. One dose is recommended after age 58. Talk to your health care provider about which screenings and vaccines you need and how often you need them. This information is not intended to replace advice given to you by your health care provider. Make sure you discuss any questions you have with your health care provider. Document Released: 06/30/2015 Document Revised: 02/21/2016 Document Reviewed: 04/04/2015 Elsevier Interactive Patient Education  2017 [employer] Prevention in the Home Falls can cause injuries. They can happen to people of all ages. There are many things you can do to make your home safe and to help prevent falls. What can I do on the outside of my home? Regularly fix the edges of walkways and driveways and fix any cracks. Remove anything that might make you trip as you walk through a door, such as a raised step or threshold. Trim any bushes or trees on the path to your home. Use bright outdoor lighting. Clear any walking paths of anything that might make someone trip, such as rocks or tools. Regularly check to see if handrails are loose or broken. Make sure that both sides of any steps have handrails. Any raised decks and porches should have guardrails on the edges. Have any leaves, snow, or ice cleared regularly. Use sand or salt on walking paths during winter. Clean up any spills in your garage right away. This includes oil or grease spills. What can I do in the bathroom? Use night lights. Install grab bars by the toilet and in the tub and shower. Do not use towel bars as grab bars. Use non-skid mats or decals in the tub or shower. If you need to sit down in the shower, use a plastic, non-slip stool. Keep the floor dry. Clean up any water that spills on the floor as soon as it happens. Remove soap buildup in the tub or shower regularly. Attach bath mats securely with double-sided  non-slip rug tape. Do not have throw rugs and other things on the floor that can make you trip. What can I do in the bedroom? Use night lights. Make sure that you have a light by your bed that is easy to reach. Do not use any sheets or blankets that are too big for your bed. They should not hang down onto the floor. Have a firm chair that has side arms. You can use this for support while you get dressed. Do not have throw rugs and other things on the floor that can make you trip. What can I do in the kitchen? Clean up any spills right away. Avoid walking on wet floors. Keep items that you use a lot in easy-to-reach places. If you need to reach something above you, use a strong step stool that has a grab bar. Keep electrical cords out of the way. Do not use floor polish or wax that makes floors slippery. If you must use wax, use non-skid floor wax. Do not have throw rugs and other things on the floor that can make you trip. What can I do with my stairs? Do not leave any items on the stairs. Make sure that there are handrails on both sides of the stairs and use them. Fix handrails that are broken or loose. Make sure that handrails are as long as the stairways. Check any carpeting to make sure that it is firmly attached to the stairs. Fix any carpet  that is loose or worn. Avoid having throw rugs at the top or bottom of the stairs. If you do have throw rugs, attach them to the floor with carpet tape. Make sure that you have a light switch at the top of the stairs and the bottom of the stairs. If you do not have them, ask someone to add them for you. What else can I do to help prevent falls? Wear shoes that: Do not have high heels. Have rubber bottoms. Are comfortable and fit you well. Are closed at the toe. Do not wear sandals. If you use a stepladder: Make sure that it is fully opened. Do not climb a closed stepladder. Make sure that both sides of the stepladder are locked into place. Ask  someone to hold it for you, if possible. Clearly mark and make sure that you can see: Any grab bars or handrails. First and last steps. Where the edge of each step is. Use tools that help you move around (mobility aids) if they are needed. These include: Canes. Walkers. Scooters. Crutches. Turn on the lights when you go into a dark area. Replace any light bulbs as soon as they burn out. Set up your furniture so you have a clear path. Avoid moving your furniture around. If any of your floors are uneven, fix them. If there are any pets around you, be aware of where they are. Review your medicines with your doctor. Some medicines can make you feel dizzy. This can increase your chance of falling. Ask your doctor what other things that you can do to help prevent falls. This information is not intended to replace advice given to you by your health care provider. Make sure you discuss any questions you have with your health care provider. Document Released: 03/30/2009 Document Revised: 11/09/2015 Document Reviewed: 07/08/2014 Elsevier Interactive Patient Education  2017 [employer].

## 2021-11-09 NOTE — Assessment & Plan Note (Signed)
Chronic Very low risk No restrictions on diet given low weight and dementia

## 2021-11-14 ENCOUNTER — Other Ambulatory Visit: Payer: Self-pay | Admitting: Internal Medicine

## 2021-11-14 DIAGNOSIS — I10 Essential (primary) hypertension: Secondary | ICD-10-CM

## 2021-11-19 ENCOUNTER — Encounter: Payer: Self-pay | Admitting: Neurology

## 2021-11-19 DIAGNOSIS — F0390 Unspecified dementia without behavioral disturbance: Secondary | ICD-10-CM

## 2021-11-20 NOTE — Telephone Encounter (Signed)
Orders Placed This Encounter  Procedures   EEG adult   Ordered EEG, please move up her follow-up appointment, to my next available

## 2021-11-29 ENCOUNTER — Encounter: Payer: Self-pay | Admitting: Neurology

## 2021-12-03 ENCOUNTER — Encounter: Payer: Self-pay | Admitting: *Deleted

## 2021-12-03 ENCOUNTER — Ambulatory Visit: Payer: Medicare PPO | Admitting: Neurology

## 2021-12-03 DIAGNOSIS — F0390 Unspecified dementia without behavioral disturbance: Secondary | ICD-10-CM

## 2021-12-03 DIAGNOSIS — R4182 Altered mental status, unspecified: Secondary | ICD-10-CM | POA: Diagnosis not present

## 2021-12-04 DIAGNOSIS — Z0289 Encounter for other administrative examinations: Secondary | ICD-10-CM

## 2021-12-06 ENCOUNTER — Encounter: Payer: Self-pay | Admitting: Neurology

## 2021-12-06 ENCOUNTER — Ambulatory Visit: Payer: Medicare PPO | Admitting: Neurology

## 2021-12-06 VITALS — BP 105/70 | HR 98 | Ht 61.0 in | Wt 104.6 lb

## 2021-12-06 DIAGNOSIS — G309 Alzheimer's disease, unspecified: Secondary | ICD-10-CM

## 2021-12-06 DIAGNOSIS — F0280 Dementia in other diseases classified elsewhere without behavioral disturbance: Secondary | ICD-10-CM | POA: Diagnosis not present

## 2021-12-06 NOTE — Procedures (Signed)
   HISTORY: 72 year old female, with advanced dementia, episode of immobile, eye closure, no seizure-like activity.  TECHNIQUE:  This is a routine 16 channel EEG recording with one channel devoted to a limited EKG recording.  It was performed during wakefulness, drowsiness and asleep.  Photic stimulation were performed as activating procedures.  There are minimum muscle and movement artifact noted.  Upon maximum arousal, posterior dominant waking rhythm consistent of dysrhythmic theta range activity, activities are symmetric over the bilateral posterior derivations and less reactive to eye opening and closure.  Hyperventilation was not performed  Photic stimulation did not alter the tracing.  During EEG recording, there was no epileptiform discharge noted.  EKG demonstrate sinus rhythm, with heart rate of 88 bpm  CONCLUSION: This is an abnormal EEG, there is evidence of moderate generalized diffuse slowing, suggestive of bihemispheric malfunction, consistent with her advanced dementia.  There is no evidence of epileptiform discharge.  Marcial Pacas, M.D. Ph.D.  [facility] [place], [place] [address] Phone: [phone] Fax:      [phone]

## 2021-12-06 NOTE — Progress Notes (Signed)
ASSESSMENT AND PLAN 71 y.o. year old female    Dementia  Most consistent with central nervous system degenerative disorder, Alzheimer's disease  Continue to decline relentlessly, affecting all expect of her cognitive function, needing help in daily activity, has developed gait abnormality, urinary occasionally bowel incontinence, also episode of eye closing, not moving, but no seizure-like activity,  -EEG showed generalized slowing, no epileptiform discharge  Likely she will continue to progress, needing more help, likely a good candidate for palliative care    DIAGNOSTIC DATA (LABS, IMAGING, TESTING) - I reviewed patient records, labs, notes, testing and imaging myself where available.    HISTORY OF PRESENT ILLNESS: Today 12/06/21  HISTORY Shirley Melendez is a 72 year old female, seen in request by [name], [name] for evaluation of memory loss, she is accompanied by her [name] at today's clinical visit, initial evaluation was on March 04, 2018.   I have reviewed and summarized the referring note from the referring physician, she had a history of left breast cancer, [name], stage I left-sided breast cancer in April 1997, treated with lobectomy radiation, CMF chemotherapy, 5 years of tamoxifen, metastatic carcinoma involving left axillary lymph node, had left axillary lymph node dissection on September 13, 2010, status post 4 cycle adjuvant TC chemotherapy, completed on January 07, 2011, begin Arimidex in December 2012, left arm lymphedema,   She is a retired Art therapist, enjoys reading, had a sudden onset memory change since May 2019, she has chronic insomnia, was given a prescription of trazodone in May 2019 to June 2019, which did not help her sleep much, but during that period of time, she began to have spells of hallucinations, seeing people at her house, woke up in the middle of the night confused, staring at her husband could not  recognize him, spell lasted for few minutes, intermittent,   Since trazodone was stopped, she remains symptomatic, sometimes while eating breakfast, she is confused where she is, could not recognize her husband   UPDATE Apr 02 2018: She is accompanied by her husband and daughter [name] at today's visit, we personally reviewed MRI of the brain with without contrast on March 21, 2018, there is evidence of generalized atrophy, progressed compared to previous CT scan in 2010, mild supratentorium small vessel disease.   She has stopped trazodone since August 2019, she continue have episode of confusion, she gave me an example, one afternoon she came back from outside to her house, saw a man she seems to know before trying explain to her that she married to him for 41 years, there are three different mans, husband explained that it was him trying to explains to her what is going on, she was confused.   She denies significant memory loss, but has intermittent confusion spells,   Laboratory evaluations showed elevated B12 1698, mild elevated creatinine 1.02, elevated total cholesterol 227, LDL 128, normal CBC, TSH, A1c was 5.8,   Virtual Visit via telephone on October 07 2018   She is with her husband during the interview, reviewed her medication list, she is only taking arimidex '1mg'$  daily, cal-Vit D, cymbalta '60mg'$  daily, lysine '500mg'$  prn, metoprolol '50mg'$  daily, MVI, spironolactone '25mg'$  daily. Vit E.   She has tried aricept, complains of dizziness and GI side effect, has vivid nightmares, falling off her bed during sleep, never started on namenda, was not able to complete neuropsychiatric evaluations,    She is doing better, she eats well,  sleeps well now, she still cooks, work on plant in her garden, likes to read, fiction, historical stories. She is not exercise regularly.  She does not have hallucinations, occasionally floaters.    Laboratory evaluations in Jan 2020, Vit B12 1170, BMP, Creat 1.16,  normal CBC   UPDATE Aug 02 2019: She is accompanied by her daughter at today's clinical visit, she continues to decline slowly, there was intermittent episode of confusion, to the point that she could not recognize her husband, repeating questions, she is often frustrated about it.  She has good appetite, she denies difficulty moving, was relatively isolated during winter months and COVID-19.  UPDATE Apr 09 2021: She is accompanied by her daughter at today's clinical visit, her memory loss continued decline, word finding difficulties in completing her sentence, difficulty falling 3 steps command, she is very frustrated with Mini-Mental status/MoCA examination, does not want to cooperative on the exam, difficulty following verbal command,  Daughter is also concerned that her husband has difficulty to let the help into their house, concerned that her husband might dealing with cognitive issues  Patient was noted to have decreased appetite, daughter want to give her a chance to try lower dose of Exelon and Namenda, was not sure about the benefit anyway  She has occasional urinary incontinence, frequent awakening at nighttime, got lost on her way to the bathroom,  UPDATE December 06 2021: Accompanied by [name] at today's clinical visit, patient to continue to progress relentlessly, she has more memory loss, could no longer carry on meaningful conversation, high risk for fall, developed bowel and bladder incontinence  She lives with her husband at home, daughter reported that her husband also developed mild cognitive impairment, difficult to accept help in-house, she is getting few hours help at home, but with worsening difficulty,   EEG showed moderate generalized slowing, no epileptiform discharge   PHYSICAL EXAM  Vitals:   04/09/21 1129  BP: 128/72  Pulse: 99  Weight: 102 lb (46.3 kg)  Height: '5\' 1"'$  (1.549 m)   Body mass index is 19.27 kg/m. MMSE - Mini Mental State Exam  09/18/2020 02/07/2020 08/02/2019  Orientation to time '3 3 5  '$ Orientation to Place '5 4 3  '$ Registration '3 3 3  '$ Attention/ Calculation 0 1 4  Recall '2 3 3  '$ Language- name 2 objects '2 2 2  '$ Language- repeat '1 1 1  '$ Language- follow 3 step command '1 3 3  '$ Language- read & follow direction '1 1 1  '$ Write a sentence 0 1 1  Copy design 1 0 1  Copy design-comments - 10 animals -  Total score '19 22 27    '$ PHYSICAL EXAMNIATION:  Gen: NAD, conversant, well nourised, well groomed                     Cardiovascular: Regular rate rhythm, no peripheral edema, warm, nontender. Eyes: Conjunctivae clear without exudates or hemorrhage Neck: Supple, no carotid bruits. Pulmonary: Clear to auscultation bilaterally   NEUROLOGICAL EXAM:  MENTAL STATUS: Speech/cognition: Awake, comprehension difficulty, not follow command, no dysarthria, but aphasia, or talking does not make sense anymore,  CRANIAL NERVES: CN II: Pupils reactive to light CN III, IV, VI: extraocular movement are normal. No ptosis. CN V: Facial sensation is intact to pinprick in all 3 divisions bilaterally. Corneal responses are intact.  CN VII: Face is symmetric with normal eye closure and smile. CN VIII: Hearing is normal to casual conversation CN IX, X: Palate  elevates symmetrically. Phonation is normal. CN XI: Head turning and shoulder shrug are intact CN XII: Tongue is midline with normal movements and no atrophy.  MOTOR: Moving 4 extremities without difficulty  REFLEXES: Hypoactive and symmetric   COORDINATION: No truncal ataxia or limb dysmetria noted,  GAIT/STANCE: She can get up by pushing on chair, but often stop in the middle of the walking, difficulty to be redirected, rigid    REVIEW OF SYSTEMS: Out of a complete 14 system review of symptoms, the patient complains only of the following symptoms, and all other reviewed systems are negative.  Memory loss   ALLERGIES: Allergies  Allergen Reactions   Compazine Other (See  Comments)    Muscle spasms in neck & back with jaw tightness   Donepezil Other (See Comments)    Upset stomach   Trazodone And Nefazodone     Confusion and hallucinations   Erythromycin Nausea And Vomiting   Prochlorperazine Edisylate     nausea    HOME MEDICATIONS: Outpatient Medications Prior to Visit  Medication Sig Dispense Refill   anastrozole (ARIMIDEX) 1 MG tablet TAKE ONE TABLET BY MOUTH DAILY 90 tablet 3   Calcium Carbonate-Vitamin D (CALCIUM 600 + D PO) Take 1 capsule by mouth 2 (two) times daily. 600/400     DULoxetine (CYMBALTA) 60 MG capsule TAKE ONE CAPSULE BY MOUTH DAILY 90 capsule 0   LYSINE PO Take 500 mg by mouth as needed.     Multiple Vitamins-Minerals (SENIOR MULTIVITAMIN PLUS PO) Take by mouth daily.     NON FORMULARY Antifungal cream from [place]     spironolactone (ALDACTONE) 25 MG tablet TAKE ONE TABLET BY MOUTH DAILY 90 tablet 1   UNABLE TO FIND Place 1-2 g onto the skin as needed (peripheral neuropathy). Transdermal Therapeutics (240 gram tube) cream: amantadine 8%, baclofen 2%, gabapentin 6%, amitriptyline 4%, bupivacaine 2%, clonidine 0.2% (1-2 grams every 6-8 hours as needed).     memantine (NAMENDA) 10 MG tablet Take 1 tablet (10 mg total) by mouth 2 (two) times daily. 180 tablet 4   rivastigmine (EXELON) 3 MG capsule Take 1 capsule (3 mg total) by mouth 2 (two) times daily. 60 capsule 5   Vitamin E 200 UNITS TABS Take 400 Units by mouth daily.      No facility-administered medications prior to visit.    PAST MEDICAL HISTORY: Past Medical History:  Diagnosis Date   Arthritis    chronic neck and back pain   Cervicalgia 04/12/2009   Gets massage once a month; related to arthritis     CTS (carpal tunnel syndrome) 2013   Essential hypertension, benign 07/16/2013   Fasting hyperglycemia 2012   FBS 110   High serum vitamin B12 04/02/2018   History of breast cancer; left 1997 and 2012   Patient diagnosed with invasive ductal carcinoma on  10/14/95 after undergoing excision of left breast mass. Patient then underwent left partial mastectomy on 10/30/95. Pathology showed two foci of invasive ductal carcinoma, grade 1. Patient then underwent a needle biopsy of a left axillary lymph node on 08/13/10 which showed metastatic carcinoma. She underwent left axillary node dissection on 09/13/10   History of chemotherapy 1997   History of radiation therapy 1997   x2   Hyperlipidemia    Left arm swelling 2012   lymphadema from axillary dissection, wears compression sleeve   Lower back pain 04/12/2009   Related to scoliosis   Lymphedema of upper extremity following lymphadenectomy 11/07/2011   Major neurocognitive  disorder 02/07/2020   Unclear etiology   MRSA (methicillin resistant staph aureus) culture positive 2012   swab was positive, treated per protocol.  No other issues.   Neuropathy due to chemotherapeutic drug 02/18/2018   Osteoarthritis 08/24/2008   Neck, back, knee     Osteopenia    PONV (postoperative nausea and vomiting) 1997   no problems since   Prediabetes 12/11/2016    PAST SURGICAL HISTORY: Past Surgical History:  Procedure Laterality Date   AXILLARY NODE DISSECTION  08/2010   left   AXILLARY NODE DISSECTION Left 2012   with axillary dissection on left   BREAST BIOPSY  2012   x2   BREAST LUMPECTOMY  09/1995, 10/1995   left - lumpectomy    BREAST LUMPECTOMY  2012   Lumpectomy Axilla    BREAST SURGERY  2012    axillary node dissection left   CARPAL TUNNEL RELEASE  10/29/2011   Procedure: CARPAL TUNNEL RELEASE;  Surgeon: Cammie Sickle., MD;  Location: [facility];  Service: Orthopedics;  Laterality: Right;   cataract surgery  Bilateral 2020   COLONOSCOPY  2005   negative   DILATION AND CURETTAGE, DIAGNOSTIC / THERAPEUTIC  2002   KNEE SURGERY  2000   left   TRIGGER FINGER RELEASE  10/29/2011   Procedure: RELEASE TRIGGER FINGER/A-1 PULLEY;  Surgeon: Cammie Sickle., MD;  Location: [facility];  Service: Orthopedics;  Laterality: Right;  right long and right index     FAMILY HISTORY: Family History  Problem Relation Age of Onset   Hypertension Mother    Arthritis Mother        OA   Dementia Mother    Other Father        unknown medical history   Arthritis Maternal Grandfather        OA   Heart attack Maternal Grandfather 87   Dementia Maternal Grandfather    Heart attack Maternal Uncle 65   Dementia Maternal Grandmother    Stroke Neg Hx    Diabetes Neg Hx    Breast cancer Neg Hx     SOCIAL HISTORY: Social History   Socioeconomic History   Marital status: Married    Spouse name: Not on file   Number of children: 1   Years of education: 18   Highest education level: Master's degree (e.g., MA, MS, MEng, MEd, MSW, MBA)  Occupational History   Occupation: Retired  Tobacco Use   Smoking status: Never   Smokeless tobacco: Never  Vaping Use   Vaping Use: Never used  Substance and Sexual Activity   Alcohol use: No    Alcohol/week: 0.0 standard drinks of alcohol   Drug use: No   Sexual activity: Not on file  Other Topics Concern   Not on file  Social History Narrative   Retired Licensed conveyancer      Exercise: walking      Lives at home with her husband.   Right-handed.   2-3 cups coffee per day.   Social Determinants of Health   Financial Resource Strain: Low Risk  (11/09/2021)   Overall Financial Resource Strain (CARDIA)    Difficulty of Paying Living Expenses: Not hard at all  Food Insecurity: No Food Insecurity (11/09/2021)   Hunger Vital Sign    Worried About Running Out of Food in the Last Year: Never true    Ran Out of Food in the Last Year: Never true  Transportation Needs: No Transportation Needs (  11/09/2021)   PRAPARE - Hydrologist (Medical): No    Lack of Transportation (Non-Medical): No  Physical Activity: Inactive (11/09/2021)   Exercise Vital Sign    Days of Exercise per Week: 0 days    Minutes of  Exercise per Session: 0 min  Stress: No Stress Concern Present (11/09/2021)   [place]    Feeling of Stress : Not at all  Social Connections: [place] (12/12/2017)   Social Connection and Isolation Panel [NHANES]    Frequency of Communication with Friends and Family: More than three times a week    Frequency of Social Gatherings with Friends and Family: More than three times a week    Attends Religious Services: More than 4 times per year    Active Member of [employer] or Organizations: Yes    Attends Music therapist: More than 4 times per year    Marital Status: Married  Human resources officer Violence: Not At Risk (11/09/2021)   Humiliation, Afraid, Rape, and Kick questionnaire    Fear of Current or Ex-Partner: No    Emotionally Abused: No    Physically Abused: No    Sexually Abused: No    Total time spent reviewing the chart, obtaining history, examined patient, ordering tests, documentation, consultations and family, care coordination was 74 minutes    Marcial Pacas, M.D. Ph.D.  [facility] [place], [place] [address] Phone: [phone] Fax:      [phone]

## 2021-12-12 ENCOUNTER — Emergency Department (HOSPITAL_COMMUNITY): Payer: Medicare PPO

## 2021-12-12 ENCOUNTER — Encounter (HOSPITAL_COMMUNITY): Payer: Self-pay

## 2021-12-12 ENCOUNTER — Emergency Department (HOSPITAL_COMMUNITY)
Admission: EM | Admit: 2021-12-12 | Discharge: 2021-12-13 | Disposition: A | Discharge status: Home / Self Care | Payer: Medicare PPO | Attending: Emergency Medicine | Admitting: Emergency Medicine

## 2021-12-12 ENCOUNTER — Other Ambulatory Visit: Payer: Self-pay

## 2021-12-12 DIAGNOSIS — R4182 Altered mental status, unspecified: Secondary | ICD-10-CM | POA: Diagnosis not present

## 2021-12-12 DIAGNOSIS — I10 Essential (primary) hypertension: Secondary | ICD-10-CM | POA: Diagnosis not present

## 2021-12-12 DIAGNOSIS — Z20822 Contact with and (suspected) exposure to covid-19: Secondary | ICD-10-CM | POA: Diagnosis not present

## 2021-12-12 DIAGNOSIS — F03C18 Unspecified dementia, severe, with other behavioral disturbance: Secondary | ICD-10-CM

## 2021-12-12 DIAGNOSIS — Z79899 Other long term (current) drug therapy: Secondary | ICD-10-CM | POA: Insufficient documentation

## 2021-12-12 DIAGNOSIS — F03918 Unspecified dementia, unspecified severity, with other behavioral disturbance: Secondary | ICD-10-CM | POA: Diagnosis not present

## 2021-12-12 DIAGNOSIS — S0990XA Unspecified injury of head, initial encounter: Secondary | ICD-10-CM | POA: Diagnosis not present

## 2021-12-12 DIAGNOSIS — R509 Fever, unspecified: Secondary | ICD-10-CM | POA: Diagnosis not present

## 2021-12-12 DIAGNOSIS — Z853 Personal history of malignant neoplasm of breast: Secondary | ICD-10-CM | POA: Diagnosis not present

## 2021-12-12 DIAGNOSIS — F0390 Unspecified dementia without behavioral disturbance: Secondary | ICD-10-CM | POA: Insufficient documentation

## 2021-12-12 DIAGNOSIS — E785 Hyperlipidemia, unspecified: Secondary | ICD-10-CM | POA: Diagnosis not present

## 2021-12-12 DIAGNOSIS — G319 Degenerative disease of nervous system, unspecified: Secondary | ICD-10-CM | POA: Diagnosis not present

## 2021-12-12 DIAGNOSIS — R000 Tachycardia, unspecified: Secondary | ICD-10-CM | POA: Diagnosis not present

## 2021-12-12 DIAGNOSIS — I739 Peripheral vascular disease, unspecified: Secondary | ICD-10-CM | POA: Diagnosis not present

## 2021-12-12 LAB — CBC WITH DIFFERENTIAL/PLATELET
Abs Immature Granulocytes: 0.02 10*3/uL (ref 0.00–0.07)
Basophils Absolute: 0 10*3/uL (ref 0.0–0.1)
Basophils Relative: 0 %
Eosinophils Absolute: 0.1 10*3/uL (ref 0.0–0.5)
Eosinophils Relative: 1 %
HCT: 43.2 % (ref 36.0–46.0)
Hemoglobin: 15.2 g/dL — ABNORMAL HIGH (ref 12.0–15.0)
Immature Granulocytes: 0 %
Lymphocytes Relative: 16 %
Lymphs Abs: 1.2 10*3/uL (ref 0.7–4.0)
MCH: 32.3 pg (ref 26.0–34.0)
MCHC: 35.2 g/dL (ref 30.0–36.0)
MCV: 91.9 fL (ref 80.0–100.0)
Monocytes Absolute: 0.8 10*3/uL (ref 0.1–1.0)
Monocytes Relative: 10 %
Neutro Abs: 5.6 10*3/uL (ref 1.7–7.7)
Neutrophils Relative %: 73 %
Platelets: 309 10*3/uL (ref 150–400)
RBC: 4.7 MIL/uL (ref 3.87–5.11)
RDW: 12.4 % (ref 11.5–15.5)
WBC: 7.7 10*3/uL (ref 4.0–10.5)
nRBC: 0 % (ref 0.0–0.2)

## 2021-12-12 LAB — COMPREHENSIVE METABOLIC PANEL
ALT: 17 U/L (ref 0–44)
AST: 15 U/L (ref 15–41)
Albumin: 3.7 g/dL (ref 3.5–5.0)
Alkaline Phosphatase: 61 U/L (ref 38–126)
Anion gap: 9 (ref 5–15)
BUN: 18 mg/dL (ref 8–23)
CO2: 24 mmol/L (ref 22–32)
Calcium: 9.1 mg/dL (ref 8.9–10.3)
Chloride: 104 mmol/L (ref 98–111)
Creatinine, Ser: 0.73 mg/dL (ref 0.44–1.00)
GFR, Estimated: >60 mL/min (ref >60)
Glucose, Bld: 114 mg/dL — ABNORMAL HIGH (ref 70–99)
Potassium: 3.8 mmol/L (ref 3.5–5.1)
Sodium: 137 mmol/L (ref 135–145)
Total Bilirubin: 0.8 mg/dL (ref 0.3–1.2)
Total Protein: 6.3 g/dL — ABNORMAL LOW (ref 6.5–8.1)

## 2021-12-12 LAB — URINALYSIS, ROUTINE W REFLEX MICROSCOPIC
Bilirubin Urine: NEGATIVE
Glucose, UA: NEGATIVE mg/dL
Hgb urine dipstick: NEGATIVE
Ketones, ur: NEGATIVE mg/dL
Leukocytes,Ua: NEGATIVE
Nitrite: NEGATIVE
Protein, ur: NEGATIVE mg/dL
Specific Gravity, Urine: 1.01 (ref 1.005–1.030)
pH: 6 (ref 5.0–8.0)

## 2021-12-12 LAB — LACTIC ACID, PLASMA
Lactic Acid, Venous: 1.3 mmol/L (ref 0.5–1.9)
Lactic Acid, Venous: 0.8 mmol/L (ref 0.5–1.9)

## 2021-12-12 LAB — RESP PANEL BY RT-PCR (FLU A&B, COVID) ARPGX2
Influenza A by PCR: NEGATIVE
Influenza B by PCR: NEGATIVE
SARS Coronavirus 2 by RT PCR: NEGATIVE

## 2021-12-12 LAB — PROTIME-INR
INR: 1 (ref 0.8–1.2)
Prothrombin Time: 13 seconds (ref 11.4–15.2)

## 2021-12-12 LAB — APTT: aPTT: 26 seconds (ref 24–36)

## 2021-12-12 MED ORDER — LACTATED RINGERS IV SOLN: INTRAVENOUS | Status: DC

## 2021-12-12 MED ORDER — LACTATED RINGERS IV BOLUS (SEPSIS)
1000.0000 mL | Freq: Once | INTRAVENOUS | Status: AC
  Administered 2021-12-12: 1000 mL via INTRAVENOUS

## 2021-12-12 MED ORDER — HALOPERIDOL LACTATE 5 MG/ML IJ SOLN
2.0000 mg | Freq: Once | INTRAMUSCULAR | Status: AC
Start: 2021-12-12 — End: 2021-12-12
  Administered 2021-12-12: 2 mg via INTRAVENOUS
  Filled 2021-12-12: qty 1

## 2021-12-12 NOTE — Discharge Instructions (Addendum)
All the labs are normal today.  She does not have any infection and no signs of bleeding in the brain.  It seems that the episode may be behavioral.  She will be sleepy today with the haldol.  Make sure someone is assisting her when trying to get up for the next 12 hours.

## 2021-12-12 NOTE — ED Triage Notes (Signed)
Patient BIB EMS from home. Patient family states that patient is has become more altered over the last two days. Patient has a hx of dementia. Patient has not complaints at this time.

## 2021-12-12 NOTE — Sepsis Progress Note (Signed)
Monitoring for the code sepsis protocol. °

## 2021-12-12 NOTE — ED Provider Notes (Signed)
[facility] DEPT Provider Note   CSN: [id number] Arrival date & time: 12/12/21  1813     History  No chief complaint on file.   Shirley Melendez is a 71 y.o. female.  Patient is a 71 year old female with a history of progressive cognitive decline, hypertension, prior breast cancer and hyperlipidemia who is presenting today with EMS due to change in mental status.  Patient is not able to give any history.  When speaking with the husband he reports that patient was fine today until they went in from the porch and she laid down and would not open her eyes or respond to him.  However concern that patient's husband may have some mild mental decline as well.  EMS reported that patient had been more altered over the last 2 days but no further history is available at this time.  Husband had denied any nausea vomiting, appetite changes, cough or known fever.  She did not have any falls today.  She was not shaking or displaying any seizure-like activity at home.  He reports at no time did she stop breathing.  The history is provided by the EMS personnel and the spouse.       Home Medications Prior to Admission medications   Medication Sig Start Date End Date Taking? Authorizing Provider  anastrozole (ARIMIDEX) 1 MG tablet TAKE ONE TABLET BY MOUTH DAILY Patient taking differently: Take 1 mg by mouth daily. 02/01/21   Ladell Pier, MD  Calcium Carbonate-Vitamin D (CALCIUM 600 + D PO) Take 1 capsule by mouth 2 (two) times daily. 600/400    [provider]  DULoxetine (CYMBALTA) 60 MG capsule TAKE ONE CAPSULE BY MOUTH DAILY 11/14/21   [name], MD  LYSINE PO Take 500 mg by mouth as needed.    [provider]  Multiple Vitamins-Minerals (SENIOR MULTIVITAMIN PLUS PO) Take by mouth daily.    [provider]  NON FORMULARY Antifungal cream from [place], Historical, MD  spironolactone (ALDACTONE) 25 MG tablet TAKE ONE  TABLET BY MOUTH DAILY Patient taking differently: Take 25 mg by mouth daily. 11/14/21   Burns, Claudina Lick, MD  UNABLE TO FIND Place 1-2 g onto the skin as needed (peripheral neuropathy). Transdermal Therapeutics (240 gram tube) cream: amantadine 8%, baclofen 2%, gabapentin 6%, amitriptyline 4%, bupivacaine 2%, clonidine 0.2% (1-2 grams every 6-8 hours as needed).    [provider]      Allergies    Compazine, Donepezil, Trazodone and nefazodone, Erythromycin, and Prochlorperazine edisylate    Review of Systems   Review of Systems  Physical Exam Updated Vital Signs BP 130/77   Pulse (!) 107   Temp 99.3 F (37.4 C) (Rectal)   Resp 20   Ht '5\' 1"'$  (1.549 m)   Wt 48 kg   SpO2 98%   BMI 19.99 kg/m  Physical Exam Vitals and nursing note reviewed.  Constitutional:      General: She is not in acute distress.    Appearance: She is well-developed.  HENT:     Head: Normocephalic and atraumatic.  Eyes:     Pupils: Pupils are equal, round, and reactive to light.  Cardiovascular:     Rate and Rhythm: Regular rhythm. Tachycardia present.     Heart sounds: Normal heart sounds. No murmur heard.    No friction rub.  Pulmonary:     Effort: Pulmonary effort is normal.     Breath sounds: Normal breath  sounds. No wheezing or rales.  Abdominal:     General: Bowel sounds are normal. There is no distension.     Palpations: Abdomen is soft.     Tenderness: There is no abdominal tenderness. There is no guarding or rebound.  Musculoskeletal:        General: No tenderness. Normal range of motion.     Comments: No edema  Skin:    General: Skin is warm and dry.     Findings: No rash.  Neurological:     Mental Status: She is alert.     Cranial Nerves: No cranial nerve deficit.     Comments: Patient is not able to carry on meaningful conversation but can follow commands  Psychiatric:     Comments: Calm and cooperative but intermittently agitated trying to crawl out of bed     ED Results  / Procedures / Treatments   Labs (all labs ordered are listed, but only abnormal results are displayed) Labs Reviewed  COMPREHENSIVE METABOLIC PANEL - Abnormal; Notable for the following components:      Result Value   Glucose, Bld 114 (*)    Total Protein 6.3 (*)    All other components within normal limits  CBC WITH DIFFERENTIAL/PLATELET - Abnormal; Notable for the following components:   Hemoglobin 15.2 (*)    All other components within normal limits  RESP PANEL BY RT-PCR (FLU A&B, COVID) ARPGX2  CULTURE, BLOOD (ROUTINE X 2)  CULTURE, BLOOD (ROUTINE X 2)  URINE CULTURE  LACTIC ACID, PLASMA  LACTIC ACID, PLASMA  URINALYSIS, ROUTINE W REFLEX MICROSCOPIC  PROTIME-INR  APTT  CBC WITH DIFFERENTIAL/PLATELET    EKG None  Radiology DG Chest Port 1 View  Result Date: 12/12/2021 CLINICAL DATA:  Possible sepsis, altered mental status EXAM: PORTABLE CHEST 1 VIEW COMPARISON:  None Available. FINDINGS: Cardiac size is within normal limits. Lung fields are clear of any infiltrates or pulmonary edema. There is no pleural effusion or pneumothorax. Surgical clips are seen in the left chest wall. IMPRESSION: No active disease. Electronically Signed   By: Elmer Picker M.D.   On: 12/12/2021 19:24    Procedures Procedures    Medications Ordered in ED Medications  lactated ringers infusion (has no administration in time range)  lactated ringers bolus 1,000 mL (1,000 mLs Intravenous Bolus 12/12/21 2103)  haloperidol lactate (HALDOL) injection 2 mg (2 mg Intravenous Given 12/12/21 2208)    ED Course/ Medical Decision Making/ A&P                           Medical Decision Making Amount and/or Complexity of Data Reviewed Labs: ordered. Radiology: ordered. ECG/medicine tests: ordered.  Risk Prescription drug management.   Pt with multiple medical problems and comorbidities and presenting today with a complaint that caries a high risk for morbidity and mortality.  Presenting today  with change of mental status.  Based on patient's most recent neurology note last week she could not carry on meaningful conversation and was having relentless progression of her dementia.  Patient lives at home with her husband however he reports that today he called EMS because she laid down and would not open her eyes or talk to them for over an hour.  He did not report any symptoms suggestive of seizure-like activity.  Patient is awake and alert at this time.  She is noted to move all extremities without any difficulty and continues to try to crawl out of  bed.  Concern for possible infectious etiology causing patient's symptoms.  Temperature here is 99.3 rectal.  Labs are pending. I have independently visualized and interpreted pt's images today. Chest x-ray is within normal limits. 12:02 AM I independently interpreted patient's labs and all labs are within normal limits.  CBC, BMP, lactic acid, COVID, UA are all within normal limits.  Patient's rectal temperature was 99.3 but she has never had documented fever.  Patient became very agitated and would not stay in bed and was given 2 mg of Haldol with significant improvement.  12:02 AM [name] is now present and reports that her mom's mental status has been declining significantly over the last year.  At this time she seems to be at her baseline although sleepy after having the Haldol.  However patient has had multiple falls in the last few weeks and daughter is aware that she did hit her head at least 2 of them.  She has not had any imaging of her brain and we will do a CT to ensure no intracranial hemorrhage.  However all other lab testing is within normal limits.  Findings were discussed with the daughter.  She is happy to take the patient home.  She is working on care during nighttime but she does have caregivers during the day. Head CT without acute findings of intracranial hemorrhage.  Feel that pt is stable for d/c.         Final  Clinical Impression(s) / ED Diagnoses Final diagnoses:  Severe dementia with other behavioral disturbance, unspecified dementia type [facility])    Rx / DC Orders ED Discharge Orders     None         Blanchie Dessert, MD 12/13/21 0002

## 2021-12-12 NOTE — Sepsis Progress Note (Addendum)
Notified provider of need to order antibiotics.  Per MD, does not think patient is septic or requiring antibiotics, will cancel code sepsis order.

## 2021-12-14 LAB — URINE CULTURE

## 2021-12-16 ENCOUNTER — Encounter: Payer: Self-pay | Admitting: Neurology

## 2021-12-18 LAB — CULTURE, BLOOD (ROUTINE X 2)
Culture: NO GROWTH
Culture: NO GROWTH
Special Requests: ADEQUATE

## 2021-12-30 ENCOUNTER — Encounter (INDEPENDENT_AMBULATORY_CARE_PROVIDER_SITE_OTHER): Payer: Medicare PPO | Admitting: Neurology

## 2021-12-30 DIAGNOSIS — F0280 Dementia in other diseases classified elsewhere without behavioral disturbance: Secondary | ICD-10-CM

## 2021-12-30 DIAGNOSIS — G309 Alzheimer's disease, unspecified: Secondary | ICD-10-CM

## 2022-01-04 ENCOUNTER — Telehealth: Payer: Self-pay | Admitting: Neurology

## 2022-01-04 ENCOUNTER — Ambulatory Visit: Payer: Medicare PPO | Admitting: Oncology

## 2022-01-04 ENCOUNTER — Ambulatory Visit: Payer: Medicare PPO

## 2022-01-04 ENCOUNTER — Other Ambulatory Visit: Payer: Medicare PPO

## 2022-01-04 DIAGNOSIS — F0280 Dementia in other diseases classified elsewhere without behavioral disturbance: Secondary | ICD-10-CM

## 2022-01-04 MED ORDER — QUETIAPINE FUMARATE 25 MG PO TABS: 25.0000 mg | ORAL_TABLET | Freq: Every day | ORAL | 6 refills | Status: AC

## 2022-01-04 NOTE — Telephone Encounter (Signed)
I have reviewed medication list, agree to taper off Cymbalta, seroquel '25mg'$  qhs.

## 2022-01-04 NOTE — Telephone Encounter (Signed)
  Thank you for your message seeking medical advice.* My assessment and recommendation are as follows:  Shirley Melendez and Shirley Melendez:  I agree to taper her off Cymbalta if you do not think it is helping her.  I have E prescribed Seroquel to her [facility] on [address].   She is small framed, you may start half tablet 30 minutes before bedtime, if needed, may take 1 even 2 tablet at nighttime.  I hope this is helpful.    Marcial Pacas, MD Ph.D.   *This exchange required the expertise of a doctor, nurse practitioner, physician assistant, optometrist or certified nurse midwife and qualifies as a Medical Advice Message, please visit [email or web address] for more details. [facility] will bill your insurance on your behalf; copays and deductibles may apply. Questions? Reply to this message.

## 2022-01-07 ENCOUNTER — Encounter (HOSPITAL_COMMUNITY): Payer: Self-pay | Admitting: Emergency Medicine

## 2022-01-07 ENCOUNTER — Emergency Department (HOSPITAL_COMMUNITY)
Admission: EM | Admit: 2022-01-07 | Discharge: 2022-01-07 | Disposition: A | Discharge status: Home / Self Care | Payer: Medicare PPO | Attending: Emergency Medicine | Admitting: Emergency Medicine

## 2022-01-07 ENCOUNTER — Other Ambulatory Visit: Payer: Self-pay

## 2022-01-07 ENCOUNTER — Emergency Department (HOSPITAL_COMMUNITY): Payer: Medicare PPO

## 2022-01-07 DIAGNOSIS — S80211A Abrasion, right knee, initial encounter: Secondary | ICD-10-CM | POA: Insufficient documentation

## 2022-01-07 DIAGNOSIS — R404 Transient alteration of awareness: Secondary | ICD-10-CM | POA: Diagnosis not present

## 2022-01-07 DIAGNOSIS — S80212A Abrasion, left knee, initial encounter: Secondary | ICD-10-CM | POA: Diagnosis not present

## 2022-01-07 DIAGNOSIS — Z7401 Bed confinement status: Secondary | ICD-10-CM | POA: Diagnosis not present

## 2022-01-07 DIAGNOSIS — W19XXXA Unspecified fall, initial encounter: Secondary | ICD-10-CM | POA: Diagnosis not present

## 2022-01-07 DIAGNOSIS — I10 Essential (primary) hypertension: Secondary | ICD-10-CM | POA: Diagnosis not present

## 2022-01-07 DIAGNOSIS — S0990XA Unspecified injury of head, initial encounter: Secondary | ICD-10-CM | POA: Diagnosis present

## 2022-01-07 DIAGNOSIS — Z853 Personal history of malignant neoplasm of breast: Secondary | ICD-10-CM | POA: Diagnosis not present

## 2022-01-07 DIAGNOSIS — W06XXXA Fall from bed, initial encounter: Secondary | ICD-10-CM | POA: Insufficient documentation

## 2022-01-07 DIAGNOSIS — F0390 Unspecified dementia without behavioral disturbance: Secondary | ICD-10-CM | POA: Diagnosis not present

## 2022-01-07 DIAGNOSIS — Z23 Encounter for immunization: Secondary | ICD-10-CM | POA: Diagnosis not present

## 2022-01-07 DIAGNOSIS — Z043 Encounter for examination and observation following other accident: Secondary | ICD-10-CM | POA: Diagnosis not present

## 2022-01-07 DIAGNOSIS — S0101XA Laceration without foreign body of scalp, initial encounter: Secondary | ICD-10-CM | POA: Diagnosis not present

## 2022-01-07 DIAGNOSIS — R531 Weakness: Secondary | ICD-10-CM | POA: Diagnosis not present

## 2022-01-07 MED ORDER — TETANUS-DIPHTH-ACELL PERTUSSIS 5-2.5-18.5 LF-MCG/0.5 IM SUSY
0.5000 mL | PREFILLED_SYRINGE | Freq: Once | INTRAMUSCULAR | Status: AC
  Administered 2022-01-07: 0.5 mL via INTRAMUSCULAR
  Filled 2022-01-07: qty 0.5

## 2022-01-07 NOTE — ED Notes (Signed)
R sided head wound cleansed and irrigated

## 2022-01-07 NOTE — ED Provider Notes (Signed)
[facility] DEPT Provider Note: Georgena Spurling, MD, FACEP  CSN: [id number] MRN: [id number] ARRIVAL: 01/07/22 at [place]: [place]  Fall  Level 5 caveat: Dementia HISTORY OF PRESENT ILLNESS  01/07/22 6:54 AM Shirley Melendez Shirley Melendez is a 71 y.o. female with dementia.  She fell off her bed this morning and hit the right side of her head on a table.  She was also noted to have superficial abrasions to both knees.  She is not on anticoagulation.  She states she is having pain in her head and her neck but cannot tell me how severe.   Past Medical History:  Diagnosis Date   Arthritis    chronic neck and back pain   Cervicalgia 04/12/2009   Gets massage once a month; related to arthritis     CTS (carpal tunnel syndrome) 2013   Essential hypertension, benign 07/16/2013   Fasting hyperglycemia 2012   FBS 110   High serum vitamin B12 04/02/2018   History of breast cancer; left 1997 and 2012   Patient diagnosed with invasive ductal carcinoma on 10/14/95 after undergoing excision of left breast mass. Patient then underwent left partial mastectomy on 10/30/95. Pathology showed two foci of invasive ductal carcinoma, grade 1. Patient then underwent a needle biopsy of a left axillary lymph node on 08/13/10 which showed metastatic carcinoma. 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  No other issues.   Neuropathy due to chemotherapeutic drug 02/18/2018   Osteoarthritis 08/24/2008   Neck, back, knee      Osteopenia    PONV (postoperative nausea and vomiting) 1997   no problems since   Prediabetes 12/11/2016    Past Surgical History:  Procedure Laterality Date   AXILLARY NODE DISSECTION  08/2010   left   AXILLARY NODE DISSECTION Left 2012   with axillary dissection on left   BREAST BIOPSY  2012   x2   BREAST LUMPECTOMY  09/1995, 10/1995   left - lumpectomy    BREAST LUMPECTOMY  2012   Lumpectomy Axilla    BREAST SURGERY  2012    axillary node dissection left   CARPAL TUNNEL RELEASE  10/29/2011   Procedure: CARPAL TUNNEL RELEASE;  Surgeon: Cammie Sickle., MD;  Location: [facility];  Service: Orthopedics;  Laterality: Right;   cataract surgery  Bilateral 2020   COLONOSCOPY  2005   negative   DILATION AND CURETTAGE, DIAGNOSTIC / THERAPEUTIC  2002   KNEE SURGERY  2000   left   TRIGGER FINGER RELEASE  10/29/2011   Procedure: RELEASE TRIGGER FINGER/A-1 PULLEY;  Surgeon: Cammie Sickle., MD;  Location: [facility];  Service: Orthopedics;  Laterality: Right;  right long and right index     Family History  Problem Relation Age of Onset   Hypertension Mother    Arthritis Mother        OA   Dementia Mother    Other Father        unknown  medical history   Arthritis Maternal Grandfather        OA   Heart attack Maternal Grandfather 87   Dementia Maternal Grandfather    Heart attack Maternal Uncle 65   Dementia Maternal Grandmother    Stroke Neg Hx    Diabetes Neg Hx    Breast cancer Neg Hx     Social History   Tobacco Use   Smoking status: Never   Smokeless tobacco: Never  Vaping Use   Vaping Use: Never used  Substance Use Topics   Alcohol use: No    Alcohol/week: 0.0 standard drinks of alcohol   Drug use: No    Prior to Admission medications   Medication Sig Start Date End Date Taking? Authorizing Provider  anastrozole (ARIMIDEX) 1 MG tablet TAKE ONE TABLET BY MOUTH DAILY Patient taking differently: Take 1 mg by mouth daily.  02/01/21   Ladell Pier, MD  Calcium Carbonate-Vitamin D (CALCIUM 600 + D PO) Take 1 capsule by mouth 2 (two) times daily. 600/400    [provider]  DULoxetine (CYMBALTA) 60 MG capsule TAKE ONE CAPSULE BY MOUTH DAILY 11/14/21   [name], MD  LYSINE PO Take 500 mg by mouth as needed.    [provider]  Multiple Vitamins-Minerals (SENIOR MULTIVITAMIN PLUS PO) Take by mouth daily.    [provider]  NON FORMULARY Antifungal cream from [place], Historical, MD  QUEtiapine (SEROQUEL) 25 MG tablet Take 1 tablet (25 mg total) by mouth at bedtime. 01/04/22   Marcial Pacas, MD  spironolactone (ALDACTONE) 25 MG tablet TAKE ONE TABLET BY MOUTH DAILY Patient taking differently: Take 25 mg by mouth daily. 11/14/21   Burns, Claudina Lick, MD  UNABLE TO FIND Place 1-2 g onto the skin as needed (peripheral neuropathy). Transdermal Therapeutics (240 gram tube) cream: amantadine 8%, baclofen 2%, gabapentin 6%, amitriptyline 4%, bupivacaine 2%, clonidine 0.2% (1-2 grams every 6-8 hours as needed).    [provider]    Allergies Compazine, Donepezil, Trazodone and nefazodone, Erythromycin, and Prochlorperazine edisylate   REVIEW OF SYSTEMS  Level 5 caveat   PHYSICAL EXAMINATION  Initial Vital Signs Blood pressure 136/78, pulse 96, temperature (!) 97.5 F (36.4 C), temperature source Oral, resp. rate 18, height '5\' 1"'$  (1.549 m), weight 48 kg, SpO2 96 %.  Examination General: Well-developed, well-nourished female in no acute distress; appearance consistent with age of record HENT: normocephalic; laceration anterior to right ear:    Eyes: pupils equal, round and reactive to light; extraocular muscles intact Neck: Immobilized in cervical collar; mild C-spine tenderness Heart: regular rate and rhythm Lungs: clear to auscultation bilaterally Abdomen: soft; nondistended; nontender; bowel sounds present Extremities: No deformity; full range of  motion; pulses normal; superficial abrasions to bilateral knees. Neurologic: Awake, alert, confused; motor function intact in all extremities and symmetric; no facial droop Skin: Warm and dry Psychiatric: Normal mood and affect   RESULTS  Summary of this visit's results, reviewed and interpreted by myself:   EKG Interpretation  Date/Time:    Ventricular Rate:    PR Interval:    QRS Duration:   QT Interval:    QTC Calculation:   R Axis:     Text Interpretation:         Laboratory Studies: No results found for this or any previous visit (from the past 24 hour(s)). Imaging Studies: No results found.  ED COURSE and MDM  Nursing notes, initial and subsequent vitals signs, including  pulse oximetry, reviewed and interpreted by myself.  Vitals:   01/07/22 0626 01/07/22 0627 01/07/22 0631  BP: 136/78 136/78   Pulse: 92 96   Resp: 18    Temp: (!) 97.5 F (36.4 C)    TempSrc: Oral    SpO2: 98% 96%   Weight:   48 kg  Height:   '5\' 1"'$  (1.549 m)   Medications  Tdap (BOOSTRIX) injection 0.5 mL (0.5 mLs Intramuscular Given 01/07/22 0703)   CTs of head and cervical spine ordered to evaluate for cranial or intracranial injury and/or cervical spine fracture.  7:04 AM Signed out to Dr. Regenia Skeeter, who will follow up on head and cervical spine CT scans.  PROCEDURES  Procedures LACERATION REPAIR Performed by: Karen Chafe Myan Locatelli Authorized by: Karen Chafe Kristalyn Bergstresser Consent: Verbal consent obtained. Risks and benefits: risks, benefits and alternatives were discussed Consent given by: patient Patient identity confirmed: provided demographic data Prepped and Draped in normal sterile fashion Wound explored  Laceration Location: Anterior to right ear  Laceration Length: 1.2 cm  No Foreign Bodies seen or palpated  Anesthesia: Not needed  Irrigation method: syringe Amount of cleaning: standard  Skin closure: Dermabond  Patient tolerance: Patient tolerated the procedure well with no  immediate complications.   ED DIAGNOSES     ICD-10-CM   1. Fall from bed, initial encounter  W06.XXXA     2. Laceration of scalp, initial encounter  S01.01XA     3. Abrasion, left knee, initial encounter  S80.212A     4. Abrasion, right knee, initial encounter  F41.423T          Shanon Rosser, MD 01/07/22 [phone]

## 2022-01-07 NOTE — ED Provider Notes (Signed)
Care transferred to me.  CT head and C-spine are unremarkable.  Personally viewed CT head, no head bleed.  Laceration repaired as above.  No obvious extremity trauma.  Will discharge home.   Sherwood Gambler, MD 01/07/22 [phone]

## 2022-01-07 NOTE — Discharge Instructions (Addendum)
If she develops new or worsening headache, confusion, vomiting, or any other new/concerning symptoms then call 911 or return to the ER.

## 2022-01-07 NOTE — ED Notes (Signed)
Fall risk precautions implemented. Yellow grippy socks, fall risk wristband applied, bed alarm activated.

## 2022-01-07 NOTE — ED Notes (Signed)
Bed Alarm on 

## 2022-01-07 NOTE — ED Triage Notes (Signed)
Pt came in with c/o fall and chin lac. No LOC noted. Fell off bed and hit chin on table. Abrasions to bilateral knees. No blood thinners  130/78 98% on RA BGL 128 HR 106 RR 20

## 2022-01-07 NOTE — ED Notes (Signed)
[facility] called to arrange transport back home. °

## 2022-01-08 ENCOUNTER — Encounter: Payer: Self-pay | Admitting: Internal Medicine

## 2022-03-17 ENCOUNTER — Encounter: Payer: Self-pay | Admitting: Neurology

## 2022-03-17 ENCOUNTER — Encounter: Payer: Self-pay | Admitting: Internal Medicine

## 2022-04-15 ENCOUNTER — Ambulatory Visit: Payer: Medicare PPO | Admitting: Neurology

## 2022-08-05 ENCOUNTER — Telehealth: Payer: Self-pay

## 2022-08-05 NOTE — Telephone Encounter (Signed)
Oneal Grout with [facility] is calling in regards to the pt and was wanting to speak with Dr. [name] regarding a APS case that has been started on the pt. Levada Dy was advised to reach out to the pts provider as part of the investigation. Levada Dy states she is needing Dr. [name] to please reach out to her today ASAP.  Levada Dy is not avail from 2:30 and 3:30, her direct number is [phone] (cell).

## 2022-08-05 NOTE — Telephone Encounter (Signed)
Levada Dy is an APS Education officer, museum- returned her call.

## 2022-10-21 ENCOUNTER — Telehealth: Payer: Self-pay | Admitting: Internal Medicine

## 2022-10-21 NOTE — Telephone Encounter (Signed)
Called patient to schedule Medicare Annual Wellness Visit (AWV). Left message for patient to call back and schedule Medicare Annual Wellness Visit (AWV).  Last date of AWV: 11/09/2021  Please schedule an appointment at any time with [facility] .  If any questions, please contact me at [phone].  Thank you ,  Bernice Cicero Care Guide [facility] AWV TEAM Direct Dial: [phone]    

## 2022-11-16 DEATH — deceased

## 2023-06-16 ENCOUNTER — Encounter: Payer: Self-pay | Admitting: Oncology
